# Patient Record
Sex: Male | Born: 1935 | Race: White | Hispanic: No | Smoking: Former smoker
Health system: Southern US, Community
[De-identification: ages and names within clinical notes are randomized; demographics above are authoritative.]

## PROBLEM LIST (undated history)

## (undated) DIAGNOSIS — K635 Polyp of colon: Secondary | ICD-10-CM

## (undated) DIAGNOSIS — M1990 Unspecified osteoarthritis, unspecified site: Secondary | ICD-10-CM

## (undated) DIAGNOSIS — K219 Gastro-esophageal reflux disease without esophagitis: Secondary | ICD-10-CM

## (undated) DIAGNOSIS — H532 Diplopia: Secondary | ICD-10-CM

## (undated) DIAGNOSIS — K649 Unspecified hemorrhoids: Secondary | ICD-10-CM

## (undated) DIAGNOSIS — N400 Enlarged prostate without lower urinary tract symptoms: Secondary | ICD-10-CM

## (undated) HISTORY — DX: Benign prostatic hyperplasia without lower urinary tract symptoms: N40.0

## (undated) HISTORY — DX: Unspecified hemorrhoids: K64.9

## (undated) HISTORY — DX: Gastro-esophageal reflux disease without esophagitis: K21.9

## (undated) HISTORY — PX: HEMORRHOID SURGERY: SHX153

## (undated) HISTORY — DX: Polyp of colon: K63.5

## (undated) HISTORY — DX: Diplopia: H53.2

## (undated) HISTORY — DX: Unspecified osteoarthritis, unspecified site: M19.90

---

## 1992-06-16 HISTORY — PX: HERNIA REPAIR: SHX51

## 2001-06-16 HISTORY — PX: TOTAL KNEE ARTHROPLASTY: SHX125

## 2004-07-31 ENCOUNTER — Ambulatory Visit: Payer: Self-pay | Admitting: Family Medicine

## 2004-08-02 ENCOUNTER — Ambulatory Visit: Payer: Self-pay | Admitting: Internal Medicine

## 2004-11-19 ENCOUNTER — Ambulatory Visit: Payer: Self-pay | Admitting: Internal Medicine

## 2004-12-06 ENCOUNTER — Ambulatory Visit: Payer: Self-pay | Admitting: Internal Medicine

## 2005-01-28 ENCOUNTER — Ambulatory Visit: Payer: Self-pay | Admitting: Internal Medicine

## 2005-02-05 ENCOUNTER — Ambulatory Visit: Payer: Self-pay

## 2005-06-25 ENCOUNTER — Inpatient Hospital Stay (HOSPITAL_COMMUNITY): Admission: RE | Admit: 2005-06-25 | Discharge: 2005-06-29 | Payer: Self-pay | Admitting: Orthopedic Surgery

## 2005-11-24 ENCOUNTER — Ambulatory Visit: Payer: Self-pay | Admitting: Internal Medicine

## 2005-12-15 ENCOUNTER — Ambulatory Visit: Payer: Self-pay | Admitting: Internal Medicine

## 2006-08-03 ENCOUNTER — Ambulatory Visit: Payer: Self-pay | Admitting: Internal Medicine

## 2006-08-10 ENCOUNTER — Ambulatory Visit: Payer: Self-pay | Admitting: Internal Medicine

## 2006-08-10 LAB — CONVERTED CEMR LAB
Basophils Absolute: 0.1 10*3/uL (ref 0.0–0.1)
Basophils Relative: 1.5 % — ABNORMAL HIGH (ref 0.0–1.0)
CO2: 31 meq/L (ref 19–32)
Chloride: 106 meq/L (ref 96–112)
Creatinine, Ser: 0.8 mg/dL (ref 0.4–1.5)
Eosinophils Absolute: 0.1 10*3/uL (ref 0.0–0.6)
GFR calc Af Amer: 119 mL/min
Glucose, Bld: 101 mg/dL — ABNORMAL HIGH (ref 70–99)
HCT: 44.5 % (ref 39.0–52.0)
Hemoglobin: 15.5 g/dL (ref 13.0–17.0)
MCHC: 34.8 g/dL (ref 30.0–36.0)
MCV: 97.1 fL (ref 78.0–100.0)
Monocytes Absolute: 0.3 10*3/uL (ref 0.2–0.7)
Platelets: 212 10*3/uL (ref 150–400)
Potassium: 3.9 meq/L (ref 3.5–5.1)
RDW: 12.2 % (ref 11.5–14.6)
Sodium: 143 meq/L (ref 135–145)
WBC: 7.7 10*3/uL (ref 4.5–10.5)

## 2006-08-11 ENCOUNTER — Encounter: Payer: Self-pay | Admitting: Internal Medicine

## 2006-08-13 ENCOUNTER — Ambulatory Visit: Payer: Self-pay | Admitting: Internal Medicine

## 2007-02-23 ENCOUNTER — Ambulatory Visit: Payer: Self-pay | Admitting: Internal Medicine

## 2007-02-23 DIAGNOSIS — E739 Lactose intolerance, unspecified: Secondary | ICD-10-CM

## 2007-02-23 DIAGNOSIS — N400 Enlarged prostate without lower urinary tract symptoms: Secondary | ICD-10-CM

## 2007-02-23 HISTORY — DX: Lactose intolerance, unspecified: E73.9

## 2007-02-23 HISTORY — DX: Benign prostatic hyperplasia without lower urinary tract symptoms: N40.0

## 2007-02-24 ENCOUNTER — Telehealth (INDEPENDENT_AMBULATORY_CARE_PROVIDER_SITE_OTHER): Payer: Self-pay | Admitting: *Deleted

## 2007-03-26 ENCOUNTER — Ambulatory Visit: Payer: Self-pay | Admitting: Gastroenterology

## 2007-05-04 ENCOUNTER — Encounter: Payer: Self-pay | Admitting: Gastroenterology

## 2007-05-04 ENCOUNTER — Ambulatory Visit: Payer: Self-pay | Admitting: Gastroenterology

## 2007-05-04 ENCOUNTER — Encounter: Payer: Self-pay | Admitting: Internal Medicine

## 2007-05-04 DIAGNOSIS — D126 Benign neoplasm of colon, unspecified: Secondary | ICD-10-CM

## 2007-05-04 HISTORY — DX: Benign neoplasm of colon, unspecified: D12.6

## 2007-08-26 DIAGNOSIS — K649 Unspecified hemorrhoids: Secondary | ICD-10-CM

## 2007-08-26 DIAGNOSIS — M129 Arthropathy, unspecified: Secondary | ICD-10-CM

## 2007-08-26 DIAGNOSIS — K5730 Diverticulosis of large intestine without perforation or abscess without bleeding: Secondary | ICD-10-CM | POA: Insufficient documentation

## 2007-08-26 HISTORY — DX: Arthropathy, unspecified: M12.9

## 2007-08-26 HISTORY — DX: Diverticulosis of large intestine without perforation or abscess without bleeding: K57.30

## 2007-08-26 HISTORY — DX: Unspecified hemorrhoids: K64.9

## 2007-12-22 ENCOUNTER — Ambulatory Visit: Payer: Self-pay | Admitting: Internal Medicine

## 2008-01-18 ENCOUNTER — Ambulatory Visit: Payer: Self-pay | Admitting: Internal Medicine

## 2008-04-24 ENCOUNTER — Ambulatory Visit: Payer: Self-pay | Admitting: Internal Medicine

## 2008-04-26 ENCOUNTER — Telehealth (INDEPENDENT_AMBULATORY_CARE_PROVIDER_SITE_OTHER): Payer: Self-pay | Admitting: *Deleted

## 2008-04-26 LAB — CONVERTED CEMR LAB
BUN: 12 mg/dL (ref 6–23)
CO2: 31 meq/L (ref 19–32)
Calcium: 9.3 mg/dL (ref 8.4–10.5)
Creatinine, Ser: 0.9 mg/dL (ref 0.4–1.5)
Hemoglobin: 17 g/dL (ref 13.0–17.0)
Hgb A1c MFr Bld: 5.6 % (ref 4.6–6.0)
PSA: 0.88 ng/mL (ref 0.10–4.00)
Potassium: 4.4 meq/L (ref 3.5–5.1)
Sodium: 141 meq/L (ref 135–145)

## 2009-06-27 ENCOUNTER — Ambulatory Visit: Payer: Self-pay | Admitting: Internal Medicine

## 2009-06-27 DIAGNOSIS — R9431 Abnormal electrocardiogram [ECG] [EKG]: Secondary | ICD-10-CM

## 2009-06-27 HISTORY — DX: Abnormal electrocardiogram (ECG) (EKG): R94.31

## 2009-06-28 ENCOUNTER — Encounter: Payer: Self-pay | Admitting: Internal Medicine

## 2009-06-28 LAB — CONVERTED CEMR LAB
Bacteria, UA: NONE SEEN
Casts: NONE SEEN /lpf

## 2009-06-29 ENCOUNTER — Ambulatory Visit: Payer: Self-pay | Admitting: Internal Medicine

## 2009-06-29 LAB — CONVERTED CEMR LAB
Basophils Absolute: 0 10*3/uL (ref 0.0–0.1)
Basophils Relative: 0.1 % (ref 0.0–3.0)
Eosinophils Absolute: 0.2 10*3/uL (ref 0.0–0.7)
Lymphocytes Relative: 52.7 % — ABNORMAL HIGH (ref 12.0–46.0)
Lymphs Abs: 3.2 10*3/uL (ref 0.7–4.0)
MCV: 100 fL (ref 78.0–100.0)
Monocytes Absolute: 0.5 10*3/uL (ref 0.1–1.0)
Neutro Abs: 2.2 10*3/uL (ref 1.4–7.7)
Platelets: 160 10*3/uL (ref 150.0–400.0)

## 2009-07-04 ENCOUNTER — Telehealth (INDEPENDENT_AMBULATORY_CARE_PROVIDER_SITE_OTHER): Payer: Self-pay | Admitting: *Deleted

## 2009-07-04 LAB — CONVERTED CEMR LAB
BUN: 18 mg/dL (ref 6–23)
CO2: 21 meq/L (ref 19–32)
Calcium: 9.3 mg/dL (ref 8.4–10.5)
Cholesterol: 171 mg/dL (ref 0–200)
Glucose, Bld: 77 mg/dL (ref 70–99)
HDL: 41 mg/dL (ref >39)
LDL Cholesterol: 109 mg/dL — ABNORMAL HIGH (ref 0–99)
Sodium: 140 meq/L (ref 135–145)
Triglycerides: 104 mg/dL (ref <150)
VLDL: 21 mg/dL (ref 0–40)

## 2009-07-12 ENCOUNTER — Encounter: Payer: Self-pay | Admitting: Internal Medicine

## 2009-09-11 ENCOUNTER — Telehealth (INDEPENDENT_AMBULATORY_CARE_PROVIDER_SITE_OTHER): Payer: Self-pay | Admitting: *Deleted

## 2009-10-01 ENCOUNTER — Ambulatory Visit: Payer: Self-pay | Admitting: Internal Medicine

## 2009-12-18 ENCOUNTER — Ambulatory Visit: Payer: Self-pay | Admitting: Internal Medicine

## 2009-12-18 DIAGNOSIS — R42 Dizziness and giddiness: Secondary | ICD-10-CM

## 2009-12-18 HISTORY — DX: Dizziness and giddiness: R42

## 2010-07-14 LAB — CONVERTED CEMR LAB
Bilirubin Urine: NEGATIVE
Bilirubin Urine: NEGATIVE
Blood in Urine, dipstick: NEGATIVE
Calcium: 9.3 mg/dL (ref 8.4–10.5)
Chloride: 105 meq/L (ref 96–112)
Creatinine, Ser: 0.8 mg/dL (ref 0.4–1.5)
Eosinophils Absolute: 0.2 10*3/uL (ref 0.0–0.6)
GFR calc Af Amer: 119 mL/min
GFR calc non Af Amer: 98 mL/min
Glucose, Bld: 94 mg/dL (ref 70–99)
Glucose, Urine, Semiquant: NEGATIVE
HCT: 46.1 % (ref 39.0–52.0)
HDL: 35.2 mg/dL — ABNORMAL LOW (ref >39.0)
Hemoglobin: 16 g/dL (ref 13.0–17.0)
Hgb A1c MFr Bld: 5.7 % (ref 4.6–6.0)
Ketones, urine, test strip: NEGATIVE
Ketones, urine, test strip: NEGATIVE
LDL Cholesterol: 114 mg/dL — ABNORMAL HIGH (ref 0–99)
MCHC: 34.7 g/dL (ref 30.0–36.0)
Monocytes Relative: 6.7 % (ref 3.0–11.0)
Nitrite: NEGATIVE
PSA: 0.87 ng/mL (ref 0.10–4.00)
Potassium: 4.3 meq/L (ref 3.5–5.1)
Protein, U semiquant: NEGATIVE
RBC: 4.67 M/uL (ref 4.22–5.81)
Sodium: 141 meq/L (ref 135–145)
Specific Gravity, Urine: 1.01
TSH: 2.17 microintl units/mL (ref 0.35–5.50)
Total CHOL/HDL Ratio: 5.2
VLDL: 33 mg/dL (ref 0–40)
WBC Urine, dipstick: NEGATIVE
WBC: 6.4 10*3/uL (ref 4.5–10.5)

## 2010-07-18 NOTE — Assessment & Plan Note (Signed)
Summary: BLOOD PRESSURE IS HIGH//KN   Vital Signs:  Patient profile:   77 year old male Height:      72 inches Weight:      217.50 pounds BMI:     29.60 Pulse rate:   118 / minute Pulse rhythm:   regular BP sitting:   134 / 88  (left arm) Cuff size:   large  Vitals Entered By: Army Fossa CMA (December 18, 2009 2:03 PM) CC: BP has been elevated Comments Last pm- 154/86 This am- 161/91   History of Present Illness: last week, had a single episode of mild dizziness after he stood up from bed. Symptoms quickly subsided but since then he is just not feeling 100% right. He cannot be more specific. At the time of the dizzy spell, he did not have nausea, vomiting, slurred speech or diplopia No further symptoms like that He has been taking his BPs lately, and it has been as high as 161/91.  ROS No chest pain or palpitation No near syncope filling No nausea vomiting or blood in the stools He did have some constipation that self resolve.  Allergies: 1)  ! Pcn  Past History:  Past Medical History: Reviewed history from 06/27/2009 and no changes required. COLONIC POLYPS per Cscope 11-08 ARTHRITIS  HEMORRHOIDS  HEMOCCULT POSITIVE STOOL  IMPAIRED GLUCOSE TOLERANCE  BPH  Past Surgical History: Reviewed history from 04/24/2008 and no changes required. Hemorrhoidectomy Total Knee Arthroplasty L  Social History: Reviewed history from 10/01/2009 and no changes required. Married 3 children, lost older son Retired, upholstery-furniture walks 2 miles a day , very engaged in his church place golf  one- twice a week, he is a very happy person tobacco-- quit 1985 , (pipe, cigars) ETOH-- no  Review of Systems      See HPI  Physical Exam  General:  alert, well-developed, and well-nourished.   Neck:  normal carotid pulses bilaterally Lungs:  normal respiratory effort, no intercostal retractions, no accessory muscle use, and normal breath sounds.   Heart:  tachycardic, seems  regular Abdomen:  soft, non-tender, and no distention.   Extremities:  no edema Neurologic:  alert & oriented X3, cranial nerves II-XII intact, and strength normal in all extremities.   Speech fluent and coherent   Impression & Recommendations:  Problem # 1:  DIZZINESS AND GIDDINESS (ICD-780.4)  nonspecific dizziness last week , very mild, neurological exam negative. EKG today showed sinus rhythm, no change compared to previous Plan: Observation, to call if BP > 140/85 consistently, low salt diet (which he started already)  Orders: EKG w/ Interpretation (93000)  Complete Medication List: 1)  Asa 81 Mg  .[name].. 1 qd 2)  Vitamin C 500 Mg  .[name].. 1 qd 3)  Saw Palmetto  .[name].. 1 qd 4)  Tylenol Ex St Arthritis Pain 500 Mg Tabs (Acetaminophen)  Patient Instructions: 1)  Please schedule a follow-up appointment in 4 months .

## 2010-07-18 NOTE — Assessment & Plan Note (Signed)
Summary: 3 MTH FU/NS/KDC   Vital Signs:  Patient profile:   77 year old male Height:      72 inches Weight:      221.8 pounds Pulse rate:   84 / minute BP sitting:   122 / 80  Vitals Entered By: Kandice Hams (October 01, 2009 8:57 AM) CC: rov, not fasting   History of Present Illness:  routine office visit feels great  Current Medications (verified): 1)  Asa 81 Mg .[name].. 1 Qd 2)  Vitamin C 500 Mg .[name].. 1 Qd 3)  Saw Palmetto .[name].. 1 Qd 4)  Tylenol Ex St Arthritis Pain 500 Mg  Tabs (Acetaminophen)  Allergies (verified): 1)  ! Pcn  Past History:  Past Medical History: Reviewed history from 06/27/2009 and no changes required. COLONIC POLYPS per Cscope 11-08 ARTHRITIS  HEMORRHOIDS  HEMOCCULT POSITIVE STOOL  IMPAIRED GLUCOSE TOLERANCE  BPH  Past Surgical History: Reviewed history from 04/24/2008 and no changes required. Hemorrhoidectomy Total Knee Arthroplasty L  Social History: Married 3 children, lost older son Retired, upholstery-furniture walks 2 miles a day , very engaged in his church place golf  one- twice a week, he is a very happy person tobacco-- quit 1985 , (pipe, cigars) ETOH-- no  Review of Systems       he had BPH symptoms, he was prescribed antibiotics and after that he felt completely well he also saw urology, note is reviewed , they feel that he did not need any treatment. denies chest pain, shortness of breath or palpitations desires a handicapped sticker, he had a total left knee replacement, since then he wakes up often with left leg numbness distal from the knee.  That difficult his gait sometimes  Physical Exam  General:  alert and well-developed.   Lungs:  normal respiratory effort, no intercostal retractions, no accessory muscle use, and normal breath sounds.   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 1 qd 2)  Vitamin C 500 Mg  .[name].. 1 qd 3)  Saw Palmetto  .[name].. 1 qd 4)  Tylenol Ex St Arthritis Pain 500 Mg Tabs (Acetaminophen)  Patient Instructions: 1)  Please schedule a follow-up appointment in 4 to 6 months .

## 2010-07-18 NOTE — Consult Note (Signed)
Summary: [facility]  [facility]   Imported By: Lanelle Bal 07/23/2009 13:48:41  _____________________________________________________________________  External Attachment:    Type:   Image     Comment:   External Document

## 2010-07-18 NOTE — Progress Notes (Signed)
Summary: waiting on signature, 1/21  Phone Note Call from Patient Call back at [facility] Phone [phone]   Caller: Patient Summary of Call: PATIENT BROUGHT IN HANDICAP RENEWAL FORM --PLEASE COMPLETE AND MAIL BACK TO HIM IN THE ENCLOSED ENVELOPE  PLEASE CALL HIM TO TELL HIM WHEN IT HAS BEEN MAILED  TOOK PAPERWORK TO [facility] IN PLASTIC SLEEVE Initial call taken by: Jerolyn Shin,  July 04, 2009 10:33 AM  Follow-up for Phone Call        form in Dr. Drue Novel office to complete [facility]  July 06, 2009 3:52 PM form completed, pt picked up form today Shary Decamp  July 13, 2009 9:36 AM

## 2010-07-18 NOTE — Assessment & Plan Note (Signed)
Summary: yearly checkup/NS/KDC   Vital Signs:  Patient profile:   77 year old male Height:      72 inches Weight:      223 pounds Pulse rate:   60 / minute BP sitting:   130 / 78  (left arm) Cuff size:   large  Vitals Entered By: Shary Decamp (June 27, 2009 12:43 PM) CC: yearly - not fasting Is Patient Diabetic? No Comments  - urinary urgency, frequency  - wakes up 1-2x @ night to urinate  - pt is unable to hold urine Shary Decamp  June 27, 2009 12:47 PM    History of Present Illness: yearly, chart is reviewed  ARTHRITIS -- no major issue w/ pain at present, uses tylenol sporadicaly   IMPAIRED GLUCOSE TOLERANCE  follows a healthy diet, wife has DM exercise : walks 2 miles a day weather permit no ambulatory CBGs   BPH c/o sudden urges  to urinate, urgency, frequency wakes up 1-2x @ night to urinate pt is unable to hold urine has not taken meds for this issues   Preventive Screening-Counseling & Management  Alcohol-Tobacco     Alcohol drinks/day: 0     Smoking Status: quit, smoked pipes, cigars     Year Quit: 1985  Caffeine-Diet-Exercise     Caffeine use/day: 2     Does Patient Exercise: yes, daily  Current Medications (verified): 1)  Asa 81 Mg .[name].. 1 Qd 2)  Vitamin C 500 Mg .[name].. 1 Qd 3)  Saw Palmetto .[name].. 1 Qd 4)  Tylenol Ex St Arthritis Pain 500 Mg  Tabs (Acetaminophen)  Allergies (verified): 1)  ! Pcn  Past History:  Past Medical History: COLONIC POLYPS per Cscope 11-08 ARTHRITIS  HEMORRHOIDS  HEMOCCULT POSITIVE STOOL  IMPAIRED GLUCOSE TOLERANCE  BPH  Past Surgical History: Reviewed history from 04/24/2008 and no changes required. Hemorrhoidectomy Total Knee Arthroplasty L  Family History: CAD--no MI--no DM--no breast ca--M colon cancer--no prostate ca--no  Social History: Reviewed history from 04/24/2008 and no changes required. Married 3 children, lost older son Retired, upholstery-furniture walks 2 miles a day    tobacco-- quit 1985 , (pipe, cigars) ETOH-- no Smoking Status:  quit, smoked pipes, cigars Does Patient Exercise:  yes, daily Caffeine use/day:  2  Review of Systems General:  Denies fever and weight loss. CV:  Denies chest pain or discomfort and swelling of feet. Resp:  Denies cough and shortness of breath. GI:  Denies bloody stools, diarrhea, nausea, and vomiting. GU:  Denies hematuria. Psych:  Denies anxiety and depression.  Physical Exam  General:  alert and well-developed.  alert and well-developed.   Neck:  no masses and normal carotid upstroke.  no masses and normal carotid upstroke.   Lungs:  normal respiratory effort, no intercostal retractions, and no accessory muscle use.  normal respiratory effort, no intercostal retractions, and no accessory muscle use.    Heart:  no murmur and no gallop.  regular rate? Bigeminy? Abdomen:  soft, non-tender, no distention, no masses, no guarding, and no rigidity.  no bruit Rectal:  No external abnormalities noted. Normal sphincter tone. No rectal masses or tenderness. 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 (ICD-600.00) complaining of urinary symptoms, more than obstruction they  seem to be irritative  in nature today the prostate seems enlarged on the left plan:  urology referral UA and urine culture, PSA   antibiotics  Orders: UA Dipstick w/o Micro (manual) (54098) Specimen Handling (99000) T-Urine Microscopic (11914-78295) T-Culture, Urine (62130-86578) Urology Referral (Urology)  Problem # 2:  HEALTH SCREENING (ICD-V70.0) Td 06 had shingles shot 2008 @ [facility] per patient  provided  a pneumonia shot 01-2008 rec flu shot, benefits discussed, "I will let you know"   had a normal bone  density test on March 2006  Cscope 11-08,(+) polyp   Problem # 3:  IMPAIRED GLUCOSE TOLERANCE (ICD-271.3) has a healthy lifestyle, check labs  Problem # 4:  NONSPECIFIC ABNORMAL ELECTROCARDIOGRAM (ICD-794.31) frequent extra systoles, will discuss with cardiology patient asymptomatic, no syncope or near syncope addendum: Discussed with cardiology, as long as the patient is asymptomatic we can observe.  He if patient has symptoms, he will need an echo Orders: EKG w/ Interpretation (93000)  Complete Medication List: 1)  Asa 81 Mg  .[name].. 1 qd 2)  Vitamin C 500 Mg  .[name].. 1 qd 3)  Saw Palmetto  .[name].. 1 qd 4)  Tylenol Ex St Arthritis Pain 500 Mg Tabs (Acetaminophen) 5)  Levaquin 250 Mg Tabs (Levofloxacin) .... 2 tablets the first day, then 1 tablet daily for an additional 9 days  Patient Instructions: 1)  come back fasting: 2)  FLP, TSH   Dx   (ICD-794.31) 3)  CBC Dx (ICD-211.3) 4)  A1C,  BMP  Dx  (ICD-271.3) 5)  PSA Dx  (ICD-600.00) 6)  Please schedule a follow-up appointment in 3 months .  Prescriptions: LEVAQUIN 250 MG TABS (LEVOFLOXACIN) 2 tablets the first day, then 1 tablet daily for an additional 9 days  #11 x 0   Entered and Authorized by:   Nolon Rod. Atalaya Zappia MD   Signed by:   Nolon Rod. Petar Mucci MD on 06/27/2009   Method used:   Print then Give to Patient   RxID:   [id number]    Not Administered:    Influenza Vaccine not given due to: declined    Preventive Care Screening  Prior Values:    PSA:  0.88 (04/24/2008)    Last Tetanus Booster:  Historical (07/31/2004)    Last Pneumovax:  Pneumovax (01/18/2008)   Laboratory Results   Urine Tests    Routine Urinalysis   Glucose: negative   (Normal Range: Negative) Bilirubin: negative   (Normal Range: Negative) Ketone: negative   (Normal Range: Negative) Spec. Gravity: 1.020   (Normal Range: 1.003-1.035) Blood: negative   (Normal Range: Negative) pH: 5.0   (Normal Range: 5.0-8.0) Protein: negative   (Normal Range:  Negative) Urobilinogen: 0.2   (Normal Range: 0-1) Nitrite: negative   (Normal Range: Negative) Leukocyte Esterace: negative   (Normal Range: Negative)

## 2010-07-18 NOTE — Progress Notes (Signed)
Summary: HANDICAP PLACARD FORM TO COMPLETE  Phone Note Call from Patient Call back at Home Phone [phone]   Caller: Spouse Summary of Call: PATIENT'S WIFE DROPPED OFF HANDICAP PLACARD RENEWAL FORM----PLEASE CALL AT [phone] WHEN READY FOR PICKUP  WILL TAKE FORM TO [facility] IN PLASTIC SLEEVE Initial call taken by: Jerolyn Shin,  September 11, 2009 10:07 AM  Follow-up for Phone Call        Spoke with pt wife informed pt has ov 10/01/09 with Dr Drue Novel, can assess and fill out then,  Placard expires 11/13/2009  wife agreed .Kandice Hams  September 11, 2009 10:29 AM  Follow-up by: Kandice Hams,  September 11, 2009 10:29 AM

## 2010-07-18 NOTE — Consult Note (Signed)
Summary: Page 3/[facility]  Page 3/[facility]   Imported By: Lanelle Bal 08/01/2009 13:21:22  _____________________________________________________________________  External Attachment:    Type:   Image     Comment:   External Document

## 2010-09-26 ENCOUNTER — Ambulatory Visit (INDEPENDENT_AMBULATORY_CARE_PROVIDER_SITE_OTHER): Payer: Medicare Other | Admitting: Internal Medicine

## 2010-09-26 ENCOUNTER — Encounter: Payer: Self-pay | Admitting: Internal Medicine

## 2010-09-26 VITALS — BP 118/72 | HR 94 | Temp 98.5°F | Wt 222.4 lb

## 2010-09-26 DIAGNOSIS — M25519 Pain in unspecified shoulder: Secondary | ICD-10-CM

## 2010-09-26 DIAGNOSIS — M25512 Pain in left shoulder: Secondary | ICD-10-CM

## 2010-09-26 MED ORDER — TRAMADOL HCL 50 MG PO TABS: 50.0000 mg | ORAL_TABLET | Freq: Four times a day (QID) | ORAL | Status: DC | PRN

## 2010-09-26 NOTE — Patient Instructions (Signed)
Please use the tramadol every 6 hours as needed for pain. Continue the [facility] at night. The ibuprofen could cause significant gastritis or even ulcer if taken on an empty stomach, especially at night. Please take glucosamine sulfate 1500 mg daily until the shoulder pain is gone. If the symptoms persist then orthopedic referral or physical therapy referral  will be made.

## 2010-09-26 NOTE — Progress Notes (Signed)
Subjective:    Patient ID: Kevin Glass, male    DOB: 30-May-1923, 77 y.o.   MRN: [id number]  HPI    Location: over the L shoulder @ neck level  Onset: 7 days ago  Severity: 8 on 10 scale Pain is described as: aching  Worse with: no exacerbating  Position or activity Better with: NSAIDS & Icy Hot help overnight  Pain radiates to: occasionally to  L upper arm  Impaired range of motion: no  History of repetitive motion:  no  History of overuse or hyperextension:  no  History of trauma:  no   Past history of similar problem:  no   Symptoms Back Pain:  no  Numbness/tingling:  no  Weakness:  no  Red Flags Fever:  no  Headache:  no  Bowel/bladder dysfunction:  no Rash or lesions : no     Review of Systems      Objective:   Physical Exam General appearance is one of good health and nourishment.Appearsyounger than age Skull is normocephalic without lymphadenopathy about the head, neck, or axilla. See current vital signs Eye - Pupils Equal Round Reactive to light, Extraocular movements intact,Neck:  No deformities, thyromegaly, masses, or tenderness noted.   Supple with full range of motion without pain. Heart:  Normal rate and regular rhythm. S1 and S2 normal without gallop, murmur, click, rub or other extra sounds. Lungs:Chest clear to auscultation; no wheezes, rhonchi,rales ,or rubs present.No increased work of breathing. Crepitus L shoulder with ROM. Full ROM of  UE. No skin lesions L shoulder area.Increased lordotic curve upper thoracic spine. Neurologic exam : Cn 2-7 intact Strength equal & normal in upper & lower extremities Gait normal.  Assessment & Plan: #1 left shoulder pain with some crepitus. There is no definite neuromuscular deficit noted. Plan: #1 tramadol will be prescribed for the shoulder. If the symptoms persist then physical therapy or orthopedic referral would be recommended.  Plan #1

## 2010-10-29 NOTE — Assessment & Plan Note (Signed)
[name]                         GASTROENTEROLOGY OFFICE NOTE   Kevin Glass, Kevin Glass                       MRN:          [id number]  DATE:03/26/2007                            DOB:          1922-12-08    REASON FOR CONSULTATION:  Screening colonoscopy.   Kevin Glass is a pleasant 77 year old white male referred through the  courtesy of Dr. Drue Novel for evaluation.  Except for occasional hemorrhoids  causing rectal discomfort, he has no GI complaints.  Specifically, he is  without change of bowel habits, abdominal pain, melena, or hematochezia.   PAST MEDICAL HISTORY:  Pertinent for:  1. Arthritis.  2. He is status post knee replacement.  3. Herniorrhaphy.   FAMILY HISTORY:  Pertinent for mother with breast cancer.   He takes a baby aspirin a day.   He is allergic to PENICILLIN.   He neither smokes nor drinks.  He is married and retired.   REVIEW OF SYSTEMS:  Reviewed and is negative.   PHYSICAL EXAMINATION:  VITAL SIGNS:  Pulse 100, blood pressure 120/80,  weight 228.  HEENT: EOMI.  PERRLA.  Sclerae are anicteric.  Conjunctivae are pink.  NECK:  Supple without thyromegaly, adenopathy or carotid bruits.  CHEST:  Clear to auscultation and percussion without adventitious  sounds.  CARDIAC:  Regular rhythm; normal S1 S2.  There are no murmurs, gallops  or rubs.  ABDOMEN:  Bowel sounds are normoactive.  Abdomen is soft, nontender and  nondistended.  There are no abdominal masses, tenderness, splenic  enlargement or hepatomegaly.  EXTREMITIES:  Full range of motion.  No cyanosis, clubbing or edema.  RECTAL:  Deferred.   ASSESSMENT/PLAN:  I will schedule Kevin Glass for screening colonoscopy.     Kevin Glass. Kevin Dice, MD,FACG  Electronically Signed   RDK/MedQ  DD: 03/26/2007  DT: 03/26/2007  Job #: [id number]   cc:   Kevin Ora, MD

## 2010-10-29 NOTE — Letter (Signed)
March 26, 2007    Mr. Vega Withrow   RE:  RILEY, HALLUM  MRN:  [id number]  /  DOB:  1922/09/28   Dear Mr. Juline Patch:   It is my pleasure to have treated you recently as a new patient in my  office.  I appreciate your confidence and the opportunity to participate  in your care.   Since I do have a busy inpatient endoscopy schedule and office schedule,  my office hours vary weekly.  I am, however, available for emergency  calls every day through my office.  If I cannot promptly meet an urgent  office appointment, another one of our gastroenterologists will be able  to assist you.   My well-trained staff are prepared to help you at all times.  For  emergencies after office hours, a physician from our gastroenterology  section is always available through my 24-hour answering service.   While you are under my care, I encourage discussion of your questions  and concerns, and I will be happy to return your calls as soon as I am  available.   Once again, I welcome you as a new patient and I look forward to a happy  and healthy relationship.    Sincerely,      Barbette Hair. Arlyce Dice, MD,FACG  Electronically Signed   RDK/MedQ  DD: 03/26/2007  DT: 03/26/2007  Job #: [id number]

## 2010-10-29 NOTE — Letter (Signed)
March 26, 2007    Willow Ora, MD  [phone] [place], [place] [address]   RE:  [name], [name]  MRN:  [id number]  /  DOB:  [birth year]   Dear Dr. Drue Novel:   Upon your kind referral, I had the pleasure of evaluating your patient  and I am pleased to offer my findings.  I saw Kevin Glass in the  office today.  Enclosed is a copy of my progress note that details my  findings and recommendations.   Thank you for the opportunity to participate in your patient's care.    Sincerely,      Barbette Hair. Arlyce Dice, MD,FACG  Electronically Signed    RDK/MedQ  DD: 03/26/2007  DT: 03/26/2007  Job #: [id number]

## 2010-11-01 NOTE — H&P (Signed)
NAMERILEY, HALLUM NO.:  [id number]   MEDICAL RECORD NO.:  [id number]          PATIENT TYPE:  INP   LOCATION:  NA                           FACILITY:  [facility]   PHYSICIAN:  Ollen Gross, M.D.    DATE OF BIRTH:  1923-05-03   DATE OF ADMISSION:  06/25/2005  DATE OF DISCHARGE:                                HISTORY & PHYSICAL   DATE OF OFFICE VISIT HISTORY AND PHYSICAL:  June 17, 2005.   CHIEF COMPLAINT:  Left knee pain.   HISTORY OF PRESENT ILLNESS:  The patient is an 77 year old male seen by Dr.  Lequita Halt for ongoing left knee problems.  He has been treated conservatively  in the past including injections and a recent series of Hyalgan injections.  Unfortunately, he did not get much benefit from the injections.  He is at a  point where he has known end-stage arthritis.  He is unable to do some of  the things he enjoys.  He is felt to be a candidate for knee replacement.  The risks and benefits have been discussed.  The patient is subsequently  admitted to the hospital.   ALLERGIES:  PENICILLIN which causes blisters.   CURRENT MEDICATIONS:  1.  Glucosamine.  2.  Vitamins.  3.  Aspirin.  4.  Saw palmetto.  5.  Arthritis pain reliever.   PAST MEDICAL HISTORY:  1.  Past history of walking pneumonia back in 1985.  2.  Osteoarthritis.   PAST SURGICAL HISTORY:  1.  Inguinal hernia repair in 1975.  2.  Surface hernia repair in 1978.   SOCIAL HISTORY:  Married.  Retired.  Nonsmoker.  No alcohol.  Two living  children; one deceased.  Wife will be assisting with his care after surgery.   FAMILY HISTORY:  Significant for breast cancer with mother.   REVIEW OF SYSTEMS:  GENERAL:  No fevers, chills or night sweats.  NEUROLOGIC:  No seizures, syncope or paralysis.  RESPIRATORY:  No shortness  of breath, cough or hemoptysis.  CARDIOVASCULAR:  No chest pain, angina or  orthopnea.  GI:  No nausea, vomiting, diarrhea or constipation.  GU:  No  dysuria, hematuria  or discharge.  MUSCULOSKELETAL:  Left knee as found in  the history of present illness.   PHYSICAL EXAMINATION:  VITAL SIGNS:  Pulse of 88, respirations 14, blood  pressure 112/82.  GENERAL:  An 76 year old white male, well-nourished, well-developed, in no  acute distress.  He is alert, oriented and cooperative and very pleasant at  the time of the exam.  He is a good historian.  HEENT:  Normocephalic and atraumatic.  Pupils round and reactive.  Oropharynx clear.  Extraocular movements intact.  NECK:  Supple.  CHEST:  Clear, anterior and posterior chest walls.  No rhonchi, rales or  wheezing.  HEART:  Regular rate and rhythm.  No murmurs.  ABDOMEN:  Soft, nontender.  Bowel sounds present.  RECTAL/BREAST/GENITALIA:  Not done.  Not pertinent to present illness.  EXTREMITIES:  Left knee shows passive range of motion up to 115 degrees.  Mild crepitus is noted.  He  has a varus deformity and malalignment.   IMPRESSION:  Osteoarthritis, left knee.   PLAN:  The patient will be admitted to [facility] to undergo a  left total hip arthroplasty.  The surgery will be performed by Dr. Ollen Gross.  Medical doctor, Dr. Drue Novel, will be notified of the room number on  admission and will be consulted if needed for medical assistance for the  patient throughout the hospital course.      Alexzandrew L. Julien Girt, P.A.      Ollen Gross, M.D.  Electronically Signed    ALP/MEDQ  D:  06/24/2005  T:  06/25/2005  Job:  [id number]   cc:   Wanda Plump, MD [facility]  [phone] [place], [place] [address]

## 2010-11-01 NOTE — Discharge Summary (Signed)
[name], REWERTS NO.:  [id number]   MEDICAL RECORD NO.:  [id number]          PATIENT TYPE:  INP   LOCATION:  1515                         FACILITY:  [facility]   PHYSICIAN:  Ollen Gross, M.D.    DATE OF BIRTH:  06-Aug-1922   DATE OF ADMISSION:  06/25/2005  DATE OF DISCHARGE:  06/29/2005                                 DISCHARGE SUMMARY   ADMISSION DIAGNOSES:  1.  Osteoarthritis left knee.  2.  History of walking pneumonia back in 1985.   DISCHARGE DIAGNOSES:  1.  Osteoarthritis left knee status post left total knee arthroplasty.  2.  History of walking pneumonia back in 1985.  3.  Postoperative hyponatremia.   PROCEDURE:  June 25, 2005 left total knee, surgeon Dr. Lequita Halt, surgeon  Avel Peace, PA-C, spinal anesthesia. Tourniquet time 43 minutes.   CONSULTATIONS:  None.   BRIEF HISTORY:  Mr. Kevin Glass is an 77 year old male with severe end-stage  arthritis left greater than right and knee pain. It has been refractory to  nonoperative management including injections and now presents for left total  knee.   LABORATORY DATA:  Preop CBC, hemoglobin of 16.8, hematocrit of 49.4. Postop  hemoglobin of 14.1, last noted H&H 12.3 and 34.9. PT and PTT preop, 14.1 and  32 respectively. Serial pro times followed. Last noted PT and INR 22.6 and  2.0. Chem panel on admission all within normal limits. Serial BMETs are  followed. Sodium dropped from 141 to 133, last noted at 133. Glucose went up  from 111 to 154 back down to 139. The remaining electrolytes remained within  normal limits. Urinalysis preop negative, blood group type O positive. Preop  EKG June 23, 2005 normal sinus rhythm, possible left atrial enlargement,  no previous EKG is confirmed by Dr. Dawson Bing. Two view chest preop,  heart size prominent with dilatation of aorta, thought to be scarring in the  right upper lobe and both lower lobes. Comparison of any previous chest x-  ray would be  suggested.   HOSPITAL COURSE:  The patient admitted to [facility], tolerated  procedure well, later transferred to the recovery room and then the  orthopedic floor. Started on PCA and p.o. analgesics for pain control. Day  1, he was actually doing pretty well, was able to get some rest through that  night, started to get up with physical therapy, dressing looked good. By day  2, he progressed very well with physical therapy ambulating greater than 90  feet. Hemoglobin looked good at 13.1, dressing was changed, incision looked  excellent. He was progressing well and felt that he would be able to go home  very soon. Weaned over to p.o. medications. By day 3, he was feeling well,  tolerating his medications, up ambulating approximately 160 to nearly 200  feet but he had some sluggish bowel sounds. Encouraged continued therapy. By  the following day, he had been up with therapy, tolerating his meds, no  complaints, minimal swelling and was discharged home.   PLAN:  1.  The patient was discharged home on June 29, 2005.  2.  Discharge diagnoses - please see above.  3.  Discharge medications - Coumadin, Percocet, Robaxin.  4.  Diet - as tolerated.  5.  Activity - weightbearing as tolerated. Gait training, ambulation, ADLs,      as per PT and OT, home health physical therapy, home health nursing.      Total knee protocol. Followup 2 weeks from surgery.   DISPOSITION:  Home.   CONDITION ON DISCHARGE:  Improved.      Kevin Glass, P.A.      Ollen Gross, M.D.  Electronically Signed    ALP/MEDQ  D:  08/14/2005  T:  08/15/2005  Job:  [id number]   cc:   Wanda Plump, MD [facility]  [phone] [place], [place] [address]

## 2010-11-01 NOTE — Op Note (Signed)
[name], Glass NO.:  [id number]   MEDICAL RECORD NO.:  [id number]          PATIENT TYPE:  INP   LOCATION:  0006                         FACILITY:  [facility]   PHYSICIAN:  Ollen Gross, M.D.    DATE OF BIRTH:  06-27-1922   DATE OF PROCEDURE:  06/25/2005  DATE OF DISCHARGE:                                 OPERATIVE REPORT   PREOPERATIVE DIAGNOSIS:  Osteoarthritis of the left knee.   POSTOPERATIVE DIAGNOSIS:  Osteoarthritis of the left knee.   PROCEDURE:  Left total knee arthroplasty.   SURGEON:  Ollen Gross, M.D.   ASSISTANT:  Alexzandrew L. Julien Girt, P.A.   ANESTHESIA:  Spinal.   ESTIMATED BLOOD LOSS:  Minimal.   DRAINS:  Hemovac x1.   TOURNIQUET TIME:  43 minutes at 300 mmHg.   COMPLICATIONS:  None.   CONDITION:  Stable to recovery.   BRIEF CLINICAL NOTE:  Kevin Glass is an 77 year old male with severe end-  stage arthritis, left greater than right knee with intractable pain in the  left knee which has been refractory to nonoperative management including  injections. He presents now for left total knee arthroplasty.   PROCEDURE IN DETAIL:  After successful administration of spinal anesthetic,  a tourniquet was placed high on the left thigh and left lower extremity  prepped and draped in the usual sterile fashion. Extremities wrapped in  Esmarch, knee flexed, tourniquet inflated to 300 mmHg. A standard midline  incision is made with a 10 blade through the subcutaneous tissue to the  level of the extensor mechanism. A fresh blade is used to make a medial  parapatellar arthrotomy and then the soft tissue over the proximal medial  tibia is subperiosteally elevated to the joint line with the knife and into  the semimembranosus bursa with a Cobb elevator. The soft tissue over the  proximal lateral tibia is elevated with attention being paid to avoiding the  patellar tendon on the tibial tubercle. The patella is subluxed laterally,  knee flexed 90  degrees and ACL and PCL removed. A drill was used to create a  starting hole in the distal femur and canal was irrigated. A 5 degree left  valgus alignment guide is placed and referencing off the posterior condyles,  rotation is marked and the block pinned to remove 10 mm off the distal  femur. Distal femoral resection is made with an oscillating saw. A sizing  block shows that a size 5 is the most appropriate. The rotation is marked  off the epicondylar axis and then the size 5 cutting block is placed. The  anterior, posterior and chamfer cuts are made.   The tibia is subluxed forward and the menisci removed. Extramedullary tibial  alignment guide is placed referencing proximally at the medial aspect of the  tibial tubercle and distally along the second metatarsal axis and tibial  crest. The block is pinned to remove 10 mm from the nondeficient lateral  side. This did not get down to the base of the medial defect so we went down  below that. The tibial resection is made with an oscillating  saw. A size 5  is the most appropriate tibial component and then the proximal tibia is  prepared with a modular drill and keel punch for a size five. Femoral  preparation is completed with the intercondylar cut.   The size 5 mobile bearing tibial trial with the size 5 posterior stabilized  femoral trial and a 12.5 mm posterior stabilized rotating platform insert  trial are placed. With the 12.5, we still had a tiny bit of hyperextension  so I went to a 15 which allowed for full extension with excellent varus and  valgus balance throughout full range of motion. The patella was then  everted, thickness measured to be 25 mm. Freehand resection is taken to 14  mm, a 41 template is placed, lug holes are drilled, trial patella is placed  and it tracks normally. Osteophytes are then removed off the posterior femur  with the trial in place. All trials are removed and the cut bone surfaces  prepared with  pulsatile lavage. The cement is mixed and once ready for  implantation the size 5 mobile bearing tibial tray, size 5 posterior  stabilized femur and 41 patella are cemented into place and the patella is  held with a clamp. A trial 15 insert is placed, knee held in full extension  and all extruded cement removed. Once the cement is fully hardened then the  permanent 15 mm posterior stabilized rotating platform insert is placed into  the tibial tray. The wound was copiously irrigated with saline solution and  the extensor mechanism closed over a Hemovac drain with interrupted #1 PDS.  Flexion against gravity is 140 degrees. The tourniquet is released with a  total time of 43 minutes. The subcu is closed with interrupted 2-0 Vicryl  and subcuticular with running 4-0 Monocryl. The drain is hooked to suction,  incision cleaned and dried and Steri-Strips and a bulky sterile dressing  applied. He is placed into a knee immobilizer, awakened and transported to  recovery in stable condition.      Ollen Gross, M.D.  Electronically Signed     FA/MEDQ  D:  06/25/2005  T:  06/26/2005  Job:  [id number]

## 2010-11-21 ENCOUNTER — Ambulatory Visit (INDEPENDENT_AMBULATORY_CARE_PROVIDER_SITE_OTHER): Payer: Medicare Other | Admitting: Internal Medicine

## 2010-11-21 ENCOUNTER — Encounter: Payer: Self-pay | Admitting: Internal Medicine

## 2010-11-21 DIAGNOSIS — J069 Acute upper respiratory infection, unspecified: Secondary | ICD-10-CM | POA: Insufficient documentation

## 2010-11-21 HISTORY — DX: Acute upper respiratory infection, unspecified: J06.9

## 2010-11-21 MED ORDER — AZELASTINE HCL 0.15 % NA SOLN: NASAL | Status: DC

## 2010-11-21 NOTE — Patient Instructions (Signed)
Rest, fluids , tylenol For cough, take Mucinex DM twice a day as needed  Take astepro 2 sprays at each side of the nose at night x few days (sample and a prescription) Call if no better in few days Call anytime if the symptoms are severe, you have high fever, short of breath

## 2010-11-21 NOTE — Progress Notes (Signed)
  Subjective:    Patient ID: Tsuneo Faison, male    DOB: 04-08-1923, 77 y.o.   MRN: [id number]  HPI Started with sore throat 3 days ago, symptoms are now progressing and he has head and sinus congestion, postnasal drip, denies any chest congestion. Sore throat is actually slightly better today.  Past Medical History  Diagnosis Date  . Colonic polyp   . Arthritis   . Hemorrhoid   . BPH (benign prostatic hypertrophy)    Past Surgical History  Procedure Date  . Total knee arthroplasty 2003    Dr Despina Hick  . Hemorrhoid surgery      Review of Systems  Constitutional: Negative for fever.  Respiratory: Cough: mild cough, small sputum, no hemoptysis.   Gastrointestinal: Negative for nausea, vomiting and diarrhea.  Musculoskeletal: Negative for myalgias.       Objective:   Physical Exam  Constitutional: He appears well-developed and well-nourished. No distress.  HENT:  Head: Normocephalic and atraumatic.  Right Ear: External ear normal.  Left Ear: External ear normal.  Mouth/Throat: No oropharyngeal exudate.       Not tender to palpation of the face ;nose congested   Cardiovascular: Normal rate, regular rhythm and normal heart sounds.   No murmur heard. Pulmonary/Chest: Effort normal and breath sounds normal. No respiratory distress. He has no wheezes. He has no rales.  Skin: He is not diaphoretic.          Assessment & Plan:

## 2010-11-21 NOTE — Assessment & Plan Note (Signed)
Symptoms consistent with a URI, seen instructions

## 2010-11-29 ENCOUNTER — Emergency Department (HOSPITAL_BASED_OUTPATIENT_CLINIC_OR_DEPARTMENT_OTHER)
Admission: EM | Admit: 2010-11-29 | Discharge: 2010-11-29 | Disposition: A | Discharge status: Home / Self Care | Payer: Medicare Other | Attending: Emergency Medicine | Admitting: Emergency Medicine

## 2010-11-29 ENCOUNTER — Emergency Department (INDEPENDENT_AMBULATORY_CARE_PROVIDER_SITE_OTHER): Payer: Medicare Other

## 2010-11-29 DIAGNOSIS — I517 Cardiomegaly: Secondary | ICD-10-CM

## 2010-11-29 DIAGNOSIS — R05 Cough: Secondary | ICD-10-CM

## 2010-11-29 DIAGNOSIS — R0989 Other specified symptoms and signs involving the circulatory and respiratory systems: Secondary | ICD-10-CM

## 2010-11-29 DIAGNOSIS — J069 Acute upper respiratory infection, unspecified: Secondary | ICD-10-CM | POA: Insufficient documentation

## 2010-12-23 ENCOUNTER — Ambulatory Visit (INDEPENDENT_AMBULATORY_CARE_PROVIDER_SITE_OTHER): Payer: Medicare Other | Admitting: Family Medicine

## 2010-12-23 ENCOUNTER — Encounter: Payer: Self-pay | Admitting: Family Medicine

## 2010-12-23 VITALS — BP 116/80 | HR 103 | Temp 99.4°F | Wt 217.0 lb

## 2010-12-23 DIAGNOSIS — J069 Acute upper respiratory infection, unspecified: Secondary | ICD-10-CM

## 2010-12-23 NOTE — Progress Notes (Signed)
  Subjective:     Kevin Glass is a 77 y.o. male who presents for evaluation of symptoms of a URI. Symptoms include post nasal drip and sneezing. Onset of symptoms was 1 month ago, and has been unchanged since that time. Treatment to date: antibiotics and cough suppressants.  The following portions of the patient's history were reviewed and updated as appropriate: allergies, current medications, past family history, past medical history, past social history, past surgical history and problem list.  Review of Systems Pertinent items are noted in HPI.   Objective:    BP 116/80  Pulse 103  Temp(Src) 99.4 F (37.4 C) (Oral)  Wt 217 lb (98.431 kg)  SpO2 96% General appearance: alert and cooperative Ears: normal TM's and external ear canals both ears Nose: Nares normal. Septum midline. Mucosa normal. No drainage or sinus tenderness. Throat: abnormal findings: mild oropharyngeal erythema and pnd Neck: mild anterior cervical adenopathy and thyroid not enlarged, symmetric, no tenderness/mass/nodules Lungs: clear to auscultation bilaterally   Assessment:    viral upper respiratory illness   Plan:    Discussed diagnosis and treatment of URI. Suggested symptomatic OTC remedies. Nasal saline spray for congestion. Follow up as needed. astepro

## 2010-12-23 NOTE — Patient Instructions (Signed)
Common Cold, Adult An upper respiratory tract infection, or cold, is a viral infection of the air passages to the lung. Colds are contagious, especially during the first 3 or 4 days. Antibiotics cannot cure a cold. Cold germs are spread by coughs, sneezes, and hand to hand contact. A respiratory tract infection usually clears up in a few days, but some people may be sick for a week or two. HOME CARE INSTRUCTIONS  Only take over-the-counter or prescription medicines for pain, discomfort, or fever as directed by your caregiver.   Be careful not to blow your nose too hard. This may cause a nosebleed.   Use a cool-mist humidifier (vaporizer) to increase air moisture. This will make it easier for you to breath. Do not use hot steam.   Rest as much as possible and get plenty of sleep.   Wash your hands often, especially after you blow your nose. Cover your mouth and nose with a tissue when you sneeze or cough.   Drink at least 8 glasses of clear liquids every day, such as water, fruit juices, tea, clear soups, and carbonated beverages.  SEEK MEDICAL CARE IF:  An oral temperature above 100.4 lasts 4 days or more, and is not controlled by medication.   You have a sore throat that gets worse or you see white or yellow spots in your throat.   Your cough gets worse or lasts more than 10 days.   You have a rash somewhere on your skin. You have large and tender lumps in your neck.   You have an earache or a headache.   You have thick, greenish or yellowish discharge from your nose.   You cough-up thick yellow, green, gray or bloody mucus (secretions).  SEEK IMMEDIATE MEDICAL CARE IF: You have trouble breathing, chest pain, or your skin or nails look gray or blue. MAKE SURE YOU:   Understand these instructions.   Will watch your condition.   Will get help right away if you are not doing well or get worse.  Document Released: 05/30/2000 Document Re-Released: 05/15/2008 [facility] Patient  Information 2011 [place], [place].  OK to take otc antihistamine like claritin , zyrtec for 10 days or so. Start astepro again and call or return to office if symptoms persist.

## 2011-05-19 ENCOUNTER — Telehealth: Payer: Self-pay | Admitting: Internal Medicine

## 2011-05-19 NOTE — Telephone Encounter (Signed)
Ok with me 

## 2011-05-19 NOTE — Telephone Encounter (Signed)
My practice is closed; the doctors will continue to cross cover for each other as a team in case of absence however.

## 2011-05-20 NOTE — Telephone Encounter (Signed)
Patient notified of dr hoppers response -

## 2011-05-20 NOTE — Telephone Encounter (Signed)
Left message to call office

## 2011-08-04 ENCOUNTER — Ambulatory Visit (INDEPENDENT_AMBULATORY_CARE_PROVIDER_SITE_OTHER): Payer: Medicare Other | Admitting: Family Medicine

## 2011-08-04 ENCOUNTER — Encounter: Payer: Self-pay | Admitting: Family Medicine

## 2011-08-04 VITALS — BP 112/70 | HR 103 | Temp 98.6°F | Wt 226.6 lb

## 2011-08-04 DIAGNOSIS — J40 Bronchitis, not specified as acute or chronic: Secondary | ICD-10-CM

## 2011-08-04 DIAGNOSIS — R05 Cough: Secondary | ICD-10-CM

## 2011-08-04 DIAGNOSIS — J069 Acute upper respiratory infection, unspecified: Secondary | ICD-10-CM

## 2011-08-04 DIAGNOSIS — R059 Cough, unspecified: Secondary | ICD-10-CM

## 2011-08-04 MED ORDER — AZELASTINE-FLUTICASONE 137-50 MCG/ACT NA SUSP: 1.0000 | Freq: Two times a day (BID) | NASAL | Status: DC

## 2011-08-04 MED ORDER — GUAIFENESIN-CODEINE 100-10 MG/5ML PO SYRP: ORAL_SOLUTION | ORAL | Status: DC

## 2011-08-04 MED ORDER — AZITHROMYCIN 250 MG PO TABS: ORAL_TABLET | ORAL | Status: AC

## 2011-08-04 NOTE — Progress Notes (Signed)
  Subjective:     Shanan Mcmiller is a 78 y.o. male who presents for evaluation of sinus pain. Symptoms include: clear rhinorrhea, congestion, cough, nasal congestion and sneezing. Onset of symptoms was 4 days ago. Symptoms have been gradually worsening since that time. Past history is significant for no history of pneumonia or bronchitis. Patient is a non-smoker.  The following portions of the patient's history were reviewed and updated as appropriate: allergies, current medications, past family history, past medical history, past social history, past surgical history and problem list.  Review of Systems Pertinent items are noted in HPI.   Objective:    BP 112/70  Pulse 103  Temp(Src) 98.6 F (37 C) (Oral)  Wt 226 lb 9.6 oz (102.785 kg)  SpO2 95% General appearance: alert, cooperative, appears stated age and no distress Ears: normal TM's and external ear canals both ears Nose: Nares normal. Septum midline. Mucosa normal. No drainage or sinus tenderness. Throat: lips, mucosa, and tongue normal; teeth and gums normal Neck: mild anterior cervical adenopathy, supple, symmetrical, trachea midline and thyroid not enlarged, symmetric, no tenderness/mass/nodules Lungs: rhonchi RLL Heart: S1, S2 normal    Assessment:    Acute bronchitis   Plan:    Nasal steroids per medication orders. Zithromax per medication orders. cheratussin  rto prn

## 2011-08-04 NOTE — Patient Instructions (Signed)

## 2011-09-18 ENCOUNTER — Other Ambulatory Visit: Payer: Self-pay | Admitting: Internal Medicine

## 2011-09-18 NOTE — Telephone Encounter (Signed)
OK to send in refill for nasal spray & z-pak? Please advise.

## 2011-09-18 NOTE — Telephone Encounter (Signed)
Discussed with pt & scheduled appt for tomorrow w/ Dr. Laury Axon.

## 2011-09-18 NOTE — Telephone Encounter (Signed)
No abx without being seen---ok to send others

## 2011-09-18 NOTE — Telephone Encounter (Signed)
Patient called & states he has the same issues as he did on 2.18 & would like the Nasal Spray  Azelastine-Fluticasone 137-50 MCG/ACT Place 1 spray into the nose 2 (two) times daily. & Z-pac (azithromycin (ZITHROMAX Z-PAK) 250 MG tablet 6 each 0 08/04/2011 08/09/2011 As directed)   called in [facility] to [facility] [address] Can call patient at [phone]

## 2011-09-19 ENCOUNTER — Encounter: Payer: Self-pay | Admitting: Family Medicine

## 2011-09-19 ENCOUNTER — Ambulatory Visit (INDEPENDENT_AMBULATORY_CARE_PROVIDER_SITE_OTHER): Payer: Medicare Other | Admitting: Family Medicine

## 2011-09-19 VITALS — BP 114/74 | HR 84 | Temp 98.3°F | Wt 219.8 lb

## 2011-09-19 DIAGNOSIS — J40 Bronchitis, not specified as acute or chronic: Secondary | ICD-10-CM

## 2011-09-19 MED ORDER — AZITHROMYCIN 250 MG PO TABS: ORAL_TABLET | ORAL | Status: DC

## 2011-09-19 MED ORDER — GUAIFENESIN-CODEINE 100-10 MG/5ML PO SYRP: ORAL_SOLUTION | ORAL | Status: DC

## 2011-09-19 NOTE — Patient Instructions (Signed)

## 2011-09-19 NOTE — Progress Notes (Signed)
  Subjective:     Kevin Glass is a 78 y.o. male here for evaluation of a cough. Onset of symptoms was 5 days ago. Symptoms have been gradually worsening since that time. The cough is productive and is aggravated by dust, exercise and reclining position. Associated symptoms include: chills, postnasal drip and wheezing. Patient does not have a history of asthma. Patient does not have a history of environmental allergens. Patient has not traveled recently. Patient does not have a history of smoking. Patient has not had a previous chest x-ray. Patient has not had a PPD done.  The following portions of the patient's history were reviewed and updated as appropriate: allergies, current medications, past family history, past medical history, past social history, past surgical history and problem list.  Review of Systems Pertinent items are noted in HPI.    Objective:    Oxygen saturation 97% on room air BP 114/74  Pulse 84  Temp(Src) 98.3 F (36.8 C) (Oral)  Wt 219 lb 12.8 oz (99.701 kg)  SpO2 97% General appearance: alert, cooperative, appears stated age and no distress Ears: normal TM's and external ear canals both ears Nose: clear discharge, mild congestion, turbinates red, swollen, no sinus tenderness Throat: lips, mucosa, and tongue normal; teeth and gums normal Neck: mild anterior cervical adenopathy, supple, symmetrical, trachea midline and thyroid not enlarged, symmetric, no tenderness/mass/nodules Lungs: clear to auscultation bilaterally Extremities: extremities normal, atraumatic, no cyanosis or edema    Assessment:    Acute Bronchitis    Plan:    Antibiotics per medication orders. Antitussives per medication orders. Avoid exposure to tobacco smoke and fumes. Call if shortness of breath worsens, blood in sputum, change in character of cough, development of fever or chills, inability to maintain nutrition and hydration. Avoid exposure to tobacco smoke and fumes. Trial of  steroid nasal spray. cxr next week if needed

## 2011-09-20 ENCOUNTER — Emergency Department (INDEPENDENT_AMBULATORY_CARE_PROVIDER_SITE_OTHER): Payer: Medicare Other

## 2011-09-20 ENCOUNTER — Emergency Department (HOSPITAL_BASED_OUTPATIENT_CLINIC_OR_DEPARTMENT_OTHER)
Admission: EM | Admit: 2011-09-20 | Discharge: 2011-09-20 | Disposition: A | Discharge status: Home / Self Care | Payer: Medicare Other | Attending: Emergency Medicine | Admitting: Emergency Medicine

## 2011-09-20 ENCOUNTER — Encounter (HOSPITAL_BASED_OUTPATIENT_CLINIC_OR_DEPARTMENT_OTHER): Payer: Self-pay | Admitting: *Deleted

## 2011-09-20 DIAGNOSIS — J479 Bronchiectasis, uncomplicated: Secondary | ICD-10-CM

## 2011-09-20 DIAGNOSIS — R05 Cough: Secondary | ICD-10-CM

## 2011-09-20 DIAGNOSIS — R599 Enlarged lymph nodes, unspecified: Secondary | ICD-10-CM | POA: Insufficient documentation

## 2011-09-20 DIAGNOSIS — R590 Localized enlarged lymph nodes: Secondary | ICD-10-CM

## 2011-09-20 DIAGNOSIS — M47814 Spondylosis without myelopathy or radiculopathy, thoracic region: Secondary | ICD-10-CM | POA: Insufficient documentation

## 2011-09-20 DIAGNOSIS — R0989 Other specified symptoms and signs involving the circulatory and respiratory systems: Secondary | ICD-10-CM

## 2011-09-20 DIAGNOSIS — K209 Esophagitis, unspecified without bleeding: Secondary | ICD-10-CM | POA: Insufficient documentation

## 2011-09-20 DIAGNOSIS — R059 Cough, unspecified: Secondary | ICD-10-CM

## 2011-09-20 DIAGNOSIS — R918 Other nonspecific abnormal finding of lung field: Secondary | ICD-10-CM

## 2011-09-20 DIAGNOSIS — J189 Pneumonia, unspecified organism: Secondary | ICD-10-CM

## 2011-09-20 LAB — DIFFERENTIAL
Basophils Absolute: 0 10*3/uL (ref 0.0–0.1)
Eosinophils Relative: 3 % (ref 0–5)
Lymphs Abs: 3.1 10*3/uL (ref 0.7–4.0)
Monocytes Absolute: 0.5 10*3/uL (ref 0.1–1.0)
Monocytes Relative: 7 % (ref 3–12)

## 2011-09-20 LAB — CBC
HCT: 42.8 % (ref 39.0–52.0)
Hemoglobin: 14.9 g/dL (ref 13.0–17.0)
MCH: 33.3 pg (ref 26.0–34.0)
Platelets: 125 10*3/uL — ABNORMAL LOW (ref 150–400)
WBC: 6.8 10*3/uL (ref 4.0–10.5)

## 2011-09-20 LAB — BASIC METABOLIC PANEL
Calcium: 9 mg/dL (ref 8.4–10.5)
Chloride: 101 mEq/L (ref 96–112)
GFR calc Af Amer: >90 mL/min (ref >90)
GFR calc non Af Amer: 79 mL/min — ABNORMAL LOW (ref >90)

## 2011-09-20 MED ORDER — ALBUTEROL SULFATE HFA 108 (90 BASE) MCG/ACT IN AERS
2.0000 | INHALATION_SPRAY | RESPIRATORY_TRACT | Status: DC | PRN
  Administered 2011-09-20: 2 via RESPIRATORY_TRACT
  Filled 2011-09-20: qty 6.7

## 2011-09-20 MED ORDER — PREDNISONE 50 MG PO TABS
60.0000 mg | ORAL_TABLET | Freq: Once | ORAL | Status: AC
  Administered 2011-09-20: 60 mg via ORAL
  Filled 2011-09-20: qty 1

## 2011-09-20 MED ORDER — DOXYCYCLINE MONOHYDRATE 150 MG PO CAPS: 150.0000 mg | ORAL_CAPSULE | Freq: Two times a day (BID) | ORAL | Status: AC

## 2011-09-20 MED ORDER — ALBUTEROL SULFATE (5 MG/ML) 0.5% IN NEBU
5.0000 mg | INHALATION_SOLUTION | Freq: Once | RESPIRATORY_TRACT | Status: AC
  Administered 2011-09-20: 5 mg via RESPIRATORY_TRACT
  Filled 2011-09-20: qty 1

## 2011-09-20 MED ORDER — PREDNISONE 10 MG PO TABS: 20.0000 mg | ORAL_TABLET | Freq: Every day | ORAL | Status: DC

## 2011-09-20 MED ORDER — IOHEXOL 300 MG/ML  SOLN
80.0000 mL | Freq: Once | INTRAMUSCULAR | Status: AC | PRN
  Administered 2011-09-20: 80 mL via INTRAVENOUS

## 2011-09-20 MED ORDER — IPRATROPIUM BROMIDE 0.02 % IN SOLN
0.5000 mg | Freq: Once | RESPIRATORY_TRACT | Status: AC
  Administered 2011-09-20: 0.5 mg via RESPIRATORY_TRACT
  Filled 2011-09-20: qty 2.5

## 2011-09-20 NOTE — ED Notes (Signed)
Pt states he has had cough, congestion since Wed. Saw Dr. On Friday. Given Z-pak, Rob AC and Dymista without relief.

## 2011-09-20 NOTE — ED Provider Notes (Signed)
History    This chart was scribed for Kevin Quarry, MD, MD by Smitty Pluck. The patient was seen in room MH04 and the patient's care was started at 5:05PM.   CSN: [id number]  Arrival date & time 09/20/11  1629   First MD Initiated Contact with Patient 09/20/11 1652      Chief Complaint  Patient presents with  . Cough    (Consider location/radiation/quality/duration/timing/severity/associated sxs/prior treatment) The history is provided by the patient.   Kevin Glass is a 78 y.o. male who presents to the Emergency Department complaining of moderate cough onset 3 days ago. The symptoms have been constant since onset without radiation. Pt reports that he went to Dr. Laury Axon 1 day ago. Pt thinks that he has fever. Denies productive cough. He reports intermittent nasal drip. Pt has had past respiratory infections (pneumonia). Denies nausea, vomiting and diarrhea. Pt takes tylenol and saw palmetto every 2 days, 85 mg aspirin. He quit smoking 1989. Pt has hx total knee arthroplasty.   Past Medical History  Diagnosis Date  . Colonic polyp   . Arthritis   . Hemorrhoid   . BPH (benign prostatic hypertrophy)     Past Surgical History  Procedure Date  . Total knee arthroplasty 2003    Dr Despina Hick  . Hemorrhoid surgery     History reviewed. No pertinent family history.  History  Substance Use Topics  . Smoking status: Former Smoker    Quit date: 06/17/1983  . Smokeless tobacco: Not on file  . Alcohol Use: No      Review of Systems  All other systems reviewed and are negative.   10 Systems reviewed and all are negative for acute change except as noted in the HPI.   Allergies  Penicillins  Home Medications   Current Outpatient Rx  Name Route Sig Dispense Refill  . ACETAMINOPHEN 500 MG PO TABS Oral Take 500 mg by mouth every other day.     [name] VITAMIN C 500 MG PO TABS Oral Take 1,000 mg by mouth daily.     . ASPIRIN 81 MG PO TABS Oral Take 81 mg by mouth every other day.      . AZELASTINE-FLUTICASONE 137-50 MCG/ACT NA SUSP Nasal Place 1 spray into the nose 2 (two) times daily. 1 Bottle 0  . AZITHROMYCIN 250 MG PO TABS  As directed 6 tablet 0  . GUAIFENESIN-CODEINE 100-10 MG/5ML PO SYRP  1-2 tsp po qhs prn cough 120 mL 0  . GUAIFENESIN-CODEINE 100-10 MG/5ML PO SYRP  1-2 tsp po qhs prn cough 120 mL 0  . SAW PALMETTO 500 MG PO CAPS Oral Take 1 capsule by mouth every other day.       BP 150/71  Pulse 90  Temp(Src) 98.5 F (36.9 C) (Oral)  Resp 20  Ht 6' (1.829 m)  Wt 215 lb (97.523 kg)  BMI 29.16 kg/m2  SpO2 99%  Physical Exam  Nursing note and vitals reviewed. Constitutional: He is oriented to person, place, and time. He appears well-developed and well-nourished. No distress.  HENT:  Head: Normocephalic and atraumatic.  Mouth/Throat: Oropharynx is clear and moist.  Neck: Normal range of motion. Neck supple.  Cardiovascular: Normal rate, regular rhythm and normal heart sounds.   Pulmonary/Chest: Effort normal. No respiratory distress. He has wheezes (throughout ).  Abdominal: Soft. He exhibits no distension. There is no tenderness.  Neurological: He is alert and oriented to person, place, and time.  Skin: Skin is warm and dry.  Psychiatric: He has a normal mood and affect. His behavior is normal.    ED Course  Procedures (including critical care time) DIAGNOSTIC STUDIES: Oxygen Saturation is 99% on room air, normal by my interpretation.    COORDINATION OF CARE: EDP ordered medication: Albuterol 0.5%, atrovent 0.5 mg, deltazone 60 mg   5:56PM EDP Rechecks pt. EDP discusses lab results with pt.  Labs Reviewed - No data to display Dg Chest 2 View  09/20/2011  *RADIOLOGY REPORT*  Clinical Data: Cough and congestion  CHEST - 2 VIEW  Comparison: 11/29/2010  Findings: Prominent right hilum.  Right perihilar and right lower lobe airspace disease is present with mild progression from the prior study.  This may represent scarring however underlying mass  lesion cannot be excluded.  Further evaluation with CT scanning is suggested.  Mild scarring in the left lung base is similar.  No heart failure or effusion.  IMPRESSION: Prominent right hilum.  Increase in right perihilar and right lower lobe airspace disease.  These findings may all be due to scarring however underlying mass lesion cannot be excluded.  CT chest with contrast is suggested.  Original Report Authenticated By: Camelia Phenes, M.D.   Ct Chest W Contrast  09/20/2011  *RADIOLOGY REPORT*  Clinical Data: Chest congestion.  Cough.  Chest radiograph for revealed a prominent right hilum.  CT CHEST WITH CONTRAST  Technique:  Multidetector CT imaging of the chest was performed following the standard protocol during bolus administration of intravenous contrast.  Contrast: 80mL OMNIPAQUE IOHEXOL 300 MG/ML  SOLN  Comparison: 09/20/2011 chest radiograph  Findings: A right hilar lymph node measures 1.2 cm in short axis, abnormally enlarged.  However, much of the hilar prominence shown on chest radiography is due to prominent vasculature.  No cardiomegaly is observed.  There is faint calcification along the aortic valve, a calcification is also present in the left main, left anterior descending, and circumflex coronary arteries.  Right paratracheal lymph nodes measure up to 1.0 cm in short axis. There is mild wall thickening of the distal esophagus.  Scattered bands of interstitial accentuation are present in the lungs, especially peripherally.  There is some mild volume loss in the right lower lobe associated with cylindrical bronchiectasis.  Mild confluent scarring is noted in the right lower lobe.  There is mild nodularity along the minor fissure.  Thoracic spondylosis noted.  IMPRESSION:  1.  Peripheral interstitial accentuation in the lungs, query usual interstitial pneumonitis.  There is mild bronchiectasis in the right lower lobe along with scattered band-like scarring. 2.  Mild right hilar adenopathy with a  right hilar lymph node has a short axis diameter 1.2 cm.  This could be inflammatory/reactive or neoplastic. 2.  Borderline prominent right paratracheal lymph nodes. 3.  Wall thickening of the distal esophagus, suggesting esophagitis. 4.  Thoracic spondylosis. 5.  Coronary atherosclerosis.  Original Report Authenticated By: Dellia Cloud, M.D.     No diagnosis found. Results for orders placed during the hospital encounter of 09/20/11  BASIC METABOLIC PANEL      Component Value Range   Sodium 138  135 - 145 (mEq/L)   Potassium 3.9  3.5 - 5.1 (mEq/L)   Chloride 101  96 - 112 (mEq/L)   CO2 28  19 - 32 (mEq/L)   Glucose, Bld 99  70 - 99 (mg/dL)   BUN 12  6 - 23 (mg/dL)   Creatinine, Ser 1.61  0.50 - 1.35 (mg/dL)   Calcium 9.0  8.4 -  10.5 (mg/dL)   GFR calc non Af Amer 79 (*) >90 (mL/min)   GFR calc Af Amer >90  >90 (mL/min)  CBC      Component Value Range   WBC 6.8  4.0 - 10.5 (K/uL)   RBC 4.48  4.22 - 5.81 (MIL/uL)   Hemoglobin 14.9  13.0 - 17.0 (g/dL)   HCT 04.5  40.9 - 81.1 (%)   MCV 95.5  78.0 - 100.0 (fL)   MCH 33.3  26.0 - 34.0 (pg)   MCHC 34.8  30.0 - 36.0 (g/dL)   RDW 91.4  78.2 - 95.6 (%)   Platelets 125 (*) 150 - 400 (K/uL)  DIFFERENTIAL      Component Value Range   Neutrophils Relative 44  43 - 77 (%)   Neutro Abs 3.0  1.7 - 7.7 (K/uL)   Lymphocytes Relative 46  12 - 46 (%)   Lymphs Abs 3.1  0.7 - 4.0 (K/uL)   Monocytes Relative 7  3 - 12 (%)   Monocytes Absolute 0.5  0.1 - 1.0 (K/uL)   Eosinophils Relative 3  0 - 5 (%)   Eosinophils Absolute 0.2  0.0 - 0.7 (K/uL)   Basophils Relative 0  0 - 1 (%)   Basophils Absolute 0.0  0.0 - 0.1 (K/uL)   Dg Chest 2 View  09/20/2011  *RADIOLOGY REPORT*  Clinical Data: Cough and congestion  CHEST - 2 VIEW  Comparison: 11/29/2010  Findings: Prominent right hilum.  Right perihilar and right lower lobe airspace disease is present with mild progression from the prior study.  This may represent scarring however underlying mass  lesion cannot be excluded.  Further evaluation with CT scanning is suggested.  Mild scarring in the left lung base is similar.  No heart failure or effusion.  IMPRESSION: Prominent right hilum.  Increase in right perihilar and right lower lobe airspace disease.  These findings may all be due to scarring however underlying mass lesion cannot be excluded.  CT chest with contrast is suggested.  Original Report Authenticated By: Camelia Phenes, M.D.   Ct Chest W Contrast  09/20/2011  *RADIOLOGY REPORT*  Clinical Data: Chest congestion.  Cough.  Chest radiograph for revealed a prominent right hilum.  CT CHEST WITH CONTRAST  Technique:  Multidetector CT imaging of the chest was performed following the standard protocol during bolus administration of intravenous contrast.  Contrast: 80mL OMNIPAQUE IOHEXOL 300 MG/ML  SOLN  Comparison: 09/20/2011 chest radiograph  Findings: A right hilar lymph node measures 1.2 cm in short axis, abnormally enlarged.  However, much of the hilar prominence shown on chest radiography is due to prominent vasculature.  No cardiomegaly is observed.  There is faint calcification along the aortic valve, a calcification is also present in the left main, left anterior descending, and circumflex coronary arteries.  Right paratracheal lymph nodes measure up to 1.0 cm in short axis. There is mild wall thickening of the distal esophagus.  Scattered bands of interstitial accentuation are present in the lungs, especially peripherally.  There is some mild volume loss in the right lower lobe associated with cylindrical bronchiectasis.  Mild confluent scarring is noted in the right lower lobe.  There is mild nodularity along the minor fissure.  Thoracic spondylosis noted.  IMPRESSION:  1.  Peripheral interstitial accentuation in the lungs, query usual interstitial pneumonitis.  There is mild bronchiectasis in the right lower lobe along with scattered band-like scarring. 2.  Mild right hilar adenopathy with a  right  hilar lymph node has a short axis diameter 1.2 cm.  This could be inflammatory/reactive or neoplastic. 2.  Borderline prominent right paratracheal lymph nodes. 3.  Wall thickening of the distal esophagus, suggesting esophagitis. 4.  Thoracic spondylosis. 5.  Coronary atherosclerosis.  Original Report Authenticated By: Dellia Cloud, M.D.    Patient with question of mass on plain x-Berneda Piccininni. CT ordered and shows interstitial pneumonitis as noted in the impression above. Also in distal esophagitis and a right hilar adenopathy with a 1.2 cm lymph node. Patient is advised of the above and to follow up with his primary care Dr. He is placed on doxycycline, prednisone, and albuterol. He is advised of all the above is voices understanding of the plan.   MDM  History/physical exam/procedure(s) were performed by non-physician practitioner and as supervising physician I was immediately available for consultation/collaboration. I have reviewed all notes and am in agreement with care and plan.   Kevin Quarry, MD 09/20/11 2005

## 2011-09-20 NOTE — Discharge Instructions (Signed)
You have some inflammation of the lungs which is likely causing the wheezing and coughing. This is likely from infection and is being treated with antibiotics. You're also been placed on prednisone and given an inhaler. You have a large lymph node on your CT scan which does need to have followup. Also, the distal esophagus is inflamed consistent with esophagitis. Please recheck with your doctor on Monday or Tuesday and return if he were worse at any time.

## 2011-09-22 ENCOUNTER — Telehealth: Payer: Self-pay | Admitting: *Deleted

## 2011-09-22 NOTE — Telephone Encounter (Signed)
Called pt to advise a follow up apt is needed, pt

## 2011-09-22 NOTE — Telephone Encounter (Signed)
Apt rescheduled for 09/25/11 at 11:15 with Dr.Lowne

## 2011-09-22 NOTE — Telephone Encounter (Signed)
Agree, definitely needs a f/u , please arrange with Dr Laury Axon, I think that would be the pt's preference (see phone note 05-2011)

## 2011-09-22 NOTE — Telephone Encounter (Signed)
Pt accepted apt at 4:15pm, placed pt on schedule, however noted MD Lowne will not be in the office per MD [facility] assistant, noted assistant will contact pt to set up another date and time.

## 2011-09-22 NOTE — Telephone Encounter (Signed)
Pt called in and wanted to inform MD Lowne that in addition to his office visit this past Friday 09-19-11 he went to the ER and had an x-ray and CT-Scan performed. If any further instructions needed please contact pt [phone], (pt noted as [facility] pt, note also sent to [place] for [place])

## 2011-09-22 NOTE — Telephone Encounter (Signed)
Pt will need f/u ov either with me or Dr Drue Novel--- to discuss what should be done about abnormality on CT

## 2011-09-25 ENCOUNTER — Encounter: Payer: Self-pay | Admitting: Family Medicine

## 2011-09-25 ENCOUNTER — Ambulatory Visit (INDEPENDENT_AMBULATORY_CARE_PROVIDER_SITE_OTHER): Payer: Medicare Other | Admitting: Family Medicine

## 2011-09-25 VITALS — BP 130/72 | HR 88 | Temp 98.1°F | Wt 217.2 lb

## 2011-09-25 DIAGNOSIS — R9389 Abnormal findings on diagnostic imaging of other specified body structures: Secondary | ICD-10-CM

## 2011-09-25 DIAGNOSIS — J40 Bronchitis, not specified as acute or chronic: Secondary | ICD-10-CM

## 2011-09-25 DIAGNOSIS — J309 Allergic rhinitis, unspecified: Secondary | ICD-10-CM

## 2011-09-25 DIAGNOSIS — R05 Cough: Secondary | ICD-10-CM

## 2011-09-25 DIAGNOSIS — J302 Other seasonal allergic rhinitis: Secondary | ICD-10-CM

## 2011-09-25 MED ORDER — OMEPRAZOLE MAGNESIUM 20 MG PO TBEC: 20.0000 mg | DELAYED_RELEASE_TABLET | Freq: Every day | ORAL | Status: DC

## 2011-09-25 MED ORDER — CETIRIZINE HCL 10 MG PO TABS: 10.0000 mg | ORAL_TABLET | Freq: Every day | ORAL | Status: DC

## 2011-09-25 MED ORDER — BECLOMETHASONE DIPROPIONATE 40 MCG/ACT IN AERS: 2.0000 | INHALATION_SPRAY | Freq: Two times a day (BID) | RESPIRATORY_TRACT | Status: DC

## 2011-09-25 MED ORDER — BECLOMETHASONE DIPROPIONATE 80 MCG/ACT NA AERS: 2.0000 | INHALATION_SPRAY | Freq: Every day | NASAL | Status: DC

## 2011-09-26 ENCOUNTER — Ambulatory Visit: Payer: Medicare Other | Admitting: Family Medicine

## 2011-09-26 ENCOUNTER — Encounter: Payer: Self-pay | Admitting: Family Medicine

## 2011-09-26 ENCOUNTER — Telehealth: Payer: Self-pay

## 2011-09-26 NOTE — Progress Notes (Signed)
  Subjective:    Patient ID: Kevin Glass, male    DOB: [birth year], 78 y.o.   MRN: [id number]  HPI Pt here f/u ER.  CT was done and abnormality seen---? Infectious.  Pt abx was changed to doxy. He is on pred and con't with inhalers.  He is feeling much better.   See ER visit    Review of Systems As above    Objective:   Physical Exam  Constitutional: He is oriented to person, place, and time. He appears well-developed and well-nourished.  Neck: Normal range of motion. Neck supple.  Cardiovascular: Normal rate and normal heart sounds.   No murmur heard. Pulmonary/Chest: Effort normal and breath sounds normal. No respiratory distress. He has no wheezes. He has no rales.  Neurological: He is alert and oriented to person, place, and time.  Psychiatric: He has a normal mood and affect. His behavior is normal. Judgment and thought content normal.          Assessment & Plan:  S/p bronchitis----abn on CT--- will check xray after abx and steroid are finished                              Discussed ct with radiology----repeat xray is all that is needed

## 2011-09-26 NOTE — Telephone Encounter (Signed)
Message copied by Arnette Norris on Fri Sep 26, 2011  4:05 PM ------      Message from: Lelon Perla      Created: Fri Sep 26, 2011 11:30 AM       Let pt know we only need to repeat cxr after abx and pred is finished---about 2 weeks

## 2011-09-26 NOTE — Telephone Encounter (Signed)
Patient made aware and he voiced understanding. He will call once medications are complete.     KP

## 2011-10-01 ENCOUNTER — Ambulatory Visit (INDEPENDENT_AMBULATORY_CARE_PROVIDER_SITE_OTHER)
Admission: RE | Admit: 2011-10-01 | Discharge: 2011-10-01 | Disposition: A | Discharge status: Home / Self Care | Payer: Medicare Other | Source: Ambulatory Visit | Attending: Family Medicine | Admitting: Family Medicine

## 2011-10-01 DIAGNOSIS — R9389 Abnormal findings on diagnostic imaging of other specified body structures: Secondary | ICD-10-CM

## 2011-10-01 NOTE — Telephone Encounter (Signed)
Patient aware xray order already placed. Patient to walk in at [facility] for Xray

## 2011-10-01 NOTE — Telephone Encounter (Signed)
Patient is calling (per Kevin Glass) to let us know that he has finished his medication and thinks he is supposed to go have another xray. Please advise.

## 2011-11-19 ENCOUNTER — Encounter: Payer: Self-pay | Admitting: Family Medicine

## 2011-11-19 ENCOUNTER — Ambulatory Visit (INDEPENDENT_AMBULATORY_CARE_PROVIDER_SITE_OTHER): Payer: Medicare Other | Admitting: Family Medicine

## 2011-11-19 VITALS — BP 118/76 | HR 93 | Temp 98.5°F | Wt 218.0 lb

## 2011-11-19 DIAGNOSIS — R29890 Loss of height: Secondary | ICD-10-CM

## 2011-11-19 DIAGNOSIS — M549 Dorsalgia, unspecified: Secondary | ICD-10-CM

## 2011-11-19 NOTE — Patient Instructions (Signed)
Back Pain, Adult Low back pain is very common. About 1 in 5 people have back pain.The cause of low back pain is rarely dangerous. The pain often gets better over time.About half of people with a sudden onset of back pain feel better in just 2 weeks. About 8 in 10 people feel better by 6 weeks.  CAUSES Some common causes of back pain include:  Strain of the muscles or ligaments supporting the spine.   Wear and tear (degeneration) of the spinal discs.   Arthritis.   Direct injury to the back.  DIAGNOSIS Most of the time, the direct cause of low back pain is not known.However, back pain can be treated effectively even when the exact cause of the pain is unknown.Answering your caregiver's questions about your overall health and symptoms is one of the most accurate ways to make sure the cause of your pain is not dangerous. If your caregiver needs more information, he or she may order lab work or imaging tests (X-rays or MRIs).However, even if imaging tests show changes in your back, this usually does not require surgery. HOME CARE INSTRUCTIONS For many people, back pain returns.Since low back pain is rarely dangerous, it is often a condition that people can learn to manageon their own.   Remain active. It is stressful on the back to sit or stand in one place. Do not sit, drive, or stand in one place for more than 30 minutes at a time. Take short walks on level surfaces as soon as pain allows.Try to increase the length of time you walk each day.   Do not stay in bed.Resting more than 1 or 2 days can delay your recovery.   Do not avoid exercise or work.Your body is made to move.It is not dangerous to be active, even though your back may hurt.Your back will likely heal faster if you return to being active before your pain is gone.   Pay attention to your body when you bend and lift. Many people have less discomfortwhen lifting if they bend their knees, keep the load close to their  bodies,and avoid twisting. Often, the most comfortable positions are those that put less stress on your recovering back.   Find a comfortable position to sleep. Use a firm mattress and lie on your side with your knees slightly bent. If you lie on your back, put a pillow under your knees.   Only take over-the-counter or prescription medicines as directed by your caregiver. Over-the-counter medicines to reduce pain and inflammation are often the most helpful.Your caregiver may prescribe muscle relaxant drugs.These medicines help dull your pain so you can more quickly return to your normal activities and healthy exercise.   Put ice on the injured area.   Put ice in a plastic bag.   Place a towel between your skin and the bag.   Leave the ice on for 15 to 20 minutes, 3 to 4 times a day for the first 2 to 3 days. After that, ice and heat may be alternated to reduce pain and spasms.   Ask your caregiver about trying back exercises and gentle massage. This may be of some benefit.   Avoid feeling anxious or stressed.Stress increases muscle tension and can worsen back pain.It is important to recognize when you are anxious or stressed and learn ways to manage it.Exercise is a great option.  SEEK MEDICAL CARE IF:  You have pain that is not relieved with rest or medicine.   You have   pain that does not improve in 1 week.   You have new symptoms.   You are generally not feeling well.  SEEK IMMEDIATE MEDICAL CARE IF:   You have pain that radiates from your back into your legs.   You develop new bowel or bladder control problems.   You have unusual weakness or numbness in your arms or legs.   You develop nausea or vomiting.   You develop abdominal pain.   You feel faint.  Document Released: 06/02/2005 Document Revised: 05/22/2011 Document Reviewed: 10/21/2010 [employer] Patient Information 2012 [employer]. 

## 2011-11-19 NOTE — Progress Notes (Signed)
  Subjective:    Kevin Glass is a 78 y.o. male who presents for evaluation of low back pain. The patient has had recurrent self limited episodes of low back pain in the past. Symptoms have been present for several years off and on and are unchanged.  Onset was related to / precipitated by no known injury. The pain is located in the interscapular area or across the lower back and does not radiate. The pain is described as aching and occurs all day. He rates his pain as moderate. Symptoms are exacerbated by exercise and standing. Symptoms are improved by nothing. He has also tried ice and rest which provided no symptom relief. He has no other symptoms associated with the back pain. The patient has no "red flag" history indicative of complicated back pain.  The following portions of the patient's history were reviewed and updated as appropriate: allergies, current medications, past family history, past medical history, past social history, past surgical history and problem list.  Review of Systems Pertinent items are noted in HPI.    Objective:   Normal reflexes, gait, strength and negative straight-leg raise. Inspection and palpation: kyphosis noted t spine.    Assessment:    Nonspecific acute low back pain    Plan:    Natural history and expected course discussed. Questions answered. Agricultural engineer distributed. Regular aerobic and trunk strengthening exercises discussed. Short (2-4 day) period of relative rest recommended until acute symptoms improve. Ice to affected area as needed for local pain relief. Heat to affected area as needed for local pain relief. xrays and bmd

## 2011-11-26 ENCOUNTER — Ambulatory Visit (INDEPENDENT_AMBULATORY_CARE_PROVIDER_SITE_OTHER)
Admission: RE | Admit: 2011-11-26 | Discharge: 2011-11-26 | Disposition: A | Discharge status: Home / Self Care | Payer: Medicare Other | Source: Ambulatory Visit | Attending: Family Medicine | Admitting: Family Medicine

## 2011-11-26 DIAGNOSIS — R29890 Loss of height: Secondary | ICD-10-CM

## 2011-11-26 DIAGNOSIS — M549 Dorsalgia, unspecified: Secondary | ICD-10-CM

## 2011-12-01 ENCOUNTER — Ambulatory Visit (INDEPENDENT_AMBULATORY_CARE_PROVIDER_SITE_OTHER)
Admission: RE | Admit: 2011-12-01 | Discharge: 2011-12-01 | Disposition: A | Discharge status: Home / Self Care | Payer: Medicare Other | Source: Ambulatory Visit | Attending: Family Medicine | Admitting: Family Medicine

## 2011-12-01 DIAGNOSIS — R29890 Loss of height: Secondary | ICD-10-CM

## 2011-12-17 ENCOUNTER — Encounter: Payer: Self-pay | Admitting: Family Medicine

## 2011-12-17 ENCOUNTER — Ambulatory Visit (INDEPENDENT_AMBULATORY_CARE_PROVIDER_SITE_OTHER): Payer: Medicare Other | Admitting: Family Medicine

## 2011-12-17 VITALS — BP 120/68 | HR 77 | Temp 98.4°F | Wt 222.0 lb

## 2011-12-17 DIAGNOSIS — M549 Dorsalgia, unspecified: Secondary | ICD-10-CM

## 2011-12-17 MED ORDER — NONFORMULARY OR COMPOUNDED ITEM: Status: DC

## 2011-12-17 NOTE — Progress Notes (Signed)
  Subjective:    Kevin Glass is a 78 y.o. male who presents for follow up of low back problems. Current symptoms include: pain in low back  (aching in character; 5/10 in severity) and weakness in back. Symptoms have not changed from the previous visit. Exacerbating factors identified by the patient are getting up in am.  The following portions of the patient's history were reviewed and updated as appropriate: allergies, current medications, past family history, past medical history, past social history, past surgical history and problem list.    Objective:    BP 120/68  Pulse 77  Temp 98.4 F (36.9 C) (Oral)  Wt 222 lb (100.699 kg)  SpO2 95%  BP 120/68  Pulse 77  Temp 98.4 F (36.9 C) (Oral)  Wt 222 lb (100.699 kg)  SpO2 95% General appearance: alert, cooperative, appears stated age and no distress Extremities: extremities normal, atraumatic, no cyanosis or edema Neurologic: Alert and oriented X 3, normal strength and tone. Normal symmetric reflexes. Normal coordination and gait                                                             Assessment:    Nonspecific acute low back pain    Plan:    Natural history and expected course discussed. Questions answered. Agricultural engineer distributed. Proper lifting, bending technique discussed. Regular aerobic and trunk strengthening exercises discussed. Short (2-4 day) period of relative rest recommended until acute symptoms improve. Ice to affected area as needed for local pain relief. Heat to affected area as needed for local pain relief. OTC analgesics as needed. PT referral. f/u prn

## 2011-12-17 NOTE — Patient Instructions (Addendum)
Back Pain, Adult Low back pain is very common. About 1 in 5 people have back pain.The cause of low back pain is rarely dangerous. The pain often gets better over time.About half of people with a sudden onset of back pain feel better in just 2 weeks. About 8 in 10 people feel better by 6 weeks.  CAUSES Some common causes of back pain include:  Strain of the muscles or ligaments supporting the spine.   Wear and tear (degeneration) of the spinal discs.   Arthritis.   Direct injury to the back.  DIAGNOSIS Most of the time, the direct cause of low back pain is not known.However, back pain can be treated effectively even when the exact cause of the pain is unknown.Answering your caregiver's questions about your overall health and symptoms is one of the most accurate ways to make sure the cause of your pain is not dangerous. If your caregiver needs more information, he or she may order lab work or imaging tests (X-rays or MRIs).However, even if imaging tests show changes in your back, this usually does not require surgery. HOME CARE INSTRUCTIONS For many people, back pain returns.Since low back pain is rarely dangerous, it is often a condition that people can learn to manageon their own.   Remain active. It is stressful on the back to sit or stand in one place. Do not sit, drive, or stand in one place for more than 30 minutes at a time. Take short walks on level surfaces as soon as pain allows.Try to increase the length of time you walk each day.   Do not stay in bed.Resting more than 1 or 2 days can delay your recovery.   Do not avoid exercise or work.Your body is made to move.It is not dangerous to be active, even though your back may hurt.Your back will likely heal faster if you return to being active before your pain is gone.   Pay attention to your body when you bend and lift. Many people have less discomfortwhen lifting if they bend their knees, keep the load close to their  bodies,and avoid twisting. Often, the most comfortable positions are those that put less stress on your recovering back.   Find a comfortable position to sleep. Use a firm mattress and lie on your side with your knees slightly bent. If you lie on your back, put a pillow under your knees.   Only take over-the-counter or prescription medicines as directed by your caregiver. Over-the-counter medicines to reduce pain and inflammation are often the most helpful.Your caregiver may prescribe muscle relaxant drugs.These medicines help dull your pain so you can more quickly return to your normal activities and healthy exercise.   Put ice on the injured area.   Put ice in a plastic bag.   Place a towel between your skin and the bag.   Leave the ice on for 15 to 20 minutes, 3 to 4 times a day for the first 2 to 3 days. After that, ice and heat may be alternated to reduce pain and spasms.   Ask your caregiver about trying back exercises and gentle massage. This may be of some benefit.   Avoid feeling anxious or stressed.Stress increases muscle tension and can worsen back pain.It is important to recognize when you are anxious or stressed and learn ways to manage it.Exercise is a great option.  SEEK MEDICAL CARE IF:  You have pain that is not relieved with rest or medicine.   You have   pain that does not improve in 1 week.   You have new symptoms.   You are generally not feeling well.  SEEK IMMEDIATE MEDICAL CARE IF:   You have pain that radiates from your back into your legs.   You develop new bowel or bladder control problems.   You have unusual weakness or numbness in your arms or legs.   You develop nausea or vomiting.   You develop abdominal pain.   You feel faint.  Document Released: 06/02/2005 Document Revised: 05/22/2011 Document Reviewed: 10/21/2010 [employer] Patient Information 2012 [employer]. 

## 2011-12-31 ENCOUNTER — Ambulatory Visit: Payer: Medicare Other | Attending: Family Medicine | Admitting: Physical Therapy

## 2011-12-31 DIAGNOSIS — M545 Low back pain, unspecified: Secondary | ICD-10-CM | POA: Insufficient documentation

## 2011-12-31 DIAGNOSIS — M2569 Stiffness of other specified joint, not elsewhere classified: Secondary | ICD-10-CM | POA: Insufficient documentation

## 2011-12-31 DIAGNOSIS — IMO0001 Reserved for inherently not codable concepts without codable children: Secondary | ICD-10-CM | POA: Insufficient documentation

## 2012-01-08 ENCOUNTER — Ambulatory Visit: Payer: Medicare Other | Admitting: Physical Therapy

## 2012-01-15 ENCOUNTER — Ambulatory Visit: Payer: Medicare Other | Attending: Family Medicine | Admitting: Rehabilitation

## 2012-01-15 DIAGNOSIS — IMO0001 Reserved for inherently not codable concepts without codable children: Secondary | ICD-10-CM | POA: Insufficient documentation

## 2012-01-15 DIAGNOSIS — M545 Low back pain, unspecified: Secondary | ICD-10-CM | POA: Insufficient documentation

## 2012-01-15 DIAGNOSIS — M2569 Stiffness of other specified joint, not elsewhere classified: Secondary | ICD-10-CM | POA: Insufficient documentation

## 2012-01-22 ENCOUNTER — Encounter: Payer: Medicare Other | Admitting: Physical Therapy

## 2012-01-29 ENCOUNTER — Encounter: Payer: Medicare Other | Admitting: Physical Therapy

## 2012-06-25 ENCOUNTER — Ambulatory Visit (INDEPENDENT_AMBULATORY_CARE_PROVIDER_SITE_OTHER): Payer: Medicare Other | Admitting: Family Medicine

## 2012-06-25 ENCOUNTER — Encounter: Payer: Self-pay | Admitting: Family Medicine

## 2012-06-25 VITALS — BP 116/70 | HR 80 | Temp 98.0°F | Ht 69.0 in | Wt 217.0 lb

## 2012-06-25 DIAGNOSIS — Z0000 Encounter for general adult medical examination without abnormal findings: Secondary | ICD-10-CM

## 2012-06-25 DIAGNOSIS — E739 Lactose intolerance, unspecified: Secondary | ICD-10-CM

## 2012-06-25 DIAGNOSIS — Z136 Encounter for screening for cardiovascular disorders: Secondary | ICD-10-CM

## 2012-06-25 DIAGNOSIS — N400 Enlarged prostate without lower urinary tract symptoms: Secondary | ICD-10-CM

## 2012-06-25 LAB — HEPATIC FUNCTION PANEL
ALT: 15 U/L (ref 0–53)
Albumin: 4.3 g/dL (ref 3.5–5.2)
Total Protein: 7.1 g/dL (ref 6.0–8.3)

## 2012-06-25 LAB — BASIC METABOLIC PANEL
BUN: 11 mg/dL (ref 6–23)
CO2: 29 mEq/L (ref 19–32)
Chloride: 104 mEq/L (ref 96–112)
Creatinine, Ser: 0.8 mg/dL (ref 0.4–1.5)
Potassium: 4.1 mEq/L (ref 3.5–5.1)

## 2012-06-25 LAB — CBC WITH DIFFERENTIAL/PLATELET
Basophils Relative: 0.3 % (ref 0.0–3.0)
Eosinophils Absolute: 0.2 10*3/uL (ref 0.0–0.7)
Eosinophils Relative: 2.4 % (ref 0.0–5.0)
HCT: 47.1 % (ref 39.0–52.0)
Lymphs Abs: 3.5 10*3/uL (ref 0.7–4.0)
MCHC: 33.9 g/dL (ref 30.0–36.0)
MCV: 96.7 fl (ref 78.0–100.0)
Monocytes Absolute: 0.5 10*3/uL (ref 0.1–1.0)
RBC: 4.88 Mil/uL (ref 4.22–5.81)
WBC: 7.7 10*3/uL (ref 4.5–10.5)

## 2012-06-25 LAB — LIPID PANEL
Cholesterol: 181 mg/dL (ref 0–200)
LDL Cholesterol: 119 mg/dL — ABNORMAL HIGH (ref 0–99)
Total CHOL/HDL Ratio: 5
VLDL: 24.6 mg/dL (ref 0.0–40.0)

## 2012-06-25 LAB — PSA: PSA: 0.86 ng/mL (ref 0.10–4.00)

## 2012-06-25 MED ORDER — NONFORMULARY OR COMPOUNDED ITEM: Status: DC

## 2012-06-25 NOTE — Patient Instructions (Addendum)
Preventive Care for Adults, Male A healthy lifestyle and preventive care can promote health and wellness. Preventive health guidelines for men include the following key practices:  A routine yearly physical is a good way to check with your caregiver about your health and preventative screening. It is a chance to share any concerns and updates on your health, and to receive a thorough exam.  Visit your dentist for a routine exam and preventative care every 6 months. Brush your teeth twice a day and floss once a day. Good oral hygiene prevents tooth decay and gum disease.  The frequency of eye exams is based on your age, health, family medical history, use of contact lenses, and other factors. Follow your caregiver's recommendations for frequency of eye exams.  Eat a healthy diet. Foods like vegetables, fruits, whole grains, low-fat dairy products, and lean protein foods contain the nutrients you need without too many calories. Decrease your intake of foods high in solid fats, added sugars, and salt. Eat the right amount of calories for you.Get information about a proper diet from your caregiver, if necessary.  Regular physical exercise is one of the most important things you can do for your health. Most adults should get at least 150 minutes of moderate-intensity exercise (any activity that increases your heart rate and causes you to sweat) each week. In addition, most adults need muscle-strengthening exercises on 2 or more days a week.  Maintain a healthy weight. The body mass index (BMI) is a screening tool to identify possible weight problems. It provides an estimate of body fat based on height and weight. Your caregiver can help determine your BMI, and can help you achieve or maintain a healthy weight.For adults 20 years and older:  A BMI below 18.5 is considered underweight.  A BMI of 18.5 to 24.9 is normal.  A BMI of 25 to 29.9 is considered overweight.  A BMI of 30 and above is  considered obese.  Maintain normal blood lipids and cholesterol levels by exercising and minimizing your intake of saturated fat. Eat a balanced diet with plenty of fruit and vegetables. Blood tests for lipids and cholesterol should begin at age 20 and be repeated every 5 years. If your lipid or cholesterol levels are high, you are over 50, or you are a high risk for heart disease, you may need your cholesterol levels checked more frequently.Ongoing high lipid and cholesterol levels should be treated with medicines if diet and exercise are not effective.  If you smoke, find out from your caregiver how to quit. If you do not use tobacco, do not start.  If you choose to drink alcohol, do not exceed 2 drinks per day. One drink is considered to be 12 ounces (355 mL) of beer, 5 ounces (148 mL) of wine, or 1.5 ounces (44 mL) of liquor.  Avoid use of street drugs. Do not share needles with anyone. Ask for help if you need support or instructions about stopping the use of drugs.  High blood pressure causes heart disease and increases the risk of stroke. Your blood pressure should be checked at least every 1 to 2 years. Ongoing high blood pressure should be treated with medicines, if weight loss and exercise are not effective.  If you are 45 to 77 years old, ask your caregiver if you should take aspirin to prevent heart disease.  Diabetes screening involves taking a blood sample to check your fasting blood sugar level. This should be done once every 3 years,   after age 45, if you are within normal weight and without risk factors for diabetes. Testing should be considered at a younger age or be carried out more frequently if you are overweight and have at least 1 risk factor for diabetes.  Colorectal cancer can be detected and often prevented. Most routine colorectal cancer screening begins at the age of 50 and continues through age 75. However, your caregiver may recommend screening at an earlier age if you  have risk factors for colon cancer. On a yearly basis, your caregiver may provide home test kits to check for hidden blood in the stool. Use of a small camera at the end of a tube, to directly examine the colon (sigmoidoscopy or colonoscopy), can detect the earliest forms of colorectal cancer. Talk to your caregiver about this at age 50, when routine screening begins. Direct examination of the colon should be repeated every 5 to 10 years through age 75, unless early forms of pre-cancerous polyps or small growths are found.  Hepatitis C blood testing is recommended for all people born from 1945 through 1965 and any individual with known risks for hepatitis C.  Practice safe sex. Use condoms and avoid high-risk sexual practices to reduce the spread of sexually transmitted infections (STIs). STIs include gonorrhea, chlamydia, syphilis, trichomonas, herpes, HPV, and human immunodeficiency virus (HIV). Herpes, HIV, and HPV are viral illnesses that have no cure. They can result in disability, cancer, and death.  A one-time screening for abdominal aortic aneurysm (AAA) and surgical repair of large AAAs by sound wave imaging (ultrasonography) is recommended for ages 65 to 75 years who are current or former smokers.  Healthy men should no longer receive prostate-specific antigen (PSA) blood tests as part of routine cancer screening. Consult with your caregiver about prostate cancer screening.  Testicular cancer screening is not recommended for adult males who have no symptoms. Screening includes self-exam, caregiver exam, and other screening tests. Consult with your caregiver about any symptoms you have or any concerns you have about testicular cancer.  Use sunscreen with skin protection factor (SPF) of 30 or more. Apply sunscreen liberally and repeatedly throughout the day. You should seek shade when your shadow is shorter than you. Protect yourself by wearing long sleeves, pants, a wide-brimmed hat, and  sunglasses year round, whenever you are outdoors.  Once a month, do a whole body skin exam, using a mirror to look at the skin on your back. Notify your caregiver of new moles, moles that have irregular borders, moles that are larger than a pencil eraser, or moles that have changed in shape or color.  Stay current with required immunizations.  Influenza. You need a dose every fall (or winter). The composition of the flu vaccine changes each year, so being vaccinated once is not enough.  Pneumococcal polysaccharide. You need 1 to 2 doses if you smoke cigarettes or if you have certain chronic medical conditions. You need 1 dose at age 65 (or older) if you have never been vaccinated.  Tetanus, diphtheria, pertussis (Tdap, Td). Get 1 dose of Tdap vaccine if you are younger than age 65 years, are over 65 and have contact with an infant, are a healthcare worker, or simply want to be protected from whooping cough. After that, you need a Td booster dose every 10 years. Consult your caregiver if you have not had at least 3 tetanus and diphtheria-containing shots sometime in your life or have a deep or dirty wound.  HPV. This vaccine is recommended   for males 13 through 77 years of age. This vaccine may be given to men 22 through 77 years of age who have not completed the 3 dose series. It is recommended for men through age 26 who have sex with men or whose immune system is weakened because of HIV infection, other illness, or medications. The vaccine is given in 3 doses over 6 months.  Measles, mumps, rubella (MMR). You need at least 1 dose of MMR if you were born in 1957 or later. You may also need a 2nd dose.  Meningococcal. If you are age 19 to 21 years and a first-year college student living in a residence hall, or have one of several medical conditions, you need to get vaccinated against meningococcal disease. You may also need additional booster doses.  Zoster (shingles). If you are age 60 years or  older, you should get this vaccine.  Varicella (chickenpox). If you have never had chickenpox or you were vaccinated but received only 1 dose, talk to your caregiver to find out if you need this vaccine.  Hepatitis A. You need this vaccine if you have a specific risk factor for hepatitis A virus infection, or you simply wish to be protected from this disease. The vaccine is usually given as 2 doses, 6 to 18 months apart.  Hepatitis B. You need this vaccine if you have a specific risk factor for hepatitis B virus infection or you simply wish to be protected from this disease. The vaccine is given in 3 doses, usually over 6 months. Preventative Service / Frequency Ages 19 to 39  Blood pressure check.** / Every 1 to 2 years.  Lipid and cholesterol check.** / Every 5 years beginning at age 20.  Hepatitis C blood test.** / For any individual with known risks for hepatitis C.  Skin self-exam. / Monthly.  Influenza immunization.** / Every year.  Pneumococcal polysaccharide immunization.** / 1 to 2 doses if you smoke cigarettes or if you have certain chronic medical conditions.  Tetanus, diphtheria, pertussis (Tdap,Td) immunization. / A one-time dose of Tdap vaccine. After that, you need a Td booster dose every 10 years.  HPV immunization. / 3 doses over 6 months, if 26 and younger.  Measles, mumps, rubella (MMR) immunization. / You need at least 1 dose of MMR if you were born in 1957 or later. You may also need a 2nd dose.  Meningococcal immunization. / 1 dose if you are age 19 to 21 years and a first-year college student living in a residence hall, or have one of several medical conditions, you need to get vaccinated against meningococcal disease. You may also need additional booster doses.  Varicella immunization.** / Consult your caregiver.  Hepatitis A immunization.** / Consult your caregiver. 2 doses, 6 to 18 months apart.  Hepatitis B immunization.** / Consult your caregiver. 3 doses  usually over 6 months. Ages 40 to 64  Blood pressure check.** / Every 1 to 2 years.  Lipid and cholesterol check.** / Every 5 years beginning at age 20.  Fecal occult blood test (FOBT) of stool. / Every year beginning at age 50 and continuing until age 75. You may not have to do this test if you get colonoscopy every 10 years.  Flexible sigmoidoscopy** or colonoscopy.** / Every 5 years for a flexible sigmoidoscopy or every 10 years for a colonoscopy beginning at age 50 and continuing until age 75.  Hepatitis C blood test.** / For all people born from 1945 through 1965 and any   individual with known risks for hepatitis C.  Skin self-exam. / Monthly.  Influenza immunization.** / Every year.  Pneumococcal polysaccharide immunization.** / 1 to 2 doses if you smoke cigarettes or if you have certain chronic medical conditions.  Tetanus, diphtheria, pertussis (Tdap/Td) immunization.** / A one-time dose of Tdap vaccine. After that, you need a Td booster dose every 10 years.  Measles, mumps, rubella (MMR) immunization. / You need at least 1 dose of MMR if you were born in 1957 or later. You may also need a 2nd dose.  Varicella immunization.**/ Consult your caregiver.  Meningococcal immunization.** / Consult your caregiver.  Hepatitis A immunization.** / Consult your caregiver. 2 doses, 6 to 18 months apart.  Hepatitis B immunization.** / Consult your caregiver. 3 doses, usually over 6 months. Ages 65 and over  Blood pressure check.** / Every 1 to 2 years.  Lipid and cholesterol check.**/ Every 5 years beginning at age 20.  Fecal occult blood test (FOBT) of stool. / Every year beginning at age 50 and continuing until age 75. You may not have to do this test if you get colonoscopy every 10 years.  Flexible sigmoidoscopy** or colonoscopy.** / Every 5 years for a flexible sigmoidoscopy or every 10 years for a colonoscopy beginning at age 50 and continuing until age 75.  Hepatitis C blood  test.** / For all people born from 1945 through 1965 and any individual with known risks for hepatitis C.  Abdominal aortic aneurysm (AAA) screening.** / A one-time screening for ages 65 to 75 years who are current or former smokers.  Skin self-exam. / Monthly.  Influenza immunization.** / Every year.  Pneumococcal polysaccharide immunization.** / 1 dose at age 65 (or older) if you have never been vaccinated.  Tetanus, diphtheria, pertussis (Tdap, Td) immunization. / A one-time dose of Tdap vaccine if you are over 65 and have contact with an infant, are a healthcare worker, or simply want to be protected from whooping cough. After that, you need a Td booster dose every 10 years.  Varicella immunization. ** / Consult your caregiver.  Meningococcal immunization.** / Consult your caregiver.  Hepatitis A immunization. ** / Consult your caregiver. 2 doses, 6 to 18 months apart.  Hepatitis B immunization.** / Check with your caregiver. 3 doses, usually over 6 months. **Family history and personal history of risk and conditions may change your caregiver's recommendations. Document Released: 07/29/2001 Document Revised: 08/25/2011 Document Reviewed: 10/28/2010 [employer] Patient Information 2013 [employer].  

## 2012-06-25 NOTE — Progress Notes (Signed)
Subjective:    Kevin Glass is a 79 y.o. male who presents for Medicare Annual/Subsequent preventive examination.   Preventive Screening-Counseling & Management  Tobacco History  Smoking status  . Former Smoker  . Quit date: 06/17/1983  Smokeless tobacco  . Not on file    Problems Prior to Visit 1. none  Current Problems (verified) Patient Active Problem List  Diagnosis  . COLONIC POLYPS  . IMPAIRED GLUCOSE TOLERANCE  . HEMORRHOIDS  . DIVERTICULOSIS, COLON  . HYPRTRPHY PROSTATE BNG W/O URINARY OBST/LUTS  . ARTHRITIS  . DIZZINESS AND GIDDINESS  . NONSPECIFIC ABNORMAL ELECTROCARDIOGRAM  . URI (upper respiratory infection)    Medications Prior to Visit Current Outpatient Prescriptions on File Prior to Visit  Medication Sig Dispense Refill  . acetaminophen (TYLENOL) 500 MG tablet Take 500 mg by mouth every other day. For pain      . albuterol (PROVENTIL HFA;VENTOLIN HFA) 108 (90 BASE) MCG/ACT inhaler Inhale 2 puffs into the lungs every 6 (six) hours as needed.      . Ascorbic Acid (VITAMIN C) 500 MG tablet Take 1,000 mg by mouth daily.       . Azelastine-Fluticasone 137-50 MCG/ACT SUSP Place 1 spray into the nose 2 (two) times daily.  1 Bottle  0  . calcium carbonate (OS-CAL) 600 MG TABS Take 600 mg by mouth daily.      . cetirizine (ZYRTEC) 10 MG tablet Take 1 tablet (10 mg total) by mouth daily.  30 tablet  11  . NONFORMULARY OR COMPOUNDED ITEM LS back brace Dx  Back pain  1 each  0  . Saw Palmetto 500 MG CAPS Take 1 capsule by mouth every other day.         Current Medications (verified) Current Outpatient Prescriptions  Medication Sig Dispense Refill  . acetaminophen (TYLENOL) 500 MG tablet Take 500 mg by mouth every other day. For pain      . albuterol (PROVENTIL HFA;VENTOLIN HFA) 108 (90 BASE) MCG/ACT inhaler Inhale 2 puffs into the lungs every 6 (six) hours as needed.      . Ascorbic Acid (VITAMIN C) 500 MG tablet Take 1,000 mg by mouth daily.       .  Azelastine-Fluticasone 137-50 MCG/ACT SUSP Place 1 spray into the nose 2 (two) times daily.  1 Bottle  0  . calcium carbonate (OS-CAL) 600 MG TABS Take 600 mg by mouth daily.      . cetirizine (ZYRTEC) 10 MG tablet Take 1 tablet (10 mg total) by mouth daily.  30 tablet  11  . Cholecalciferol (VITAMIN D) 2000 UNITS CAPS Take 1 capsule by mouth daily.      Tery Sanfilippo Calcium (STOOL SOFTENER PO) Take 1 tablet by mouth 3 (three) times a week.      . NONFORMULARY OR COMPOUNDED ITEM LS back brace Dx  Back pain  1 each  0  . Saw Palmetto 500 MG CAPS Take 1 capsule by mouth every other day.       . NONFORMULARY OR COMPOUNDED ITEM Lumbar support    Diagnosis low back pain  1 each  0     Allergies (verified) Penicillins   PAST HISTORY  Family History Family History  Problem Relation Age of Onset  . Cancer Mother   . Arthritis Sister     Social History History  Substance Use Topics  . Smoking status: Former Smoker    Quit date: 06/17/1983  . Smokeless tobacco: Not on file  . Alcohol Use:  No    Are there smokers in your home (other than you)?  No  Risk Factors Current exercise habits: Home exercise routine includes calisthenics and walking 1/2 hrs per day.  Dietary issues discussed: na   Cardiac risk factors: advanced age (older than 76 for men, 44 for women) and male gender.  Depression Screen (Note: if answer to either of the following is "Yes", a more complete depression screening is indicated)   Q1: Over the past two weeks, have you felt down, depressed or hopeless? No  Q2: Over the past two weeks, have you felt little interest or pleasure in doing things? No  Have you lost interest or pleasure in daily life? No  Do you often feel hopeless? No  Do you cry easily over simple problems? No  Activities of Daily Living In your present state of health, do you have any difficulty performing the following activities?:  Driving? No Managing money?  No Feeding yourself? No Getting  from bed to chair? No Climbing a flight of stairs? No Preparing food and eating?: No Bathing or showering? No Getting dressed: No Getting to the toilet? No Using the toilet:No Moving around from place to place: No In the past year have you fallen or had a near fall?:No   Are you sexually active?  Yes  Do you have more than one partner?  No  Hearing Difficulties: Yes Do you often ask people to speak up or repeat themselves? Yes Do you experience ringing or noises in your ears? No Do you have difficulty understanding soft or whispered voices? Yes   Do you feel that you have a problem with memory? No  Do you often misplace items? No  Do you feel safe at home?  Yes  Cognitive Testing  Alert? Yes  Normal Appearance?Yes  Oriented to person? Yes  Place? Yes   Time? Yes  Recall of three objects?  Yes  Can perform simple calculations? Yes  Displays appropriate judgment?Yes  Can read the correct time from a watch face?Yes   Advanced Directives have been discussed with the patient? Yes   List the Names of Other Physician/Practitioners you currently use: 1.  opth--dolan 2  Dentist--reaves 3. Gruber--derm Indicate any recent Medical Services you may have received from other than [facility] providers in the past year (date may be approximate).  Immunization History  Administered Date(s) Administered  . Pneumococcal Polysaccharide 01/18/2008  . Td 07/31/2004    Screening Tests Health Maintenance  Topic Date Due  . Influenza Vaccine  02/14/2013  . Tetanus/tdap  07/31/2014  . Colonoscopy  04/25/2017  . Pneumococcal Polysaccharide Vaccine Age 57 And Over  Completed  . Zostavax  Completed    All answers were reviewed with the patient and necessary referrals were made:  Loreen Freud, DO   06/25/2012   History reviewed:  He  has a past medical history of Colonic polyp; Arthritis; Hemorrhoid; and BPH (benign prostatic hypertrophy). He  does not have any pertinent problems on file. He   has past surgical history that includes Total knee arthroplasty (2003) and Hemorrhoid surgery. His family history includes Arthritis in his sister and Cancer in his mother. He  reports that he quit smoking about 29 years ago. He does not have any smokeless tobacco history on file. He reports that he does not drink alcohol or use illicit drugs. He has a current medication list which includes the following prescription(s): acetaminophen, albuterol, vitamin c, azelastine-fluticasone, calcium carbonate, cetirizine, vitamin d, docusate calcium, NONFORMULARY  OR COMPOUNDED ITEM, saw palmetto, and NONFORMULARY OR COMPOUNDED ITEM. Current Outpatient Prescriptions on File Prior to Visit  Medication Sig Dispense Refill  . acetaminophen (TYLENOL) 500 MG tablet Take 500 mg by mouth every other day. For pain      . albuterol (PROVENTIL HFA;VENTOLIN HFA) 108 (90 BASE) MCG/ACT inhaler Inhale 2 puffs into the lungs every 6 (six) hours as needed.      . Ascorbic Acid (VITAMIN C) 500 MG tablet Take 1,000 mg by mouth daily.       . Azelastine-Fluticasone 137-50 MCG/ACT SUSP Place 1 spray into the nose 2 (two) times daily.  1 Bottle  0  . calcium carbonate (OS-CAL) 600 MG TABS Take 600 mg by mouth daily.      . cetirizine (ZYRTEC) 10 MG tablet Take 1 tablet (10 mg total) by mouth daily.  30 tablet  11  . NONFORMULARY OR COMPOUNDED ITEM LS back brace Dx  Back pain  1 each  0  . Saw Palmetto 500 MG CAPS Take 1 capsule by mouth every other day.        He is allergic to penicillins.  Review of Systems  Review of Systems  Constitutional: Negative for activity change, appetite change and fatigue.  HENT: Negative for hearing loss, congestion, tinnitus and ear discharge.   Eyes: Negative for visual disturbance (see optho q1y -- vision corrected to 20/20 with glasses).  Respiratory: Negative for cough, chest tightness and shortness of breath.   Cardiovascular: Negative for chest pain, palpitations and leg swelling.    Gastrointestinal: Negative for abdominal pain, diarrhea, constipation and abdominal distention.  Genitourinary: Negative for urgency, frequency, decreased urine volume and difficulty urinating.  Musculoskeletal: Negative for back pain, arthralgias and gait problem.  Skin: Negative for color change, pallor and rash.  Neurological: Negative for dizziness, light-headedness, numbness and headaches.  Hematological: Negative for adenopathy. Does not bruise/bleed easily.  Psychiatric/Behavioral: Negative for suicidal ideas, confusion, sleep disturbance, self-injury, dysphoric mood, decreased concentration and agitation.  Pt is able to read and write and can do all ADLs No risk for falling No abuse/ violence in home      Objective:     Vision by Snellen chart: opth    Blood pressure 116/70, pulse 80, temperature 98 F (36.7 C), temperature source Oral, height 5\' 9"  (1.753 m), weight 217 lb (98.431 kg), SpO2 96.00%. Body mass index is 32.05 kg/(m^2).  BP 116/70  Pulse 80  Temp 98 F (36.7 C) (Oral)  Ht 5\' 9"  (1.753 m)  Wt 217 lb (98.431 kg)  BMI 32.05 kg/m2  SpO2 96% General appearance: alert, cooperative, appears stated age and no distress Head: Normocephalic, without obvious abnormality, atraumatic Eyes: conjunctivae/corneas clear. PERRL, EOM's intact. Fundi benign. Ears: normal TM's and external ear canals both ears Nose: Nares normal. Septum midline. Mucosa normal. No drainage or sinus tenderness. Throat: lips, mucosa, and tongue normal; teeth and gums normal Neck: no adenopathy, no carotid bruit, no JVD, supple, symmetrical, trachea midline and thyroid not enlarged, symmetric, no tenderness/mass/nodules Back: kyphosis Lungs: clear to auscultation bilaterally Chest wall: no tenderness Heart: regular rate and rhythm, S1, S2 normal, no murmur, click, rub or gallop Abdomen: soft, non-tender; bowel sounds normal; no masses,  no organomegaly Male genitalia: pt refused Rectal: pt  refused Extremities: extremities normal, atraumatic, no cyanosis or edema Pulses: 2+ and symmetric Skin: Skin color, texture, turgor normal. No rashes or lesions Lymph nodes: Cervical, supraclavicular, and axillary nodes normal. Neurologic: Alert and oriented X 3, normal  strength and tone. Normal symmetric reflexes. Normal coordination and gait Psych-- no depression, no anxiety     Assessment:     cpe     Plan:     During the course of the visit the patient was educated and counseled about appropriate screening and preventive services including:    Pneumococcal vaccine   Influenza vaccine  Hepatitis B vaccine  Td vaccine  Prostate cancer screening  Colorectal cancer screening  Glaucoma screening  Advanced directives: has an advanced directive - a copy HAS NOT been provided.  Diet review for nutrition referral? Yes ____  Not Indicated _x___   Patient Instructions (the written plan) was given to the patient.  Medicare Attestation I have personally reviewed: The patient's medical and social history Their use of alcohol, tobacco or illicit drugs Their current medications and supplements The patient's functional ability including ADLs,fall risks, home safety risks, cognitive, and hearing and visual impairment Diet and physical activities Evidence for depression or mood disorders  The patient's weight, height, BMI, and visual acuity have been recorded in the chart.  I have made referrals, counseling, and provided education to the patient based on review of the above and I have provided the patient with a written personalized care plan for preventive services.     Loreen Freud, DO   06/25/2012

## 2012-06-28 LAB — POCT URINALYSIS DIPSTICK
Leukocytes, UA: NEGATIVE
Protein, UA: NEGATIVE
Urobilinogen, UA: 0.2
pH, UA: 7

## 2013-04-21 ENCOUNTER — Other Ambulatory Visit: Payer: Self-pay

## 2013-05-27 ENCOUNTER — Ambulatory Visit (INDEPENDENT_AMBULATORY_CARE_PROVIDER_SITE_OTHER): Payer: Medicare Other | Admitting: Family Medicine

## 2013-05-27 ENCOUNTER — Encounter: Payer: Self-pay | Admitting: Family Medicine

## 2013-05-27 VITALS — BP 108/64 | HR 95 | Temp 98.1°F | Ht 69.0 in | Wt 213.6 lb

## 2013-05-27 DIAGNOSIS — E669 Obesity, unspecified: Secondary | ICD-10-CM | POA: Insufficient documentation

## 2013-05-27 DIAGNOSIS — Z0000 Encounter for general adult medical examination without abnormal findings: Secondary | ICD-10-CM

## 2013-05-27 DIAGNOSIS — Z136 Encounter for screening for cardiovascular disorders: Secondary | ICD-10-CM

## 2013-05-27 DIAGNOSIS — Z23 Encounter for immunization: Secondary | ICD-10-CM

## 2013-05-27 DIAGNOSIS — N400 Enlarged prostate without lower urinary tract symptoms: Secondary | ICD-10-CM

## 2013-05-27 HISTORY — DX: Obesity, unspecified: E66.9

## 2013-05-27 LAB — POCT URINALYSIS DIPSTICK
Blood, UA: NEGATIVE
Nitrite, UA: NEGATIVE
Spec Grav, UA: <=1.005
Urobilinogen, UA: 0.2
pH, UA: 8

## 2013-05-27 LAB — LIPID PANEL
Cholesterol: 190 mg/dL (ref 0–200)
HDL: 40 mg/dL (ref >39)
Total CHOL/HDL Ratio: 4.8 Ratio
Triglycerides: 245 mg/dL — ABNORMAL HIGH (ref <150)

## 2013-05-27 LAB — HEPATIC FUNCTION PANEL
Albumin: 4.6 g/dL (ref 3.5–5.2)
Alkaline Phosphatase: 62 U/L (ref 39–117)
Total Bilirubin: 0.7 mg/dL (ref 0.3–1.2)
Total Protein: 7.6 g/dL (ref 6.0–8.3)

## 2013-05-27 LAB — BASIC METABOLIC PANEL
BUN: 14 mg/dL (ref 6–23)
Chloride: 100 mEq/L (ref 96–112)
Creat: 0.89 mg/dL (ref 0.50–1.35)
Glucose, Bld: 89 mg/dL (ref 70–99)

## 2013-05-27 LAB — CBC WITH DIFFERENTIAL/PLATELET
Basophils Absolute: 0 10*3/uL (ref 0.0–0.1)
Basophils Relative: 0 % (ref 0–1)
Eosinophils Absolute: 0.3 10*3/uL (ref 0.0–0.7)
Eosinophils Relative: 3 % (ref 0–5)
Lymphs Abs: 4 10*3/uL (ref 0.7–4.0)
MCH: 33.1 pg (ref 26.0–34.0)
MCV: 93.4 fL (ref 78.0–100.0)
Neutrophils Relative %: 49 % (ref 43–77)
Platelets: 221 10*3/uL (ref 150–400)
RBC: 4.86 MIL/uL (ref 4.22–5.81)
RDW: 13.6 % (ref 11.5–15.5)

## 2013-05-27 NOTE — Patient Instructions (Signed)
 Preventive Care for Adults, Male A healthy lifestyle and preventive care can promote health and wellness. Preventive health guidelines for men include the following key practices:  A routine yearly physical is a good way to check with your caregiver about your health and preventative screening. It is a chance to share any concerns and updates on your health, and to receive a thorough exam.  Visit your dentist for a routine exam and preventative care every 6 months. Brush your teeth twice a day and floss once a day. Good oral hygiene prevents tooth decay and gum disease.  The frequency of eye exams is based on your age, health, family medical history, use of contact lenses, and other factors. Follow your caregiver's recommendations for frequency of eye exams.  Eat a healthy diet. Foods like vegetables, fruits, whole grains, low-fat dairy products, and lean protein foods contain the nutrients you need without too many calories. Decrease your intake of foods high in solid fats, added sugars, and salt. Eat the right amount of calories for you.Get information about a proper diet from your caregiver, if necessary.  Regular physical exercise is one of the most important things you can do for your health. Most adults should get at least 150 minutes of moderate-intensity exercise (any activity that increases your heart rate and causes you to sweat) each week. In addition, most adults need muscle-strengthening exercises on 2 or more days a week.  Maintain a healthy weight. The body mass index (BMI) is a screening tool to identify possible weight problems. It provides an estimate of body fat based on height and weight. Your caregiver can help determine your BMI, and can help you achieve or maintain a healthy weight.For adults 20 years and older:  A BMI below 18.5 is considered underweight.  A BMI of 18.5 to 24.9 is normal.  A BMI of 25 to 29.9 is considered overweight.  A BMI of 30 and above is  considered obese.  Maintain normal blood lipids and cholesterol levels by exercising and minimizing your intake of saturated fat. Eat a balanced diet with plenty of fruit and vegetables. Blood tests for lipids and cholesterol should begin at age 20 and be repeated every 5 years. If your lipid or cholesterol levels are high, you are over 50, or you are a high risk for heart disease, you may need your cholesterol levels checked more frequently.Ongoing high lipid and cholesterol levels should be treated with medicines if diet and exercise are not effective.  If you smoke, find out from your caregiver how to quit. If you do not use tobacco, do not start.  Lung cancer screening is recommended for adults aged 55 80 years who are at high risk for developing lung cancer because of a history of smoking. Yearly low-dose computed tomography (CT) is recommended for people who have at least a 30-pack-year history of smoking and are a current smoker or have quit within the past 15 years. A pack year of smoking is smoking an average of 1 pack of cigarettes a day for 1 year (for example: 1 pack a day for 30 years or 2 packs a day for 15 years). Yearly screening should continue until the smoker has stopped smoking for at least 15 years. Yearly screening should also be stopped for people who develop a health problem that would prevent them from having lung cancer treatment.  If you choose to drink alcohol, do not exceed 2 drinks per day. One drink is considered to be 12   ounces (355 mL) of beer, 5 ounces (148 mL) of wine, or 1.5 ounces (44 mL) of liquor.  Avoid use of street drugs. Do not share needles with anyone. Ask for help if you need support or instructions about stopping the use of drugs.  High blood pressure causes heart disease and increases the risk of stroke. Your blood pressure should be checked at least every 1 to 2 years. Ongoing high blood pressure should be treated with medicines, if weight loss and  exercise are not effective.  If you are 45 to 77 years old, ask your caregiver if you should take aspirin to prevent heart disease.  Diabetes screening involves taking a blood sample to check your fasting blood sugar level. This should be done once every 3 years, after age 45, if you are within normal weight and without risk factors for diabetes. Testing should be considered at a younger age or be carried out more frequently if you are overweight and have at least 1 risk factor for diabetes.  Colorectal cancer can be detected and often prevented. Most routine colorectal cancer screening begins at the age of 50 and continues through age 75. However, your caregiver may recommend screening at an earlier age if you have risk factors for colon cancer. On a yearly basis, your caregiver may provide home test kits to check for hidden blood in the stool. Use of a small camera at the end of a tube, to directly examine the colon (sigmoidoscopy or colonoscopy), can detect the earliest forms of colorectal cancer. Talk to your caregiver about this at age 50, when routine screening begins. Direct examination of the colon should be repeated every 5 to 10 years through age 75, unless early forms of pre-cancerous polyps or small growths are found.  Hepatitis C blood testing is recommended for all people born from 1945 through 1965 and any individual with known risks for hepatitis C.  Practice safe sex. Use condoms and avoid high-risk sexual practices to reduce the spread of sexually transmitted infections (STIs). STIs include gonorrhea, chlamydia, syphilis, trichomonas, herpes, HPV, and human immunodeficiency virus (HIV). Herpes, HIV, and HPV are viral illnesses that have no cure. They can result in disability, cancer, and death.  A one-time screening for abdominal aortic aneurysm (AAA) and surgical repair of large AAAs by sound wave imaging (ultrasonography) is recommended for ages 65 to 75 years who are current or  former smokers.  Healthy men should no longer receive prostate-specific antigen (PSA) blood tests as part of routine cancer screening. Consult with your caregiver about prostate cancer screening.  Testicular cancer screening is not recommended for adult males who have no symptoms. Screening includes self-exam, caregiver exam, and other screening tests. Consult with your caregiver about any symptoms you have or any concerns you have about testicular cancer.  Use sunscreen. Apply sunscreen liberally and repeatedly throughout the day. You should seek shade when your shadow is shorter than you. Protect yourself by wearing long sleeves, pants, a wide-brimmed hat, and sunglasses year round, whenever you are outdoors.  Once a month, do a whole body skin exam, using a mirror to look at the skin on your back. Notify your caregiver of new moles, moles that have irregular borders, moles that are larger than a pencil eraser, or moles that have changed in shape or color.  Stay current with required immunizations.  Influenza vaccine. All adults should be immunized every year.  Tetanus, diphtheria, and acellular pertussis (Td, Tdap) vaccine. An adult who has not   previously received Tdap or who does not know his vaccine status should receive 1 dose of Tdap. This initial dose should be followed by tetanus and diphtheria toxoids (Td) booster doses every 10 years. Adults with an unknown or incomplete history of completing a 3-dose immunization series with Td-containing vaccines should begin or complete a primary immunization series including a Tdap dose. Adults should receive a Td booster every 10 years.  Varicella vaccine. An adult without evidence of immunity to varicella should receive 2 doses or a second dose if he has previously received 1 dose.  Human papillomavirus (HPV) vaccine. Males aged 13 21 years who have not received the vaccine previously should receive the 3-dose series. Males aged 22 26 years may be  immunized. Immunization is recommended through the age of 26 years for any male who has sex with males and did not get any or all doses earlier. Immunization is recommended for any person with an immunocompromised condition through the age of 26 years if he did not get any or all doses earlier. During the 3-dose series, the second dose should be obtained 4 8 weeks after the first dose. The third dose should be obtained 24 weeks after the first dose and 16 weeks after the second dose.  Zoster vaccine. One dose is recommended for adults aged 60 years or older unless certain conditions are present.  Measles, mumps, and rubella (MMR) vaccine. Adults born before 1957 generally are considered immune to measles and mumps. Adults born in 1957 or later should have 1 or more doses of MMR vaccine unless there is a contraindication to the vaccine or there is laboratory evidence of immunity to each of the three diseases. A routine second dose of MMR vaccine should be obtained at least 28 days after the first dose for students attending postsecondary schools, health care workers, or international travelers. People who received inactivated measles vaccine or an unknown type of measles vaccine during 1963 1967 should receive 2 doses of MMR vaccine. People who received inactivated mumps vaccine or an unknown type of mumps vaccine before 1979 and are at high risk for mumps infection should consider immunization with 2 doses of MMR vaccine. Unvaccinated health care workers born before 1957 who lack laboratory evidence of measles, mumps, or rubella immunity or laboratory confirmation of disease should consider measles and mumps immunization with 2 doses of MMR vaccine or rubella immunization with 1 dose of MMR vaccine.  Pneumococcal 13-valent conjugate (PCV13) vaccine. When indicated, a person who is uncertain of his immunization history and has no record of immunization should receive the PCV13 vaccine. An adult aged 19 years or  older who has certain medical conditions and has not been previously immunized should receive 1 dose of PCV13 vaccine. This PCV13 should be followed with a dose of pneumococcal polysaccharide (PPSV23) vaccine. The PPSV23 vaccine dose should be obtained at least 8 weeks after the dose of PCV13 vaccine. An adult aged 19 years or older who has certain medical conditions and previously received 1 or more doses of PPSV23 vaccine should receive 1 dose of PCV13. The PCV13 vaccine dose should be obtained 1 or more years after the last PPSV23 vaccine dose.  Pneumococcal polysaccharide (PPSV23) vaccine. When PCV13 is also indicated, PCV13 should be obtained first. All adults aged 65 years and older should be immunized. An adult younger than age 65 years who has certain medical conditions should be immunized. Any person who resides in a nursing home or long-term care facility should be   immunized. An adult smoker should be immunized. People with an immunocompromised condition and certain other conditions should receive both PCV13 and PPSV23 vaccines. People with human immunodeficiency virus (HIV) infection should be immunized as soon as possible after diagnosis. Immunization during chemotherapy or radiation therapy should be avoided. Routine use of PPSV23 vaccine is not recommended for American Indians, [place] Natives, or people younger than 65 years unless there are medical conditions that require PPSV23 vaccine. When indicated, people who have unknown immunization and have no record of immunization should receive PPSV23 vaccine. One-time revaccination 5 years after the first dose of PPSV23 is recommended for people aged 19 64 years who have chronic kidney failure, nephrotic syndrome, asplenia, or immunocompromised conditions. People who received 1 2 doses of PPSV23 before age 65 years should receive another dose of PPSV23 vaccine at age 65 years or later if at least 5 years have passed since the previous dose. Doses of  PPSV23 are not needed for people immunized with PPSV23 at or after age 65 years.  Meningococcal vaccine. Adults with asplenia or persistent complement component deficiencies should receive 2 doses of quadrivalent meningococcal conjugate (MenACWY-D) vaccine. The doses should be obtained at least 2 months apart. Microbiologists working with certain meningococcal bacteria, military recruits, people at risk during an outbreak, and people who travel to or live in countries with a high rate of meningitis should be immunized. A first-year college student up through age 21 years who is living in a residence hall should receive a dose if he did not receive a dose on or after his 16th birthday. Adults who have certain high-risk conditions should receive one or more doses of vaccine.  Hepatitis A vaccine. Adults who wish to be protected from this disease, have certain high-risk conditions, work with hepatitis A-infected animals, work in hepatitis A research labs, or travel to or work in countries with a high rate of hepatitis A should be immunized. Adults who were previously unvaccinated and who anticipate close contact with an international adoptee during the first 60 days after arrival in the [place] from a country with a high rate of hepatitis A should be immunized.  Hepatitis B vaccine. Adults who wish to be protected from this disease, have certain high-risk conditions, may be exposed to blood or other infectious body fluids, are household contacts or sex partners of hepatitis B positive people, are clients or workers in certain care facilities, or travel to or work in countries with a high rate of hepatitis B should be immunized.  Haemophilus influenzae type b (Hib) vaccine. A previously unvaccinated person with asplenia or sickle cell disease or having a scheduled splenectomy should receive 1 dose of Hib vaccine. Regardless of previous immunization, a recipient of a hematopoietic stem cell transplant  should receive a 3-dose series 6 12 months after his successful transplant. Hib vaccine is not recommended for adults with HIV infection. Preventive Service / Frequency Ages 19 to 39  Blood pressure check.** / Every 1 to 2 years.  Lipid and cholesterol check.** / Every 5 years beginning at age 20.  Hepatitis C blood test.** / For any individual with known risks for hepatitis C.  Skin self-exam. / Monthly.  Influenza vaccine. / Every year.  Tetanus, diphtheria, and acellular pertussis (Tdap, Td) vaccine.** / Consult your caregiver. 1 dose of Td every 10 years.  Varicella vaccine.** / Consult your caregiver.  HPV vaccine. / 3 doses over 6 months, if 26 and younger.  Measles, mumps, rubella (MMR) vaccine.** /   You need at least 1 dose of MMR if you were born in 1957 or later. You may also need a 2nd dose.  Pneumococcal 13-valent conjugate (PCV13) vaccine.** / Consult your caregiver.  Pneumococcal polysaccharide (PPSV23) vaccine.** / 1 to 2 doses if you smoke cigarettes or if you have certain conditions.  Meningococcal vaccine.** / 1 dose if you are age 19 to 21 years and a first-year college student living in a residence hall, or have one of several medical conditions, you need to get vaccinated against meningococcal disease. You may also need additional booster doses.  Hepatitis A vaccine.** / Consult your caregiver.  Hepatitis B vaccine.** / Consult your caregiver.  Haemophilus influenzae type b (Hib) vaccine.** / Consult your caregiver. Ages 40 to 64  Blood pressure check.** / Every 1 to 2 years.  Lipid and cholesterol check.** / Every 5 years beginning at age 20.  Lung cancer screening. / Every year if you are aged 55 80 years and have a 30-pack-year history of smoking and currently smoke or have quit within the past 15 years. Yearly screening is stopped once you have quit smoking for at least 15 years or develop a health problem that would prevent you from having lung cancer  treatment.  Fecal occult blood test (FOBT) of stool. / Every year beginning at age 50 and continuing until age 75. You may not have to do this test if you get colonoscopy every 10 years.  Flexible sigmoidoscopy** or colonoscopy.** / Every 5 years for a flexible sigmoidoscopy or every 10 years for a colonoscopy beginning at age 50 and continuing until age 75.  Hepatitis C blood test.** / For all people born from 1945 through 1965 and any individual with known risks for hepatitis C.  Skin self-exam. / Monthly.  Influenza vaccine. / Every year.  Tetanus, diphtheria, and acellular pertussis (Tdap/Td) vaccine.** / Consult your caregiver. 1 dose of Td every 10 years.  Varicella vaccine.** / Consult your caregiver.  Zoster vaccine.** / 1 dose for adults aged 60 years or older.  Measles, mumps, rubella (MMR) vaccine.** / You need at least 1 dose of MMR if you were born in 1957 or later. You may also need a 2nd dose.  Pneumococcal 13-valent conjugate (PCV13) vaccine.** / Consult your caregiver.  Pneumococcal polysaccharide (PPSV23) vaccine.** / 1 to 2 doses if you smoke cigarettes or if you have certain conditions.  Meningococcal vaccine.** / Consult your caregiver.  Hepatitis A vaccine.** / Consult your caregiver.  Hepatitis B vaccine.** / Consult your caregiver.  Haemophilus influenzae type b (Hib) vaccine.** / Consult your caregiver. Ages 65 and over  Blood pressure check.** / Every 1 to 2 years.  Lipid and cholesterol check.**/ Every 5 years beginning at age 20.  Lung cancer screening. / Every year if you are aged 55 80 years and have a 30-pack-year history of smoking and currently smoke or have quit within the past 15 years. Yearly screening is stopped once you have quit smoking for at least 15 years or develop a health problem that would prevent you from having lung cancer treatment.  Fecal occult blood test (FOBT) of stool. / Every year beginning at age 50 and continuing until  age 75. You may not have to do this test if you get colonoscopy every 10 years.  Flexible sigmoidoscopy** or colonoscopy.** / Every 5 years for a flexible sigmoidoscopy or every 10 years for a colonoscopy beginning at age 50 and continuing until age 75.  Hepatitis C blood   test.** / For all people born from 1945 through 1965 and any individual with known risks for hepatitis C.  Abdominal aortic aneurysm (AAA) screening.** / A one-time screening for ages 65 to 75 years who are current or former smokers.  Skin self-exam. / Monthly.  Influenza vaccine. / Every year.  Tetanus, diphtheria, and acellular pertussis (Tdap/Td) vaccine.** / 1 dose of Td every 10 years.  Varicella vaccine.** / Consult your caregiver.  Zoster vaccine.** / 1 dose for adults aged 60 years or older.  Pneumococcal 13-valent conjugate (PCV13) vaccine.** / Consult your caregiver.  Pneumococcal polysaccharide (PPSV23) vaccine.** / 1 dose for all adults aged 65 years and older.  Meningococcal vaccine.** / Consult your caregiver.  Hepatitis A vaccine.** / Consult your caregiver.  Hepatitis B vaccine.** / Consult your caregiver.  Haemophilus influenzae type b (Hib) vaccine.** / Consult your caregiver. **Family history and personal history of risk and conditions may change your caregiver's recommendations. Document Released: 07/29/2001 Document Revised: 09/27/2012 Document Reviewed: 10/28/2010 [employer] Patient Information 2014 [employer].  

## 2013-05-27 NOTE — Progress Notes (Signed)
Pre visit review using our clinic review tool, if applicable. No additional management support is needed unless otherwise documented below in the visit note. 

## 2013-05-28 ENCOUNTER — Encounter: Payer: Self-pay | Admitting: Family Medicine

## 2013-05-28 LAB — PSA: PSA: 1.45 ng/mL (ref <=4.00)

## 2013-05-28 NOTE — Progress Notes (Signed)
Subjective:    Patient ID: Kevin Glass, male    DOB: 18-Nov-1922, 79 y.o.   MRN: [id number]  HPI Pt here for physical. No complaints    Review of Systems Review of Systems  Constitutional: Negative for activity change, appetite change and fatigue.  HENT: Negative for hearing loss, congestion, tinnitus and ear discharge.  dentist q10m Eyes: Negative for visual disturbance (see optho q1y -- vision corrected to 20/20 with glasses).  Respiratory: Negative for cough, chest tightness and shortness of breath.   Cardiovascular: Negative for chest pain, palpitations and leg swelling.  Gastrointestinal: Negative for abdominal pain, diarrhea, constipation and abdominal distention.  Genitourinary: Negative for urgency, frequency, decreased urine volume and difficulty urinating.  Musculoskeletal: Negative for back pain, arthralgias and gait problem.  Skin: Negative for color change, pallor and rash.  Neurological: Negative for dizziness, light-headedness, numbness and headaches.  Hematological: Negative for adenopathy. Does not bruise/bleed easily.  Psychiatric/Behavioral: Negative for suicidal ideas, confusion, sleep disturbance, self-injury, dysphoric mood, decreased concentration and agitation.     Past Medical History  Diagnosis Date  . Colonic polyp   . Arthritis   . Hemorrhoid   . BPH (benign prostatic hypertrophy)    Current Outpatient Prescriptions on File Prior to Visit  Medication Sig Dispense Refill  . acetaminophen (TYLENOL) 500 MG tablet Take 500 mg by mouth every other day. For pain      . Ascorbic Acid (VITAMIN C) 500 MG tablet Take 1,000 mg by mouth daily.       . calcium carbonate (OS-CAL) 600 MG TABS Take 600 mg by mouth daily.      . Cholecalciferol (VITAMIN D) 2000 UNITS CAPS Take 1 capsule by mouth daily.      Tery Sanfilippo Calcium (STOOL SOFTENER PO) Take 1 tablet by mouth 3 (three) times a week.      . NONFORMULARY OR COMPOUNDED ITEM LS back brace Dx  Back pain  1  each  0  . Saw Palmetto 500 MG CAPS Take 1 capsule by mouth every other day.        No current facility-administered medications on file prior to visit.   Family History  Problem Relation Age of Onset  . Cancer Mother   . Arthritis Sister    History   Social History  . Marital Status: Married    Spouse Name: N/A    Number of Children: N/A  . Years of Education: N/A   Occupational History  . retired    Social History Main Topics  . Smoking status: Former Smoker    Quit date: 06/17/1983  . Smokeless tobacco: Not on file  . Alcohol Use: No  . Drug Use: No  . Sexual Activity: Yes    Partners: Female   Other Topics Concern  . Not on file   Social History Narrative  . No narrative on file   .    [name]   Physical Exam  BP 108/64  Pulse 95  Temp(Src) 98.1 F (36.7 C) (Oral)  Ht 5\' 9"  (1.753 m)  Wt 213 lb 9.6 oz (96.888 kg)  BMI 31.53 kg/m2  SpO2 98% General appearance: alert, cooperative, appears stated age and no distress Head: Normocephalic, without obvious abnormality, atraumatic Eyes: negative findings: lids and lashes normal and pupils equal, round, reactive to light and accomodation Ears: normal TM's and external ear canals both ears Nose: Nares normal. Septum midline. Mucosa normal. No drainage or sinus tenderness. Throat: lips, mucosa, and tongue normal; teeth and gums  normal Neck: no adenopathy, no carotid bruit, no JVD, supple, symmetrical, trachea midline and thyroid not enlarged, symmetric, no tenderness/mass/nodules Back: symmetric, no curvature. ROM normal. No CVA tenderness. Lungs: clear to auscultation bilaterally Chest wall: no tenderness Heart: S1, S2 normal Abdomen: soft, non-tender; bowel sounds normal; no masses,  no organomegaly Male genitalia: normal, penis: no lesions or discharge. testes: no masses or tenderness. no hernias Rectal: soft brown guaiac negative stool noted and prostate in enlarged Extremities: extremities normal, atraumatic, no  cyanosis or edema Pulses: 2+ and symmetric Skin: mult SK Lymph nodes: Cervical, supraclavicular, and axillary nodes normal. Neurologic: Alert and oriented X 3, normal strength and tone. Normal symmetric reflexes. Normal coordination and gait       Assessment & Plan:  cpe-- check labs            ghm utd          See AVS

## 2013-07-20 ENCOUNTER — Encounter: Payer: Self-pay | Admitting: Family Medicine

## 2013-07-20 ENCOUNTER — Ambulatory Visit (INDEPENDENT_AMBULATORY_CARE_PROVIDER_SITE_OTHER): Payer: Medicare Other | Admitting: Family Medicine

## 2013-07-20 VITALS — BP 120/70 | HR 102 | Temp 98.2°F | Wt 214.0 lb

## 2013-07-20 DIAGNOSIS — K219 Gastro-esophageal reflux disease without esophagitis: Secondary | ICD-10-CM

## 2013-07-20 DIAGNOSIS — K589 Irritable bowel syndrome without diarrhea: Secondary | ICD-10-CM

## 2013-07-20 LAB — CBC WITH DIFFERENTIAL/PLATELET
Basophils Absolute: 0 10*3/uL (ref 0.0–0.1)
Basophils Relative: 0.4 % (ref 0.0–3.0)
EOS PCT: 3.1 % (ref 0.0–5.0)
Eosinophils Absolute: 0.2 10*3/uL (ref 0.0–0.7)
HEMATOCRIT: 46.2 % (ref 39.0–52.0)
HEMOGLOBIN: 15.6 g/dL (ref 13.0–17.0)
LYMPHS ABS: 3 10*3/uL (ref 0.7–4.0)
Lymphocytes Relative: 43.2 % (ref 12.0–46.0)
MCHC: 33.7 g/dL (ref 30.0–36.0)
MCV: 99.2 fl (ref 78.0–100.0)
MONOS PCT: 6.3 % (ref 3.0–12.0)
Monocytes Absolute: 0.4 10*3/uL (ref 0.1–1.0)
NEUTROS ABS: 3.3 10*3/uL (ref 1.4–7.7)
Neutrophils Relative %: 47 % (ref 43.0–77.0)
PLATELETS: 178 10*3/uL (ref 150.0–400.0)
RBC: 4.65 Mil/uL (ref 4.22–5.81)
RDW: 13.3 % (ref 11.5–14.6)
WBC: 6.9 10*3/uL (ref 4.5–10.5)

## 2013-07-20 LAB — H. PYLORI ANTIBODY, IGG: H PYLORI IGG: NEGATIVE

## 2013-07-20 MED ORDER — ESOMEPRAZOLE MAGNESIUM 40 MG PO CPDR: 40.0000 mg | DELAYED_RELEASE_CAPSULE | Freq: Every day | ORAL | Status: DC

## 2013-07-20 NOTE — Patient Instructions (Signed)
Diet for Gastroesophageal Reflux Disease, Adult Reflux (acid reflux) is when acid from your stomach flows up into the esophagus. When acid comes in contact with the esophagus, the acid causes irritation and soreness (inflammation) in the esophagus. When reflux happens often or so severely that it causes damage to the esophagus, it is called gastroesophageal reflux disease (GERD). Nutrition therapy can help ease the discomfort of GERD. FOODS OR DRINKS TO AVOID OR LIMIT  Smoking or chewing tobacco. Nicotine is one of the most potent stimulants to acid production in the gastrointestinal tract.  Caffeinated and decaffeinated coffee and black tea.  Regular or low-calorie carbonated beverages or energy drinks (caffeine-free carbonated beverages are allowed).   Strong spices, such as black pepper, white pepper, red pepper, cayenne, curry powder, and chili powder.  Peppermint or spearmint.  Chocolate.  High-fat foods, including meats and fried foods. Extra added fats including oils, butter, salad dressings, and nuts. Limit these to less than 8 tsp per day.  Fruits and vegetables if they are not tolerated, such as citrus fruits or tomatoes.  Alcohol.  Any food that seems to aggravate your condition. If you have questions regarding your diet, call your caregiver or a registered dietitian. OTHER THINGS THAT MAY HELP GERD INCLUDE:   Eating your meals slowly, in a relaxed setting.  Eating 5 to 6 small meals per day instead of 3 large meals.  Eliminating food for a period of time if it causes distress.  Not lying down until 3 hours after eating a meal.  Keeping the head of your bed raised 6 to 9 inches (15 to 23 cm) by using a foam wedge or blocks under the legs of the bed. Lying flat may make symptoms worse.  Being physically active. Weight loss may be helpful in reducing reflux in overweight or obese adults.  Wear loose fitting clothing EXAMPLE MEAL PLAN This meal plan is approximately  2,000 calories based on [email or web address] meal planning guidelines. Breakfast   cup cooked oatmeal.  1 cup strawberries.  1 cup low-fat milk.  1 oz almonds. Snack  1 cup cucumber slices.  6 oz yogurt (made from low-fat or fat-free milk). Lunch  2 slice whole-wheat bread.  2 oz sliced [place].  2 tsp mayonnaise.  1 cup blueberries.  1 cup snap peas. Snack  6 whole-wheat crackers.  1 oz string cheese. Dinner   cup brown rice.  1 cup mixed veggies.  1 tsp olive oil.  3 oz grilled fish. Document Released: 06/02/2005 Document Revised: 08/25/2011 Document Reviewed: 04/18/2011 [facility] Patient Information 2014 [place], [place].  Diverticulosis Diverticulosis is a common condition that develops when small pouches (diverticula) form in the wall of the colon. The risk of diverticulosis increases with age. It happens more often in people who eat a low-fiber diet. Most individuals with diverticulosis have no symptoms. Those individuals with symptoms usually experience abdominal pain, constipation, or loose stools (diarrhea). HOME CARE INSTRUCTIONS   Increase the amount of fiber in your diet as directed by your caregiver or dietician. This may reduce symptoms of diverticulosis.  Your caregiver may recommend taking a dietary fiber supplement.  Drink at least 6 to 8 glasses of water each day to prevent constipation.  Try not to strain when you have a bowel movement.  Your caregiver may recommend avoiding nuts and seeds to prevent complications, although this is still an uncertain benefit.  Only take over-the-counter or prescription medicines for pain, discomfort, or fever as directed by your caregiver. FOODS WITH HIGH  FIBER CONTENT INCLUDE:  Fruits. Apple, peach, pear, tangerine, raisins, prunes.  Vegetables. Brussels sprouts, asparagus, broccoli, cabbage, carrot, cauliflower, romaine lettuce, spinach, summer squash, tomato, winter squash, zucchini.  Starchy  Vegetables. Baked beans, kidney beans, lima beans, split peas, lentils, potatoes (with skin).  Grains. Whole wheat bread, brown rice, bran flake cereal, plain oatmeal, white rice, shredded wheat, bran muffins. SEEK IMMEDIATE MEDICAL CARE IF:   You develop increasing pain or severe bloating.  You have an oral temperature above 102 F (38.9 C), not controlled by medicine.  You develop vomiting or bowel movements that are bloody or black. Document Released: 02/28/2004 Document Revised: 08/25/2011 Document Reviewed: 10/31/2009 [facility] Patient Information 2014 [place].

## 2013-07-20 NOTE — Progress Notes (Signed)
Pre visit review using our clinic review tool, if applicable. No additional management support is needed unless otherwise documented below in the visit note. 

## 2013-07-20 NOTE — Progress Notes (Signed)
  Subjective:     Kevin Glass is a 80 y.o. male who presents for evaluation of abdominal cramping and abdominal pain. Onset of symptoms was 6 weeks ago. Current symptoms: gerd,  Constipation with alt loose stools has resolved.   Patient denies anorexia, arthralagias, chills, constipation, diarrhea, dysuria, fever, frequency, headache, hematochezia, hematuria, melena, myalgias, nausea, sweats and vomiting. Symptoms have gradually worsened. Previous visits for this problem: none. Evaluation to date has been none. Treatment to date has been none.   Pt stopped stool softner and loose stools resolved.  Pt now only having heartburn -- resolved with tums.  Pt had raw vegetables and pizza yesterday and then he had abd cramping with gas and heartburn.    The following portions of the patient's history were reviewed and updated as appropriate: allergies, current medications, past family history, past medical history, past social history, past surgical history and problem list.  Review of Systems Pertinent items are noted in HPI.   Objective:    BP 120/70  Pulse 102  Temp(Src) 98.2 F (36.8 C) (Oral)  Wt 214 lb (97.07 kg)  SpO2 96% General appearance: alert, cooperative, appears stated age and no distress Lungs: clear to auscultation bilaterally Heart: S1, S2 normal Abdomen: soft, non-tender; bowel sounds normal; no masses,  no organomegaly   Assessment:    gerd, and some Ibs symptoms .    Plan:    Discussed dietary advice. Discussed fiber supplementation. align daily  Check labs nexium daily GI if no relief

## 2013-07-21 LAB — HEPATIC FUNCTION PANEL
ALBUMIN: 4.2 g/dL (ref 3.5–5.2)
ALK PHOS: 55 U/L (ref 39–117)
ALT: 14 U/L (ref 0–53)
AST: 19 U/L (ref 0–37)
BILIRUBIN DIRECT: 0.1 mg/dL (ref 0.0–0.3)
Total Bilirubin: 0.9 mg/dL (ref 0.3–1.2)
Total Protein: 7.2 g/dL (ref 6.0–8.3)

## 2013-07-21 LAB — BASIC METABOLIC PANEL
BUN: 17 mg/dL (ref 6–23)
CO2: 27 mEq/L (ref 19–32)
Calcium: 9.4 mg/dL (ref 8.4–10.5)
Chloride: 107 mEq/L (ref 96–112)
Creatinine, Ser: 0.8 mg/dL (ref 0.4–1.5)
GFR: 94.07 mL/min (ref >60.00)
Glucose, Bld: 84 mg/dL (ref 70–99)
POTASSIUM: 4.4 meq/L (ref 3.5–5.1)
SODIUM: 141 meq/L (ref 135–145)

## 2013-07-21 LAB — TSH: TSH: 2.65 u[IU]/mL (ref 0.35–5.50)

## 2013-08-08 ENCOUNTER — Telehealth: Payer: Self-pay

## 2013-08-08 DIAGNOSIS — K219 Gastro-esophageal reflux disease without esophagitis: Secondary | ICD-10-CM

## 2013-08-08 MED ORDER — OMEPRAZOLE 40 MG PO CPDR: 40.0000 mg | DELAYED_RELEASE_CAPSULE | Freq: Every day | ORAL | Status: DC

## 2013-08-08 NOTE — Telephone Encounter (Signed)
Spoke with patient and he stated that he needed to have Nexium changed to something cheaper. Per Dr.Lowne Omeprazole 40 mg and the patient can discuss something to substitute the align with that is more affordable for him. Patient voiced understanding. Rx sent      KP

## 2013-09-12 ENCOUNTER — Telehealth: Payer: Self-pay

## 2013-09-12 NOTE — Telephone Encounter (Signed)
error 

## 2013-12-19 ENCOUNTER — Telehealth: Payer: Self-pay | Admitting: Family Medicine

## 2013-12-19 DIAGNOSIS — H9193 Unspecified hearing loss, bilateral: Secondary | ICD-10-CM

## 2013-12-19 NOTE — Telephone Encounter (Signed)
Ok to refer to audiology for hearing loss

## 2013-12-19 NOTE — Telephone Encounter (Signed)
Please advise or should we schedule this patient an appointment.    KP

## 2013-12-19 NOTE — Telephone Encounter (Signed)
Caller name: Bannon  Call back [phone]   Reason for call:   pt needs a referral to an audiologist for hearing aids

## 2014-04-05 ENCOUNTER — Ambulatory Visit (INDEPENDENT_AMBULATORY_CARE_PROVIDER_SITE_OTHER): Payer: Medicare Other | Admitting: Medical

## 2014-04-05 VITALS — BP 140/70 | HR 87 | Temp 98.7°F

## 2014-04-05 DIAGNOSIS — E785 Hyperlipidemia, unspecified: Secondary | ICD-10-CM

## 2014-04-05 DIAGNOSIS — Z23 Encounter for immunization: Secondary | ICD-10-CM

## 2014-04-05 DIAGNOSIS — Z0000 Encounter for general adult medical examination without abnormal findings: Secondary | ICD-10-CM

## 2014-04-05 HISTORY — DX: Encounter for general adult medical examination without abnormal findings: Z00.00

## 2014-04-05 NOTE — Progress Notes (Signed)
Pre visit review using our clinic review tool, if applicable. No additional management support is needed unless otherwise documented below in the visit note. 

## 2014-04-05 NOTE — Patient Instructions (Signed)
Preventive Care for Adults A healthy lifestyle and preventive care can promote health and wellness. Preventive health guidelines for men include the following key practices:  A routine yearly physical is a good way to check with your health care provider about your health and preventative screening. It is a chance to share any concerns and updates on your health and to receive a thorough exam.  Visit your dentist for a routine exam and preventative care every 6 months. Brush your teeth twice a day and floss once a day. Good oral hygiene prevents tooth decay and gum disease.  The frequency of eye exams is based on your age, health, family medical history, use of contact lenses, and other factors. Follow your health care provider's recommendations for frequency of eye exams.  Eat a healthy diet. Foods such as vegetables, fruits, whole grains, low-fat dairy products, and lean protein foods contain the nutrients you need without too many calories. Decrease your intake of foods high in solid fats, added sugars, and salt. Eat the right amount of calories for you.Get information about a proper diet from your health care provider, if necessary.  Regular physical exercise is one of the most important things you can do for your health. Most adults should get at least 150 minutes of moderate-intensity exercise (any activity that increases your heart rate and causes you to sweat) each week. In addition, most adults need muscle-strengthening exercises on 2 or more days a week.  Maintain a healthy weight. The body mass index (BMI) is a screening tool to identify possible weight problems. It provides an estimate of body fat based on height and weight. Your health care provider can find your BMI and can help you achieve or maintain a healthy weight.For adults 20 years and older:  A BMI below 18.5 is considered underweight.  A BMI of 18.5 to 24.9 is normal.  A BMI of 25 to 29.9 is considered overweight.  A BMI  of 30 and above is considered obese.  Maintain normal blood lipids and cholesterol levels by exercising and minimizing your intake of saturated fat. Eat a balanced diet with plenty of fruit and vegetables. Blood tests for lipids and cholesterol should begin at age 50 and be repeated every 5 years. If your lipid or cholesterol levels are high, you are over 50, or you are at high risk for heart disease, you may need your cholesterol levels checked more frequently.Ongoing high lipid and cholesterol levels should be treated with medicines if diet and exercise are not working.  If you smoke, find out from your health care provider how to quit. If you do not use tobacco, do not start.  Lung cancer screening is recommended for adults aged 73-80 years who are at high risk for developing lung cancer because of a history of smoking. A yearly low-dose CT scan of the lungs is recommended for people who have at least a 30-pack-year history of smoking and are a current smoker or have quit within the past 15 years. A pack year of smoking is smoking an average of 1 pack of cigarettes a day for 1 year (for example: 1 pack a day for 30 years or 2 packs a day for 15 years). Yearly screening should continue until the smoker has stopped smoking for at least 15 years. Yearly screening should be stopped for people who develop a health problem that would prevent them from having lung cancer treatment.  If you choose to drink alcohol, do not have more than  2 drinks per day. One drink is considered to be 12 ounces (355 mL) of beer, 5 ounces (148 mL) of wine, or 1.5 ounces (44 mL) of liquor.  Avoid use of street drugs. Do not share needles with anyone. Ask for help if you need support or instructions about stopping the use of drugs.  High blood pressure causes heart disease and increases the risk of stroke. Your blood pressure should be checked at least every 1-2 years. Ongoing high blood pressure should be treated with  medicines, if weight loss and exercise are not effective.  If you are 45-79 years old, ask your health care provider if you should take aspirin to prevent heart disease.  Diabetes screening involves taking a blood sample to check your fasting blood sugar level. This should be done once every 3 years, after age 45, if you are within normal weight and without risk factors for diabetes. Testing should be considered at a younger age or be carried out more frequently if you are overweight and have at least 1 risk factor for diabetes.  Colorectal cancer can be detected and often prevented. Most routine colorectal cancer screening begins at the age of 50 and continues through age 75. However, your health care provider may recommend screening at an earlier age if you have risk factors for colon cancer. On a yearly basis, your health care provider may provide home test kits to check for hidden blood in the stool. Use of a small camera at the end of a tube to directly examine the colon (sigmoidoscopy or colonoscopy) can detect the earliest forms of colorectal cancer. Talk to your health care provider about this at age 50, when routine screening begins. Direct exam of the colon should be repeated every 5-10 years through age 75, unless early forms of precancerous polyps or small growths are found.  People who are at an increased risk for hepatitis B should be screened for this virus. You are considered at high risk for hepatitis B if:  You were born in a country where hepatitis B occurs often. Talk with your health care provider about which countries are considered high risk.  Your parents were born in a high-risk country and you have not received a shot to protect against hepatitis B (hepatitis B vaccine).  You have HIV or AIDS.  You use needles to inject street drugs.  You live with, or have sex with, someone who has hepatitis B.  You are a man who has sex with other men (MSM).  You get hemodialysis  treatment.  You take certain medicines for conditions such as cancer, organ transplantation, and autoimmune conditions.  Hepatitis C blood testing is recommended for all people born from 1945 through 1965 and any individual with known risks for hepatitis C.  Practice safe sex. Use condoms and avoid high-risk sexual practices to reduce the spread of sexually transmitted infections (STIs). STIs include gonorrhea, chlamydia, syphilis, trichomonas, herpes, HPV, and human immunodeficiency virus (HIV). Herpes, HIV, and HPV are viral illnesses that have no cure. They can result in disability, cancer, and death.  If you are at risk of being infected with HIV, it is recommended that you take a prescription medicine daily to prevent HIV infection. This is called preexposure prophylaxis (PrEP). You are considered at risk if:  You are a man who has sex with other men (MSM) and have other risk factors.  You are a heterosexual man, are sexually active, and are at increased risk for HIV infection.    You take drugs by injection.  You are sexually active with a partner who has HIV.  Talk with your health care provider about whether you are at high risk of being infected with HIV. If you choose to begin PrEP, you should first be tested for HIV. You should then be tested every 3 months for as long as you are taking PrEP.  A one-time screening for abdominal aortic aneurysm (AAA) and surgical repair of large AAAs by ultrasound are recommended for men ages 32 to 67 years who are current or former smokers.  Healthy men should no longer receive prostate-specific antigen (PSA) blood tests as part of routine cancer screening. Talk with your health care provider about prostate cancer screening.  Testicular cancer screening is not recommended for adult males who have no symptoms. Screening includes self-exam, a health care provider exam, and other screening tests. Consult with your health care provider about any symptoms  you have or any concerns you have about testicular cancer.  Use sunscreen. Apply sunscreen liberally and repeatedly throughout the day. You should seek shade when your shadow is shorter than you. Protect yourself by wearing long sleeves, pants, a wide-brimmed hat, and sunglasses year round, whenever you are outdoors.  Once a month, do a whole-body skin exam, using a mirror to look at the skin on your back. Tell your health care provider about new moles, moles that have irregular borders, moles that are larger than a pencil eraser, or moles that have changed in shape or color.  Stay current with required vaccines (immunizations).  Influenza vaccine. All adults should be immunized every year.  Tetanus, diphtheria, and acellular pertussis (Td, Tdap) vaccine. An adult who has not previously received Tdap or who does not know his vaccine status should receive 1 dose of Tdap. This initial dose should be followed by tetanus and diphtheria toxoids (Td) booster doses every 10 years. Adults with an unknown or incomplete history of completing a 3-dose immunization series with Td-containing vaccines should begin or complete a primary immunization series including a Tdap dose. Adults should receive a Td booster every 10 years.  Varicella vaccine. An adult without evidence of immunity to varicella should receive 2 doses or a second dose if he has previously received 1 dose.  Human papillomavirus (HPV) vaccine. Males aged 68-21 years who have not received the vaccine previously should receive the 3-dose series. Males aged 22-26 years may be immunized. Immunization is recommended through the age of 6 years for any male who has sex with males and did not get any or all doses earlier. Immunization is recommended for any person with an immunocompromised condition through the age of 49 years if he did not get any or all doses earlier. During the 3-dose series, the second dose should be obtained 4-8 weeks after the first  dose. The third dose should be obtained 24 weeks after the first dose and 16 weeks after the second dose.  Zoster vaccine. One dose is recommended for adults aged 50 years or older unless certain conditions are present.  Measles, mumps, and rubella (MMR) vaccine. Adults born before [birth year] generally are considered immune to measles and mumps. Adults born in [birth year] or later should have 1 or more doses of MMR vaccine unless there is a contraindication to the vaccine or there is laboratory evidence of immunity to each of the three diseases. A routine second dose of MMR vaccine should be obtained at least 28 days after the first dose for students attending postsecondary  schools, health care workers, or international travelers. People who received inactivated measles vaccine or an unknown type of measles vaccine during 1963-1967 should receive 2 doses of MMR vaccine. People who received inactivated mumps vaccine or an unknown type of mumps vaccine before 1979 and are at high risk for mumps infection should consider immunization with 2 doses of MMR vaccine. Unvaccinated health care workers born before 1957 who lack laboratory evidence of measles, mumps, or rubella immunity or laboratory confirmation of disease should consider measles and mumps immunization with 2 doses of MMR vaccine or rubella immunization with 1 dose of MMR vaccine.  Pneumococcal 13-valent conjugate (PCV13) vaccine. When indicated, a person who is uncertain of his immunization history and has no record of immunization should receive the PCV13 vaccine. An adult aged 19 years or older who has certain medical conditions and has not been previously immunized should receive 1 dose of PCV13 vaccine. This PCV13 should be followed with a dose of pneumococcal polysaccharide (PPSV23) vaccine. The PPSV23 vaccine dose should be obtained at least 8 weeks after the dose of PCV13 vaccine. An adult aged 19 years or older who has certain medical conditions and  previously received 1 or more doses of PPSV23 vaccine should receive 1 dose of PCV13. The PCV13 vaccine dose should be obtained 1 or more years after the last PPSV23 vaccine dose.  Pneumococcal polysaccharide (PPSV23) vaccine. When PCV13 is also indicated, PCV13 should be obtained first. All adults aged 65 years and older should be immunized. An adult younger than age 65 years who has certain medical conditions should be immunized. Any person who resides in a nursing home or long-term care facility should be immunized. An adult smoker should be immunized. People with an immunocompromised condition and certain other conditions should receive both PCV13 and PPSV23 vaccines. People with human immunodeficiency virus (HIV) infection should be immunized as soon as possible after diagnosis. Immunization during chemotherapy or radiation therapy should be avoided. Routine use of PPSV23 vaccine is not recommended for American Indians, [place] Natives, or people younger than 65 years unless there are medical conditions that require PPSV23 vaccine. When indicated, people who have unknown immunization and have no record of immunization should receive PPSV23 vaccine. One-time revaccination 5 years after the first dose of PPSV23 is recommended for people aged 19-64 years who have chronic kidney failure, nephrotic syndrome, asplenia, or immunocompromised conditions. People who received 1-2 doses of PPSV23 before age 65 years should receive another dose of PPSV23 vaccine at age 65 years or later if at least 5 years have passed since the previous dose. Doses of PPSV23 are not needed for people immunized with PPSV23 at or after age 65 years.  Meningococcal vaccine. Adults with asplenia or persistent complement component deficiencies should receive 2 doses of quadrivalent meningococcal conjugate (MenACWY-D) vaccine. The doses should be obtained at least 2 months apart. Microbiologists working with certain meningococcal bacteria,  military recruits, people at risk during an outbreak, and people who travel to or live in countries with a high rate of meningitis should be immunized. A first-year college student up through age 21 years who is living in a residence hall should receive a dose if he did not receive a dose on or after his 16th birthday. Adults who have certain high-risk conditions should receive one or more doses of vaccine.  Hepatitis A vaccine. Adults who wish to be protected from this disease, have certain high-risk conditions, work with hepatitis A-infected animals, work in hepatitis A research labs, or   travel to or work in countries with a high rate of hepatitis A should be immunized. Adults who were previously unvaccinated and who anticipate close contact with an international adoptee during the first 60 days after arrival in the [place] from a country with a high rate of hepatitis A should be immunized.  Hepatitis B vaccine. Adults should be immunized if they wish to be protected from this disease, have certain high-risk conditions, may be exposed to blood or other infectious body fluids, are household contacts or sex partners of hepatitis B positive people, are clients or workers in certain care facilities, or travel to or work in countries with a high rate of hepatitis B.  Haemophilus influenzae type b (Hib) vaccine. A previously unvaccinated person with asplenia or sickle cell disease or having a scheduled splenectomy should receive 1 dose of Hib vaccine. Regardless of previous immunization, a recipient of a hematopoietic stem cell transplant should receive a 3-dose series 6-12 months after his successful transplant. Hib vaccine is not recommended for adults with HIV infection. Preventive Service / Frequency Ages 52 to 17  Blood pressure check.** / Every 1 to 2 years.  Lipid and cholesterol check.** / Every 5 years beginning at age 69.  Hepatitis C blood test.** / For any individual with known risks for  hepatitis C.  Skin self-exam. / Monthly.  Influenza vaccine. / Every year.  Tetanus, diphtheria, and acellular pertussis (Tdap, Td) vaccine.** / Consult your health care provider. 1 dose of Td every 10 years.  Varicella vaccine.** / Consult your health care provider.  HPV vaccine. / 3 doses over 6 months, if 72 or younger.  Measles, mumps, rubella (MMR) vaccine.** / You need at least 1 dose of MMR if you were born in 1957 or later. You may also need a second dose.  Pneumococcal 13-valent conjugate (PCV13) vaccine.** / Consult your health care provider.  Pneumococcal polysaccharide (PPSV23) vaccine.** / 1 to 2 doses if you smoke cigarettes or if you have certain conditions.  Meningococcal vaccine.** / 1 dose if you are age 35 to 60 years and a Market researcher living in a residence hall, or have one of several medical conditions. You may also need additional booster doses.  Hepatitis A vaccine.** / Consult your health care provider.  Hepatitis B vaccine.** / Consult your health care provider.  Haemophilus influenzae type b (Hib) vaccine.** / Consult your health care provider. Ages 35 to 8  Blood pressure check.** / Every 1 to 2 years.  Lipid and cholesterol check.** / Every 5 years beginning at age 57.  Lung cancer screening. / Every year if you are aged 44-80 years and have a 30-pack-year history of smoking and currently smoke or have quit within the past 15 years. Yearly screening is stopped once you have quit smoking for at least 15 years or develop a health problem that would prevent you from having lung cancer treatment.  Fecal occult blood test (FOBT) of stool. / Every year beginning at age 55 and continuing until age 73. You may not have to do this test if you get a colonoscopy every 10 years.  Flexible sigmoidoscopy** or colonoscopy.** / Every 5 years for a flexible sigmoidoscopy or every 10 years for a colonoscopy beginning at age 28 and continuing until age  1.  Hepatitis C blood test.** / For all people born from [birth year] through 1965 and any individual with known risks for hepatitis C.  Skin self-exam. / Monthly.  Influenza vaccine. / Every  year.  Tetanus, diphtheria, and acellular pertussis (Tdap/Td) vaccine.** / Consult your health care provider. 1 dose of Td every 10 years.  Varicella vaccine.** / Consult your health care provider.  Zoster vaccine.** / 1 dose for adults aged 23 years or older.  Measles, mumps, rubella (MMR) vaccine.** / You need at least 1 dose of MMR if you were born in 1957 or later. You may also need a second dose.  Pneumococcal 13-valent conjugate (PCV13) vaccine.** / Consult your health care provider.  Pneumococcal polysaccharide (PPSV23) vaccine.** / 1 to 2 doses if you smoke cigarettes or if you have certain conditions.  Meningococcal vaccine.** / Consult your health care provider.  Hepatitis A vaccine.** / Consult your health care provider.  Hepatitis B vaccine.** / Consult your health care provider.  Haemophilus influenzae type b (Hib) vaccine.** / Consult your health care provider. Ages 59 and over  Blood pressure check.** / Every 1 to 2 years.  Lipid and cholesterol check.**/ Every 5 years beginning at age 3.  Lung cancer screening. / Every year if you are aged 56-80 years and have a 30-pack-year history of smoking and currently smoke or have quit within the past 15 years. Yearly screening is stopped once you have quit smoking for at least 15 years or develop a health problem that would prevent you from having lung cancer treatment.  Fecal occult blood test (FOBT) of stool. / Every year beginning at age 67 and continuing until age 75. You may not have to do this test if you get a colonoscopy every 10 years.  Flexible sigmoidoscopy** or colonoscopy.** / Every 5 years for a flexible sigmoidoscopy or every 10 years for a colonoscopy beginning at age 46 and continuing until age 77.  Hepatitis C blood  test.** / For all people born from [birth year] through 1965 and any individual with known risks for hepatitis C.  Abdominal aortic aneurysm (AAA) screening.** / A one-time screening for ages 81 to 5 years who are current or former smokers.  Skin self-exam. / Monthly.  Influenza vaccine. / Every year.  Tetanus, diphtheria, and acellular pertussis (Tdap/Td) vaccine.** / 1 dose of Td every 10 years.  Varicella vaccine.** / Consult your health care provider.  Zoster vaccine.** / 1 dose for adults aged 45 years or older.  Pneumococcal 13-valent conjugate (PCV13) vaccine.** / Consult your health care provider.  Pneumococcal polysaccharide (PPSV23) vaccine.** / 1 dose for all adults aged 69 years and older.  Meningococcal vaccine.** / Consult your health care provider.  Hepatitis A vaccine.** / Consult your health care provider.  Hepatitis B vaccine.** / Consult your health care provider.  Haemophilus influenzae type b (Hib) vaccine.** / Consult your health care provider. **Family history and personal history of risk and conditions may change your health care provider's recommendations. Document Released: 07/29/2001 Document Revised: 06/07/2013 Document Reviewed: 10/28/2010 [facility] Patient Information 2015 [place], [place]. This information is not intended to replace advice given to you by your health care provider. Make sure you discuss any questions you have with your health care provider.  I want you to get lipid panel in 1-2 weeks. Other labs were done in feb 2015. You got flu vaccine today.

## 2014-04-05 NOTE — Progress Notes (Signed)
Subjective:    Patient ID: Kevin Glass, male    DOB: 1922-08-26, 80 y.o.   MRN: [id number]  HPI  Pt in for annual wellness exam.  Pt is update to date on tetanus, fluvaccine, zostavax and pnuemovaccine.  He is  up to to date colonosocpy.   Pt last ekg was 2011.  Pt exercises daily walking 3 times. He stretches. Non smoker and no alcohol.  February he had labs done. Mild hx of elevated tryglycerides.  Pt has seen dermatoligist 2 years ago to check rt temporal lesion.Told benign after studies and no changes to area.   Pt states recent bp at his home showed good bp level.      Review of Systems  Constitutional: Negative for fever, chills, diaphoresis and fatigue.  HENT: Negative.   Respiratory: Negative for cough, choking, chest tightness, shortness of breath and wheezing.   Cardiovascular: Negative for chest pain, palpitations and leg swelling.  Genitourinary: Negative.   Musculoskeletal: Negative.   Neurological: Negative for dizziness, tremors, seizures, syncope, facial asymmetry, speech difficulty, weakness, light-headedness, numbness and headaches.  Hematological: Negative.   Psychiatric/Behavioral: Negative.        Objective:   Physical Exam  General Mental Status- Alert. Orientation- Oriented x3.  Build and Nutrition- Well nourished and Well Developed.  Skin General:-Normal. Color- Normal color. Moisture- Normal. Temperature-Warm. Only large dark flat lesion rt temporal area(evaluated by derm told benign)  HEENT  Ears- Normal. Auditory Canal- Bilateral-Normal. Tympanic Membrane- Bilateral-Normal. Eye Fundi-Bilateral-Normal. Pupil- bilateral- Direct reaction to light normal. Nose & Sinuses- Normal. Nostrils-Bilateral- Normal. Mouth & Throat-Normal.  Neck Neck- No Bruits or Masses. Trachea midline.  Thyroid- Normal.  Chest and Lung Exam Percussion: Quality and Intensity-Percussion normal. Percussion of the chest reveals- No Dullness.    Palpation: Palpation of the chest reveals- Non-tender- No dullness. Auscultation: Breath Sounds- Normal.  Adventitous Sounds:-No adventitious sounds.  Cardiovascular Inspection:- No Heaves. Auscultation:-Normal sinus rhythm without murmur gallop, S1 WNL and S2 WNL.  Abdomen Inspection:-Inspection Normal. Inspection of the abdomen reveals- No hernias Palpation/Percussion:- Palpation and Percussion of the Abdomen reveal- Non Tender and No Palpable abdominal masses. Liver: Other Characteristics- No hepatomegaly. Spleen:Other Characteristics- No Splenomegaly. Auscultation:- Auscultation of the abdomen reveals- Bowel sounds normal and No Abdominal bruits.   Neurologic Mental Status:- Normal. Cranial Nerves:-Normal Bilaterally. Motor:-Normal. Strength:5/5 normal muscle strength-All Muscles.  Coordination-Normal. Gait- Normal.  Meningeal Signs- None.  Musculoskeletal Global Assessment General-Joints show full range of motion without obvious deformity and Normal muscle mass. Strength in upper and lower extremities.  Lymphatics General lymphatics Description- No generalized lymphadenopathy.        Assessment & Plan:   Subjective:    Kevin Glass is a 80 y.o. male who presents for Medicare Annual/Subsequent preventive examination.   Preventive Screening-Counseling & Management  Tobacco History  Smoking status  . Former Smoker  . Quit date: 06/17/1983  Smokeless tobacco  . Not on file    Problems Prior to Visit 1.   Current Problems (verified) Patient Active Problem List   Diagnosis Date Noted  . Obesity (BMI 30-39.9) 05/27/2013  . URI (upper respiratory infection) 11/21/2010  . DIZZINESS AND GIDDINESS 12/18/2009  . NONSPECIFIC ABNORMAL ELECTROCARDIOGRAM 06/27/2009  . HEMORRHOIDS 08/26/2007  . DIVERTICULOSIS, COLON 08/26/2007  . ARTHRITIS 08/26/2007  . COLONIC POLYPS 05/04/2007  . IMPAIRED GLUCOSE TOLERANCE 02/23/2007  . HYPRTRPHY PROSTATE BNG W/O  URINARY OBST/LUTS 02/23/2007    Medications Prior to Visit Current Outpatient Prescriptions on File Prior to Visit  Medication  Sig Dispense Refill  . acetaminophen (TYLENOL) 500 MG tablet Take 500 mg by mouth every other day. For pain      . Ascorbic Acid (VITAMIN C) 500 MG tablet Take 1,000 mg by mouth daily.       [name] aspirin 81 MG tablet Take 81 mg by mouth daily.      . calcium carbonate (OS-CAL) 600 MG TABS Take 600 mg by mouth daily.      . Cholecalciferol (VITAMIN D) 2000 UNITS CAPS Take 1 capsule by mouth daily.      Mariane Baumgarten Calcium (STOOL SOFTENER PO) Take 1 tablet by mouth 3 (three) times a week.      . NONFORMULARY OR COMPOUNDED ITEM LS back brace Dx  Back pain  1 each  0  . omeprazole (PRILOSEC) 40 MG capsule Take 1 capsule (40 mg total) by mouth daily.  30 capsule  11  . Saw Palmetto 500 MG CAPS Take 1 capsule by mouth every other day.        No current facility-administered medications on file prior to visit.    Current Medications (verified) Current Outpatient Prescriptions  Medication Sig Dispense Refill  . acetaminophen (TYLENOL) 500 MG tablet Take 500 mg by mouth every other day. For pain      . Ascorbic Acid (VITAMIN C) 500 MG tablet Take 1,000 mg by mouth daily.       [name] aspirin 81 MG tablet Take 81 mg by mouth daily.      . calcium carbonate (OS-CAL) 600 MG TABS Take 600 mg by mouth daily.      . Cholecalciferol (VITAMIN D) 2000 UNITS CAPS Take 1 capsule by mouth daily.      Mariane Baumgarten Calcium (STOOL SOFTENER PO) Take 1 tablet by mouth 3 (three) times a week.      . NONFORMULARY OR COMPOUNDED ITEM LS back brace Dx  Back pain  1 each  0  . omeprazole (PRILOSEC) 40 MG capsule Take 1 capsule (40 mg total) by mouth daily.  30 capsule  11  . Saw Palmetto 500 MG CAPS Take 1 capsule by mouth every other day.        No current facility-administered medications for this visit.     Allergies (verified) Penicillins   PAST HISTORY  Family History Family History   Problem Relation Age of Onset  . Cancer Mother   . Arthritis Sister     Social History History  Substance Use Topics  . Smoking status: Former Smoker    Quit date: 06/17/1983  . Smokeless tobacco: Not on file  . Alcohol Use: No    Are there smokers in your home (other than you)?  No  Risk Factors Current exercise habits: walks his dog 3 times a day.  Dietary issues discussed: none   Cardiac risk factors: none other than age and elevated tryglycerides.  Depression Screen (Note: if answer to either of the following is "Yes", a more complete depression screening is indicated)   Q1: Over the past two weeks, have you felt down, depressed or hopeless? No  Q2: Over the past two weeks, have you felt little interest or pleasure in doing things? No  Have you lost interest or pleasure in daily life? No  Do you often feel hopeless? No  Do you cry easily over simple problems? No  Activities of Daily Living In your present state of health, do you have any difficulty performing the following activities?:  Driving? No Managing money?  No Feeding yourself? No Getting from bed to chair? No Climbing a flight of stairs? No Preparing food and eating?: No Bathing or showering? No Getting dressed: No Getting to the toilet? No Using the toilet:No Moving around from place to place: No In the past year have you fallen or had a near fall?:No   Are you sexually active?  Yes. But rare  Do you have more than one partner?  No  Hearing Difficulties: Yes Do you often ask people to speak up or repeat themselves? Yes Do you experience ringing or noises in your ears? No Do you have difficulty understanding soft or whispered voices? Yes   Do you feel that you have a problem with memory? No  Do you often misplace items? No  Do you feel safe at home?  Yes  Cognitive Testing  Alert? Yes  Normal Appearance?Yes  Oriented to person? Yes  Place? Yes   Time? Yes  Recall of three objects?  Yes  Can  perform simple calculations? Yes  Displays appropriate judgment?Yes  Can read the correct time from a watch face?Yes   Advanced Directives have been discussed with the patient? Yes   List the Names of Other Physician/Practitioners you currently use: 1.  None other than audiologist and dermatologist.  Indicate any recent Medical Services you may have received from other than [facility] providers in the past year (date may be approximate).  Immunization History  Administered Date(s) Administered  . Influenza,inj,Quad PF,36+ Mos 05/27/2013, 04/05/2014  . Pneumococcal Polysaccharide-23 01/18/2008  . Td 07/31/2004  . Tdap 07/31/2004  . Zoster 06/25/2005    Screening Tests Health Maintenance  Topic Date Due  . Influenza Vaccine  01/14/2014  . Tetanus/tdap  07/31/2014  . Colonoscopy  04/25/2017  . Pneumococcal Polysaccharide Vaccine Age 98 And Over  Completed  . Zostavax  Completed    All answers were reviewed with the patient and necessary referrals were made:  Mackie Pai, PA-C   04/05/2014   History reviewed:  He  has a past medical history of Colonic polyp; Arthritis; Hemorrhoid; and BPH (benign prostatic hypertrophy). He  does not have any pertinent problems on file. He  has past surgical history that includes Total knee arthroplasty (2003) and Hemorrhoid surgery. His family history includes Arthritis in his sister; Cancer in his mother. He  reports that he quit smoking about 30 years ago. He does not have any smokeless tobacco history on file. He reports that he does not drink alcohol or use illicit drugs. He has a current medication list which includes the following prescription(s): acetaminophen, vitamin c, aspirin, calcium carbonate, vitamin d, docusate calcium, NONFORMULARY OR COMPOUNDED ITEM, omeprazole, and saw palmetto. Current Outpatient Prescriptions on File Prior to Visit  Medication Sig Dispense Refill  . acetaminophen (TYLENOL) 500 MG tablet Take 500 mg by mouth  every other day. For pain      . Ascorbic Acid (VITAMIN C) 500 MG tablet Take 1,000 mg by mouth daily.       [name] aspirin 81 MG tablet Take 81 mg by mouth daily.      . calcium carbonate (OS-CAL) 600 MG TABS Take 600 mg by mouth daily.      . Cholecalciferol (VITAMIN D) 2000 UNITS CAPS Take 1 capsule by mouth daily.      Mariane Baumgarten Calcium (STOOL SOFTENER PO) Take 1 tablet by mouth 3 (three) times a week.      . NONFORMULARY OR COMPOUNDED ITEM LS back brace Dx  Back pain  1 each  0  . omeprazole (PRILOSEC) 40 MG capsule Take 1 capsule (40 mg total) by mouth daily.  30 capsule  11  . Saw Palmetto 500 MG CAPS Take 1 capsule by mouth every other day.        No current facility-administered medications on file prior to visit.   He is allergic to penicillins.  Review of Systems A comprehensive review of systems was negative.  SEE above   Objective:    Vision by Snellen chart: right CYE:LYHTMBP declines measurement, left JPE:TKKOECX declines measurement Blood pressure 140/70, pulse 87, temperature 98.7 F (37.1 C), SpO2 96.00%. There is no weight on file to calculate BMI.  See PE above.    Assessment:    Patient presents for yearly preventative medicine examination. Medicare questionnaire was completed  All immunizations and health maintenance protocols were reviewed with the patient and needed orders were placed.  Appropriate screening laboratory values were ordered for the patient including screening of hyperlipidemia, renal function and hepatic function. If indicated by BPH, a PSA was ordered.  Medication reconciliation,  past medical history, social history, problem list and allergies were reviewed in detail with the patient  Goals were established with regard to weight loss, exercise, and  diet in compliance with medications  End of life planning was discussed.     Plan:    During the course of the visit the patient was educated and counseled about appropriate screening  and preventive services including:    appears pt awv are met. pt is working on getting hearin aids though [place].  Diet review for nutrition referral? Yes ____  Not Indicated ____   Patient Instructions (the written plan) was given to the patient.  Medicare Attestation I have personally reviewed: The patient's medical and social history Their use of alcohol, tobacco or illicit drugs Their current medications and supplements The patient's functional ability including ADLs,fall risks, home safety risks, cognitive, and hearing and visual impairment Diet and physical activities Evidence for depression or mood disorders  The patient's weight, height, BMI, and visual acuity have been recorded in the chart.  I have made referrals, counseling, and provided education to the patient based on review of the above and I have provided the patient with a written personalized care plan for preventive services.     Mackie Pai, PA-C   04/05/2014

## 2014-04-11 ENCOUNTER — Other Ambulatory Visit (INDEPENDENT_AMBULATORY_CARE_PROVIDER_SITE_OTHER): Payer: Medicare Other

## 2014-04-11 DIAGNOSIS — E785 Hyperlipidemia, unspecified: Secondary | ICD-10-CM

## 2014-04-11 LAB — LIPID PANEL
Cholesterol: 175 mg/dL (ref 0–200)
HDL: 33.8 mg/dL — ABNORMAL LOW (ref >39.00)
LDL Cholesterol: 117 mg/dL — ABNORMAL HIGH (ref 0–99)
NonHDL: 141.2
TRIGLYCERIDES: 119 mg/dL (ref 0.0–149.0)
Total CHOL/HDL Ratio: 5
VLDL: 23.8 mg/dL (ref 0.0–40.0)

## 2014-04-13 ENCOUNTER — Ambulatory Visit (INDEPENDENT_AMBULATORY_CARE_PROVIDER_SITE_OTHER): Payer: Medicare Other | Admitting: Medical

## 2014-04-13 ENCOUNTER — Encounter: Payer: Self-pay | Admitting: Medical

## 2014-04-13 VITALS — BP 128/79 | HR 94 | Temp 98.2°F | Ht 69.0 in | Wt 209.6 lb

## 2014-04-13 DIAGNOSIS — A084 Viral intestinal infection, unspecified: Secondary | ICD-10-CM | POA: Insufficient documentation

## 2014-04-13 DIAGNOSIS — R197 Diarrhea, unspecified: Secondary | ICD-10-CM

## 2014-04-13 MED ORDER — ONDANSETRON 8 MG PO TBDP: 8.0000 mg | ORAL_TABLET | Freq: Three times a day (TID) | ORAL | Status: DC | PRN

## 2014-04-13 MED ORDER — CIPROFLOXACIN HCL 500 MG PO TABS: 500.0000 mg | ORAL_TABLET | Freq: Two times a day (BID) | ORAL | Status: DC

## 2014-04-13 MED ORDER — DIPHENOXYLATE-ATROPINE 2.5-0.025 MG PO TABS: 1.0000 | ORAL_TABLET | Freq: Four times a day (QID) | ORAL | Status: DC | PRN

## 2014-04-13 NOTE — Assessment & Plan Note (Signed)
Likely viral. Rx lomotil and zofran. Recommended bland diet. Hydrate with propel. Turn in stool panel. If diarrhea worsens as discussed pending stool panel then start cipro. See avs  as well.

## 2014-04-13 NOTE — Progress Notes (Signed)
Subjective:    Patient ID: Kevin Glass, male    DOB: September 16, 1922, 80 y.o.   MRN: [id number]  HPI  Pt in with recent diarrhea that started last Saturday. Pt tried some kaopectate and did help decrease diarrhea some. But then yesterday morning he passed a small hard stool then had a lot of loose watery stool again. No history of abdominal surgeries. Pt states 3-4 loose stools yesterday and 3 already today. Daughter in law and son had similar symptoms but they are better after 2 weeks. No nausea or vomiting. No blood in stools.   Pt had 2 sausage sandwiches Saturday morning and then felt little bloated before diarrhea started.  Past Medical History  Diagnosis Date  . Colonic polyp   . Arthritis   . Hemorrhoid   . BPH (benign prostatic hypertrophy)     History   Social History  . Marital Status: Married    Spouse Name: N/A    Number of Children: N/A  . Years of Education: N/A   Occupational History  . retired    Social History Main Topics  . Smoking status: Former Smoker    Quit date: 06/17/1983  . Smokeless tobacco: Not on file  . Alcohol Use: No  . Drug Use: No  . Sexual Activity: Yes    Partners: Female   Other Topics Concern  . Not on file   Social History Narrative  . No narrative on file    Past Surgical History  Procedure Laterality Date  . Total knee arthroplasty  2003    Dr Maureen Ralphs  . Hemorrhoid surgery      Family History  Problem Relation Age of Onset  . Cancer Mother   . Arthritis Sister     Allergies  Allergen Reactions  . Penicillins     White blister     Current Outpatient Prescriptions on File Prior to Visit  Medication Sig Dispense Refill  . acetaminophen (TYLENOL) 500 MG tablet Take 500 mg by mouth every other day. For pain      . Ascorbic Acid (VITAMIN C) 500 MG tablet Take 1,000 mg by mouth daily.       [name] aspirin 81 MG tablet Take 81 mg by mouth daily.      . calcium carbonate (OS-CAL) 600 MG TABS Take 600 mg by mouth daily.       . Cholecalciferol (VITAMIN D) 2000 UNITS CAPS Take 1 capsule by mouth daily.      Mariane Baumgarten Calcium (STOOL SOFTENER PO) Take 1 tablet by mouth 3 (three) times a week.      . NONFORMULARY OR COMPOUNDED ITEM LS back brace Dx  Back pain  1 each  0  . omeprazole (PRILOSEC) 40 MG capsule Take 1 capsule (40 mg total) by mouth daily.  30 capsule  11  . Saw Palmetto 500 MG CAPS Take 1 capsule by mouth every other day.        No current facility-administered medications on file prior to visit.    BP 128/79  Pulse 94  Temp(Src) 98.2 F (36.8 C) (Oral)  Ht 5\' 9"  (1.753 m)  Wt 209 lb 9.6 oz (95.074 kg)  BMI 30.94 kg/m2  SpO2 96%      Review of Systems  Constitutional: Negative for fever, chills and fatigue.  HENT: Negative.   Respiratory: Negative for cough, choking, shortness of breath and wheezing.   Cardiovascular: Negative for chest pain and palpitations.  Gastrointestinal: Positive for abdominal pain  and diarrhea. Negative for nausea, vomiting, constipation, blood in stool, abdominal distention, anal bleeding and rectal pain.       Minimal intermittent when he has diarrhea.  Genitourinary: Negative.   Musculoskeletal: Negative for back pain.  Skin: Negative for color change, pallor, rash and wound.  Neurological: Negative.   Hematological: Negative for adenopathy. Does not bruise/bleed easily.          Objective:   Physical Exam  .General Appearance- Not in acute distress.  HEENT Eyes- Scleraeral/Conjuntiva-bilat- Not Yellow. Mouth & Throat- Normal.  Chest and Lung Exam Auscultation: Breath sounds:-Normal. CTA Adventitious sounds:- No Adventitious sounds.  Cardiovascular Auscultation:Rythm - Regula, Rate and Rythm. Heart Sounds -Normal heart sounds.  Abdomen Inspection:-Inspection Normal.  Palpation/Perucssion: Palpation and Percussion of the abdomen reveal- Non Tender, No Rebound tenderness, No rigidity(Guarding) and No Palpable abdominal masses.    Liver:-Normal.  Spleen:- Normal.   Skin- moist with no tenting.        Assessment & Plan:

## 2014-04-13 NOTE — Progress Notes (Signed)
Pre visit review using our clinic review tool, if applicable. No additional management support is needed unless otherwise documented below in the visit note. 

## 2014-04-13 NOTE — Patient Instructions (Signed)
For your diarrhea(which is likely form viral gastroenteritis), I will prescribe lomotil. If you get any nausea or vomiting I am prescribing zofran.  I do want you to follow bland diet guidelines and hydrate with propel fitness water.  If your diarrhea worsens(such as loose stools exceeding 8 times day) over the weekend then start cipro antibiotic.  Turn in stool panel studies asap. Today if possible but not later than tomorrow.  Follow up in 7 days or prn  Also in event any severe abdominal pain be seen in the ED.

## 2014-04-15 LAB — CLOSTRIDIUM DIFFICILE BY PCR: Toxigenic C. Difficile by PCR: NOT DETECTED

## 2014-07-04 ENCOUNTER — Ambulatory Visit (INDEPENDENT_AMBULATORY_CARE_PROVIDER_SITE_OTHER): Payer: Medicare Other | Admitting: Family Medicine

## 2014-07-04 ENCOUNTER — Encounter: Payer: Self-pay | Admitting: Family Medicine

## 2014-07-04 VITALS — BP 136/82 | HR 73 | Temp 97.9°F | Wt 212.0 lb

## 2014-07-04 DIAGNOSIS — D229 Melanocytic nevi, unspecified: Secondary | ICD-10-CM | POA: Diagnosis not present

## 2014-07-04 DIAGNOSIS — J302 Other seasonal allergic rhinitis: Secondary | ICD-10-CM

## 2014-07-04 MED ORDER — FLUTICASONE PROPIONATE 50 MCG/ACT NA SUSP: 2.0000 | Freq: Every day | NASAL | Status: DC

## 2014-07-04 MED ORDER — LEVOCETIRIZINE DIHYDROCHLORIDE 5 MG PO TABS: 5.0000 mg | ORAL_TABLET | Freq: Every evening | ORAL | Status: DC

## 2014-07-04 NOTE — Patient Instructions (Signed)

## 2014-07-04 NOTE — Progress Notes (Signed)
Pre visit review using our clinic review tool, if applicable. No additional management support is needed unless otherwise documented below in the visit note. 

## 2014-07-05 ENCOUNTER — Encounter: Payer: Self-pay | Admitting: Family Medicine

## 2014-07-05 NOTE — Progress Notes (Signed)
  Subjective:     Kevin Glass is a 81 y.o. male who presents for evaluation and treatment of allergic symptoms. Symptoms include: clear rhinorrhea, cough, nasal congestion, postnasal drip and watery eyes and are present in a seasonal pattern. Precipitants include: unknown. Treatment currently includes oral antihistamines: zyrtec and is not effective.-- it started out working and then stopped.  The following portions of the patient's history were reviewed and updated as appropriate:  He  has a past medical history of Colonic polyp; Arthritis; Hemorrhoid; and BPH (benign prostatic hypertrophy). He  does not have any pertinent problems on file. He  has past surgical history that includes Total knee arthroplasty (2003) and Hemorrhoid surgery. His family history includes Arthritis in his sister; Cancer in his mother. He  reports that he quit smoking about 31 years ago. He does not have any smokeless tobacco history on file. He reports that he does not drink alcohol or use illicit drugs. He has a current medication list which includes the following prescription(s): acetaminophen, vitamin c, aspirin, calcium carbonate, vitamin d, omeprazole, saw palmetto, fluticasone, and levocetirizine. Current Outpatient Prescriptions on File Prior to Visit  Medication Sig Dispense Refill  . acetaminophen (TYLENOL) 500 MG tablet Take 500 mg by mouth every other day. For pain    . Ascorbic Acid (VITAMIN C) 500 MG tablet Take 1,000 mg by mouth daily.     [name] aspirin 81 MG tablet Take 81 mg by mouth daily.    . calcium carbonate (OS-CAL) 600 MG TABS Take 600 mg by mouth daily.    . Cholecalciferol (VITAMIN D) 2000 UNITS CAPS Take 1 capsule by mouth daily.    [name] omeprazole (PRILOSEC) 40 MG capsule Take 1 capsule (40 mg total) by mouth daily. 30 capsule 11  . Saw Palmetto 500 MG CAPS Take 1 capsule by mouth every other day.      No current facility-administered medications on file prior to visit.   He is allergic to  penicillins..  Review of Systems Pertinent items are noted in HPI.    Objective:    BP 136/82 mmHg  Pulse 73  Temp(Src) 97.9 F (36.6 C) (Oral)  Wt 212 lb (96.163 kg)  SpO2 97% General appearance: alert, cooperative, appears stated age and no distress Ears: normal TM's and external ear canals both ears Nose: clear discharge, moderate congestion, no sinus tenderness Throat: lips, mucosa, and tongue normal; teeth and gums normal Neck: no adenopathy, supple, symmetrical, trachea midline and thyroid not enlarged, symmetric, no tenderness/mass/nodules Lungs: clear to auscultation bilaterally Heart: S1, S2 normal    Assessment:    Allergic rhinitis.    Plan:    Medications: intranasal steroids: flonase, oral antihistamines: xyzal. Allergen avoidance discussed. Follow-up in a few days-- if no better.

## 2014-07-11 ENCOUNTER — Ambulatory Visit (INDEPENDENT_AMBULATORY_CARE_PROVIDER_SITE_OTHER): Payer: Medicare Other | Admitting: Family Medicine

## 2014-07-11 ENCOUNTER — Encounter: Payer: Self-pay | Admitting: Family Medicine

## 2014-07-11 VITALS — BP 110/74 | HR 93 | Temp 97.9°F | Wt 211.8 lb

## 2014-07-11 DIAGNOSIS — J208 Acute bronchitis due to other specified organisms: Secondary | ICD-10-CM | POA: Diagnosis not present

## 2014-07-11 DIAGNOSIS — J0110 Acute frontal sinusitis, unspecified: Secondary | ICD-10-CM | POA: Diagnosis not present

## 2014-07-11 MED ORDER — CLARITHROMYCIN ER 500 MG PO TB24: 1000.0000 mg | ORAL_TABLET | Freq: Every day | ORAL | Status: AC

## 2014-07-11 NOTE — Progress Notes (Signed)
Pre visit review using our clinic review tool, if applicable. No additional management support is needed unless otherwise documented below in the visit note. 

## 2014-07-11 NOTE — Patient Instructions (Signed)

## 2014-07-11 NOTE — Progress Notes (Signed)
  Subjective:     Kevin Glass is a 81 y.o. male who presents for evaluation of symptoms of a URI. Symptoms include congestion, nasal congestion, no  fever, post nasal drip, purulent nasal discharge and sinus pressure. Onset of symptoms was several weeks ago, and has been gradually worsening since that time. Treatment to date: antihistamines and nasal steroids.  The following portions of the patient's history were reviewed and updated as appropriate: allergies, current medications, past family history, past medical history, past social history, past surgical history and problem list.  Review of Systems Pertinent items are noted in HPI.   Objective:    BP 110/74 mmHg  Pulse 93  Temp(Src) 97.9 F (36.6 C) (Oral)  Wt 211 lb 12.8 oz (96.072 kg)  SpO2 95% General appearance: alert, cooperative, appears stated age and no distress Ears: normal TM's and external ear canals both ears Nose: blood tinged and green discharge, moderate congestion, turbinates red, swollen, sinus tenderness bilateral Throat: abnormal findings: mild oropharyngeal erythema Neck: mild anterior cervical adenopathy, supple, symmetrical, trachea midline and thyroid not enlarged, symmetric, no tenderness/mass/nodules Lungs: wheezes bilaterally Heart: S1, S2 normal Extremities: extremities normal, atraumatic, no cyanosis or edema   Assessment:    bronchitis and sinusitis   Plan:    Discussed the diagnosis and treatment of sinusitis. Suggested symptomatic OTC remedies. Nasal saline spray for congestion. biaxin per orders. Nasal steroids per orders. Follow up as needed.   If no better by Friday --- check cxr

## 2014-07-13 DIAGNOSIS — L821 Other seborrheic keratosis: Secondary | ICD-10-CM | POA: Diagnosis not present

## 2014-07-13 DIAGNOSIS — L820 Inflamed seborrheic keratosis: Secondary | ICD-10-CM | POA: Diagnosis not present

## 2014-08-14 ENCOUNTER — Ambulatory Visit: Payer: Medicare Other | Admitting: Family Medicine

## 2014-09-18 ENCOUNTER — Ambulatory Visit (INDEPENDENT_AMBULATORY_CARE_PROVIDER_SITE_OTHER): Payer: Medicare Other | Admitting: Family Medicine

## 2014-09-18 ENCOUNTER — Ambulatory Visit (HOSPITAL_BASED_OUTPATIENT_CLINIC_OR_DEPARTMENT_OTHER)
Admission: RE | Admit: 2014-09-18 | Discharge: 2014-09-18 | Disposition: A | Discharge status: Home / Self Care | Payer: Medicare Other | Source: Ambulatory Visit | Attending: Family Medicine | Admitting: Family Medicine

## 2014-09-18 ENCOUNTER — Encounter: Payer: Self-pay | Admitting: Family Medicine

## 2014-09-18 VITALS — BP 128/86 | HR 87 | Temp 98.4°F | Wt 203.6 lb

## 2014-09-18 DIAGNOSIS — J302 Other seasonal allergic rhinitis: Secondary | ICD-10-CM | POA: Diagnosis not present

## 2014-09-18 DIAGNOSIS — M25561 Pain in right knee: Secondary | ICD-10-CM

## 2014-09-18 DIAGNOSIS — K5901 Slow transit constipation: Secondary | ICD-10-CM

## 2014-09-18 DIAGNOSIS — K5900 Constipation, unspecified: Secondary | ICD-10-CM | POA: Insufficient documentation

## 2014-09-18 DIAGNOSIS — K5909 Other constipation: Secondary | ICD-10-CM | POA: Diagnosis not present

## 2014-09-18 DIAGNOSIS — M1711 Unilateral primary osteoarthritis, right knee: Secondary | ICD-10-CM | POA: Diagnosis not present

## 2014-09-18 HISTORY — DX: Constipation, unspecified: K59.00

## 2014-09-18 MED ORDER — DESLORATADINE 5 MG PO TABS: 5.0000 mg | ORAL_TABLET | Freq: Every day | ORAL | Status: DC

## 2014-09-18 NOTE — Progress Notes (Signed)
Patient ID: Kevin Glass, male    DOB: Aug 02, 1922  Age: 81 y.o. MRN: [id number]    Subjective:  Subjective HPI Kevin Glass presents c/o constipation.  He thinks is started when he started the xyzal.   Pt also c/o knee pain and is requesting an injection.  Review of Systems  Constitutional: Negative for unexpected weight change.  Respiratory: Negative for cough and shortness of breath.   Cardiovascular: Negative for chest pain and palpitations.  Musculoskeletal: Positive for joint swelling and arthralgias.  Psychiatric/Behavioral: Negative for dysphoric mood and agitation. The patient is not nervous/anxious.     History Past Medical History  Diagnosis Date  . Colonic polyp   . Arthritis   . Hemorrhoid   . BPH (benign prostatic hypertrophy)     He has past surgical history that includes Total knee arthroplasty (2003) and Hemorrhoid surgery.   His family history includes Arthritis in his sister; Cancer in his mother.He reports that he quit smoking about 31 years ago. He does not have any smokeless tobacco history on file. He reports that he does not drink alcohol or use illicit drugs.  Current Outpatient Prescriptions on File Prior to Visit  Medication Sig Dispense Refill  . acetaminophen (TYLENOL) 500 MG tablet Take 500 mg by mouth every other day. For pain    . Ascorbic Acid (VITAMIN C) 500 MG tablet Take 1,000 mg by mouth daily.     [name] aspirin 81 MG tablet Take 81 mg by mouth daily.    . calcium carbonate (OS-CAL) 600 MG TABS Take 600 mg by mouth daily.    . Cholecalciferol (VITAMIN D) 2000 UNITS CAPS Take 1 capsule by mouth daily.    . fluticasone (FLONASE) 50 MCG/ACT nasal spray Place 2 sprays into both nostrils daily. 16 g 6  . levocetirizine (XYZAL) 5 MG tablet Take 1 tablet (5 mg total) by mouth every evening. 30 tablet 11  . omeprazole (PRILOSEC) 40 MG capsule Take 1 capsule (40 mg total) by mouth daily. 30 capsule 11  . Saw Palmetto 500 MG CAPS Take 1 capsule by  mouth every other day.      No current facility-administered medications on file prior to visit.     Objective:  Objective Physical Exam  Constitutional: He appears well-developed and well-nourished. No distress.  Cardiovascular: Normal rate, regular rhythm and normal heart sounds.   Pulmonary/Chest: Effort normal and breath sounds normal. No respiratory distress.  Psychiatric: He has a normal mood and affect. His behavior is normal. Judgment and thought content normal.   BP 128/86 mmHg  Pulse 87  Temp(Src) 98.4 F (36.9 C) (Oral)  Wt 203 lb 9.6 oz (92.352 kg)  SpO2 95% Wt Readings from Last 3 Encounters:  09/18/14 203 lb 9.6 oz (92.352 kg)  07/11/14 211 lb 12.8 oz (96.072 kg)  07/04/14 212 lb (96.163 kg)     Lab Results  Component Value Date   WBC 6.9 07/20/2013   HGB 15.6 07/20/2013   HCT 46.2 07/20/2013   PLT 178.0 07/20/2013   GLUCOSE 84 07/20/2013   CHOL 175 04/11/2014   TRIG 119.0 04/11/2014   HDL 33.80* 04/11/2014   LDLCALC 117* 04/11/2014   ALT 14 07/20/2013   AST 19 07/20/2013   NA 141 07/20/2013   K 4.4 07/20/2013   CL 107 07/20/2013   CREATININE 0.8 07/20/2013   BUN 17 07/20/2013   CO2 27 07/20/2013   TSH 2.65 07/20/2013   PSA 1.45 05/27/2013   HGBA1C 5.8  06/29/2009    Dg Bone Density  12/12/2011   Lowest t-score -1.0, c/w osteopenia; consider continuation of calcium,  adding Vit D supplementation    Assessment & Plan:  Plan I am having Mr. Wenke start on desloratadine. I am also having him maintain his vitamin C, Saw Palmetto, acetaminophen, calcium carbonate, Vitamin D, aspirin, omeprazole, levocetirizine, and fluticasone.  Meds ordered this encounter  Medications  . desloratadine (CLARINEX) 5 MG tablet    Sig: Take 1 tablet (5 mg total) by mouth daily.    Dispense:  30 tablet    Refill:  11    Problem List Items Addressed This Visit    Constipation    Inc water Stop xyzal and calcium for now May try clarinex if constipation improves  after stopping .       Other Visit Diagnoses    Seasonal allergies    -  Primary    Relevant Medications    desloratadine (CLARINEX) 5 MG tablet    Other constipation        Recurrent knee pain, right        Relevant Orders    Ambulatory referral to Sports Medicine    DG Knee Complete 4 Views Right (Completed)       Follow-up: Return in about 6 months (around 03/20/2015), or if symptoms worsen or fail to improve.  Kevin Koyanagi, DO

## 2014-09-18 NOTE — Patient Instructions (Signed)

## 2014-09-18 NOTE — Assessment & Plan Note (Signed)
Inc water Stop xyzal and calcium for now May try clarinex if constipation improves after stopping .

## 2014-09-18 NOTE — Progress Notes (Signed)
Pre visit review using our clinic review tool, if applicable. No additional management support is needed unless otherwise documented below in the visit note. 

## 2014-09-19 ENCOUNTER — Ambulatory Visit (INDEPENDENT_AMBULATORY_CARE_PROVIDER_SITE_OTHER): Payer: Medicare Other | Admitting: Family Medicine

## 2014-09-19 ENCOUNTER — Encounter: Payer: Self-pay | Admitting: Family Medicine

## 2014-09-19 VITALS — BP 132/83 | HR 90 | Ht 72.0 in | Wt 204.0 lb

## 2014-09-19 DIAGNOSIS — M25561 Pain in right knee: Secondary | ICD-10-CM

## 2014-09-19 MED ORDER — METHYLPREDNISOLONE ACETATE 40 MG/ML IJ SUSP
40.0000 mg | Freq: Once | INTRAMUSCULAR | Status: AC
  Administered 2014-09-19: 40 mg via INTRA_ARTICULAR

## 2014-09-19 NOTE — Patient Instructions (Signed)
Your knee pain is due to arthritis. These are the different medicines you can try for this: Take tylenol 500mg  1-2 tabs three times a day for pain. Glucosamine sulfate 750mg  twice a day is a supplement that may help. Capsaicin topically up to four times a day may also help with pain. Cortisone injections are an option - you were given one of these today. If cortisone injections do not help, there are different types of shots that may help but they take longer to take effect. It's important that you continue to stay active. Straight leg raises, knee extensions 3 sets of 10 once a day (add ankle weight if these become too easy). Consider physical therapy to strengthen muscles around the joint that hurts to take pressure off of the joint itself. Shoe inserts with good arch support may be helpful. Walker or cane if needed. Heat or ice 15 minutes at a time 3-4 times a day as needed to help with pain. Water aerobics and cycling with low resistance are the best two types of exercise for arthritis. Follow up with me in 1 month or as needed.

## 2014-09-21 DIAGNOSIS — M25561 Pain in right knee: Secondary | ICD-10-CM

## 2014-09-21 HISTORY — DX: Pain in right knee: M25.561

## 2014-09-21 NOTE — Assessment & Plan Note (Signed)
2/2 arthritis.  Given intraarticular cortisone injection today.  Discussed tylenol, glucosamine, capsaicin. Advised avoiding nsaids.  Shown home exercises to do daily.  Consider PT, inserts with good arch support, heat/ice.  F/u in 1 month or prn.  After informed written consent, patient was seated on exam table. Right knee was prepped with alcohol swab and utilizing anterolateral approach, patient's right knee was injected intraarticularly with 3:1 marcaine: depomedrol. Patient tolerated the procedure well without immediate complications.

## 2014-09-21 NOTE — Progress Notes (Signed)
PCP: Garnet Koyanagi, DO  Subjective:   HPI: Patient is a 81 y.o. male here for right knee pain.  Patient denies known injury. States for the past month has had worsening pain (especially past 2 weeks) medial aspect of right knee. Feels pain going down into his shin. Slight swelling. Would like a cortisone injection. Tried tylenol.   History of left knee replacement by Dr. Maureen Ralphs.  Past Medical History  Diagnosis Date  . Colonic polyp   . Arthritis   . Hemorrhoid   . BPH (benign prostatic hypertrophy)     Current Outpatient Prescriptions on File Prior to Visit  Medication Sig Dispense Refill  . acetaminophen (TYLENOL) 500 MG tablet Take 500 mg by mouth every other day. For pain    . Ascorbic Acid (VITAMIN C) 500 MG tablet Take 1,000 mg by mouth daily.     [name] aspirin 81 MG tablet Take 81 mg by mouth daily.    . calcium carbonate (OS-CAL) 600 MG TABS Take 600 mg by mouth daily.    . Cholecalciferol (VITAMIN D) 2000 UNITS CAPS Take 1 capsule by mouth daily.    [name] desloratadine (CLARINEX) 5 MG tablet Take 1 tablet (5 mg total) by mouth daily. 30 tablet 11  . fluticasone (FLONASE) 50 MCG/ACT nasal spray Place 2 sprays into both nostrils daily. 16 g 6  . levocetirizine (XYZAL) 5 MG tablet Take 1 tablet (5 mg total) by mouth every evening. 30 tablet 11  . omeprazole (PRILOSEC) 40 MG capsule Take 1 capsule (40 mg total) by mouth daily. 30 capsule 11  . Saw Palmetto 500 MG CAPS Take 1 capsule by mouth every other day.      No current facility-administered medications on file prior to visit.    Past Surgical History  Procedure Laterality Date  . Total knee arthroplasty  2003    Dr Maureen Ralphs  . Hemorrhoid surgery      Allergies  Allergen Reactions  . Penicillins Rash    White blister on the skin     History   Social History  . Marital Status: Married    Spouse Name: N/A  . Number of Children: N/A  . Years of Education: N/A   Occupational History  . retired    Social  History Main Topics  . Smoking status: Former Smoker    Quit date: 06/17/1983  . Smokeless tobacco: Not on file  . Alcohol Use: No  . Drug Use: No  . Sexual Activity:    Partners: Female   Other Topics Concern  . Not on file   Social History Narrative    Family History  Problem Relation Age of Onset  . Cancer Mother   . Arthritis Sister     BP 132/83 mmHg  Pulse 90  Ht 6' (1.829 m)  Wt 204 lb (92.534 kg)  BMI 27.66 kg/m2  Review of Systems: See HPI above.    Objective:  Physical Exam:  Gen: NAD  Right knee: No gross deformity, ecchymoses, swelling.  2+ crepitation. Mild medial tenderness. FROM. Negative ant/post drawers. Negative valgus/varus testing. Negative lachmanns. Negative mcmurrays, apleys, patellar apprehension. NV intact distally.    Assessment & Plan:  1. Right knee pain - 2/2 arthritis.  Given intraarticular cortisone injection today.  Discussed tylenol, glucosamine, capsaicin. Advised avoiding nsaids.  Shown home exercises to do daily.  Consider PT, inserts with good arch support, heat/ice.  F/u in 1 month or prn.  After informed written consent, patient was seated on  exam table. Right knee was prepped with alcohol swab and utilizing anterolateral approach, patient's right knee was injected intraarticularly with 3:1 marcaine: depomedrol. Patient tolerated the procedure well without immediate complications.

## 2014-10-01 ENCOUNTER — Encounter (HOSPITAL_BASED_OUTPATIENT_CLINIC_OR_DEPARTMENT_OTHER): Payer: Self-pay | Admitting: Emergency Medicine

## 2014-10-01 ENCOUNTER — Emergency Department (HOSPITAL_BASED_OUTPATIENT_CLINIC_OR_DEPARTMENT_OTHER)
Admission: EM | Admit: 2014-10-01 | Discharge: 2014-10-01 | Disposition: A | Discharge status: Home / Self Care | Payer: Medicare Other | Attending: Emergency Medicine | Admitting: Emergency Medicine

## 2014-10-01 ENCOUNTER — Emergency Department (HOSPITAL_BASED_OUTPATIENT_CLINIC_OR_DEPARTMENT_OTHER): Payer: Medicare Other

## 2014-10-01 DIAGNOSIS — K5900 Constipation, unspecified: Secondary | ICD-10-CM | POA: Insufficient documentation

## 2014-10-01 DIAGNOSIS — Z86010 Personal history of colonic polyps: Secondary | ICD-10-CM | POA: Diagnosis not present

## 2014-10-01 DIAGNOSIS — Z880 Allergy status to penicillin: Secondary | ICD-10-CM | POA: Insufficient documentation

## 2014-10-01 DIAGNOSIS — Z87438 Personal history of other diseases of male genital organs: Secondary | ICD-10-CM | POA: Diagnosis not present

## 2014-10-01 DIAGNOSIS — M1990 Unspecified osteoarthritis, unspecified site: Secondary | ICD-10-CM | POA: Insufficient documentation

## 2014-10-01 DIAGNOSIS — Z87891 Personal history of nicotine dependence: Secondary | ICD-10-CM | POA: Insufficient documentation

## 2014-10-01 DIAGNOSIS — Z79899 Other long term (current) drug therapy: Secondary | ICD-10-CM | POA: Insufficient documentation

## 2014-10-01 DIAGNOSIS — Z7982 Long term (current) use of aspirin: Secondary | ICD-10-CM | POA: Diagnosis not present

## 2014-10-01 MED ORDER — PEG 3350-KCL-NA BICARB-NACL 420 G PO SOLR: 4000.0000 mL | Freq: Once | ORAL | Status: DC

## 2014-10-01 NOTE — ED Notes (Addendum)
Pt states issues with constipation since starting a sinus medication 2 months ago.  Worse in past 2 week and has since stopped taking that medication.  Pt denies any nausea, vomiting or diarrhea.  Able to produce small amounts of stool with much straining, but nothing "substantial'.  Pt states he feels "very full".  Pt has treated with mag citrate and miralax at home with no significant relief.

## 2014-10-01 NOTE — Discharge Instructions (Signed)

## 2014-10-01 NOTE — ED Provider Notes (Signed)
CSN: [id number]     Arrival date & time 10/01/14  0702 History   First MD Initiated Contact with Patient 10/01/14 [id number]     No chief complaint on file.    (Consider location/radiation/quality/duration/timing/severity/associated sxs/prior Treatment) Patient is a 81 y.o. male presenting with constipation. The history is provided by the patient.  Constipation Severity:  Moderate Time since last bowel movement:  4 weeks Timing:  Constant Progression:  Unchanged Chronicity:  New Context comment:  Possibly medication Stool description:  Small (soft) Relieved by:  Nothing Worsened by:  Nothing tried Ineffective treatments: miralax bid, magnesium citrate. Associated symptoms: no abdominal pain, no diarrhea, no dysuria, no fever, no nausea and no vomiting     Past Medical History  Diagnosis Date  . Colonic polyp   . Arthritis   . Hemorrhoid   . BPH (benign prostatic hypertrophy)    Past Surgical History  Procedure Laterality Date  . Total knee arthroplasty  2003    Dr Maureen Ralphs  . Hemorrhoid surgery     Family History  Problem Relation Age of Onset  . Cancer Mother   . Arthritis Sister    History  Substance Use Topics  . Smoking status: Former Smoker    Quit date: 06/17/1983  . Smokeless tobacco: Not on file  . Alcohol Use: No    Review of Systems  Constitutional: Negative for fever.  HENT: Negative for drooling and rhinorrhea.   Eyes: Negative for pain.  Respiratory: Negative for cough and shortness of breath.   Cardiovascular: Negative for chest pain and leg swelling.  Gastrointestinal: Positive for constipation. Negative for nausea, vomiting, abdominal pain and diarrhea.  Genitourinary: Negative for dysuria and hematuria.  Musculoskeletal: Negative for gait problem and neck pain.  Skin: Negative for color change.  Neurological: Negative for numbness and headaches.  Hematological: Negative for adenopathy.  Psychiatric/Behavioral: Negative for behavioral problems.   All other systems reviewed and are negative.     Allergies  Penicillins  Home Medications   Prior to Admission medications   Medication Sig Start Date End Date Taking? Authorizing Provider  acetaminophen (TYLENOL) 500 MG tablet Take 500 mg by mouth every other day. For pain    Historical Provider, MD  Ascorbic Acid (VITAMIN C) 500 MG tablet Take 1,000 mg by mouth daily.     Historical Provider, MD  aspirin 81 MG tablet Take 81 mg by mouth daily.    Historical Provider, MD  calcium carbonate (OS-CAL) 600 MG TABS Take 600 mg by mouth daily.    Historical Provider, MD  Cholecalciferol (VITAMIN D) 2000 UNITS CAPS Take 1 capsule by mouth daily.    Historical Provider, MD  desloratadine (CLARINEX) 5 MG tablet Take 1 tablet (5 mg total) by mouth daily. 09/18/14   [name] Lowne, DO  fluticasone (FLONASE) 50 MCG/ACT nasal spray Place 2 sprays into both nostrils daily. 07/04/14   Rosalita Chessman, DO  levocetirizine (XYZAL) 5 MG tablet Take 1 tablet (5 mg total) by mouth every evening. 07/04/14   Rosalita Chessman, DO  omeprazole (PRILOSEC) 40 MG capsule Take 1 capsule (40 mg total) by mouth daily. 08/08/13   [name] Lowne, DO  Saw Palmetto 500 MG CAPS Take 1 capsule by mouth every other day.     Historical Provider, MD   BP 130/76 mmHg  Pulse 82  Temp(Src) 98.1 F (36.7 C) (Oral)  Resp 18  Ht 6' (1.829 m)  Wt 204 lb (92.534 kg)  BMI 27.66 kg/m2  SpO2 98% Physical Exam  Constitutional: He is oriented to person, place, and time. He appears well-developed and well-nourished.  HENT:  Head: Normocephalic and atraumatic.  Right Ear: External ear normal.  Left Ear: External ear normal.  Nose: Nose normal.  Mouth/Throat: Oropharynx is clear and moist. No oropharyngeal exudate.  Eyes: Conjunctivae and EOM are normal. Pupils are equal, round, and reactive to light.  Neck: Normal range of motion. Neck supple.  Cardiovascular: Normal rate, regular rhythm, normal heart sounds and intact distal pulses.   Exam reveals no gallop and no friction rub.   No murmur heard. Pulmonary/Chest: Effort normal and breath sounds normal. No respiratory distress. He has no wheezes.  Abdominal: Soft. Bowel sounds are normal. He exhibits no distension. There is no tenderness. There is no rebound and no guarding.  Genitourinary:  Normal-appearing external rectum. Otherwise normal rectal exam with brown stool. No fecal impaction noted.  Musculoskeletal: Normal range of motion. He exhibits no edema or tenderness.  Neurological: He is alert and oriented to person, place, and time.  Skin: Skin is warm and dry.  Psychiatric: He has a normal mood and affect. His behavior is normal.  Nursing note and vitals reviewed.   ED Course  Procedures (including critical care time) Labs Review Labs Reviewed - No data to display  Imaging Review Dg Abd 2 Views  10/01/2014   CLINICAL DATA:  Constipation and abdominal pain.  EXAM: ABDOMEN - 2 VIEW  COMPARISON:  11/26/2011  FINDINGS: There is a large amount of stool throughout the colon and down into the rectosigmoid area consistent with constipation. No distended small bowel loops to suggest obstruction. The soft tissue shadows are maintained. No free air. The bony structures are grossly normal.  IMPRESSION: Large amount of stool throughout the colon suggesting constipation.   Electronically Signed   By: Marijo Sanes M.D.   On: 10/01/2014 08:02     EKG Interpretation None      MDM   Final diagnoses:  Constipation    7:28 AM 81 y.o. male who presents with the complaint of constipation for the last month. He saw his PCP on April 4 who recommended discontinuing his allergy medicine and calcium and increase his water intake. He states he has been taking miralax twice a day and took magnesium citrate twice this week without significant relief. He has a very small bowel movement every few days. The stool is soft and nonbloody. He denies any pain. Vital signs unremarkable here.  Denies vomiting and otherwise is doing well. Rectal exam is normal. We'll get screening plain film of abdomen.  8:14 AM: Imaging c/w large amount of stool in the colon. Given failure of typical modalities, will rec otc enema and if this doesn't work [employer] Rx.  I have discussed the diagnosis/risks/treatment options with the patient and family and believe the pt to be eligible for discharge home to follow-up with his pcp for continued constipation. We also discussed returning to the ED immediately if new or worsening sx occur. We discussed the sx which are most concerning (e.g., abd pain, fever, vomiting, nausea, abd distension) that necessitate immediate return. Medications administered to the patient during their visit and any new prescriptions provided to the patient are listed below.  Medications given during this visit Medications - No data to display  New Prescriptions   POLYETHYLENE GLYCOL-ELECTROLYTES (NULYTELY/GOLYTELY) 420 G SOLUTION    Take 4,000 mLs by mouth once.       Pamella Pert, MD 10/01/14 [id number]

## 2014-10-05 ENCOUNTER — Emergency Department (HOSPITAL_BASED_OUTPATIENT_CLINIC_OR_DEPARTMENT_OTHER): Payer: Medicare Other

## 2014-10-05 ENCOUNTER — Encounter (HOSPITAL_BASED_OUTPATIENT_CLINIC_OR_DEPARTMENT_OTHER): Payer: Self-pay | Admitting: *Deleted

## 2014-10-05 ENCOUNTER — Emergency Department (HOSPITAL_BASED_OUTPATIENT_CLINIC_OR_DEPARTMENT_OTHER)
Admission: EM | Admit: 2014-10-05 | Discharge: 2014-10-05 | Disposition: A | Discharge status: Home / Self Care | Payer: Medicare Other | Attending: Emergency Medicine | Admitting: Emergency Medicine

## 2014-10-05 DIAGNOSIS — N400 Enlarged prostate without lower urinary tract symptoms: Secondary | ICD-10-CM | POA: Diagnosis not present

## 2014-10-05 DIAGNOSIS — K5901 Slow transit constipation: Secondary | ICD-10-CM | POA: Diagnosis not present

## 2014-10-05 DIAGNOSIS — Z86010 Personal history of colonic polyps: Secondary | ICD-10-CM | POA: Diagnosis not present

## 2014-10-05 DIAGNOSIS — Z7951 Long term (current) use of inhaled steroids: Secondary | ICD-10-CM | POA: Insufficient documentation

## 2014-10-05 DIAGNOSIS — Z87891 Personal history of nicotine dependence: Secondary | ICD-10-CM | POA: Diagnosis not present

## 2014-10-05 DIAGNOSIS — K5900 Constipation, unspecified: Secondary | ICD-10-CM | POA: Diagnosis not present

## 2014-10-05 DIAGNOSIS — M1990 Unspecified osteoarthritis, unspecified site: Secondary | ICD-10-CM | POA: Insufficient documentation

## 2014-10-05 DIAGNOSIS — R011 Cardiac murmur, unspecified: Secondary | ICD-10-CM | POA: Diagnosis not present

## 2014-10-05 DIAGNOSIS — Z79899 Other long term (current) drug therapy: Secondary | ICD-10-CM | POA: Diagnosis not present

## 2014-10-05 DIAGNOSIS — R140 Abdominal distension (gaseous): Secondary | ICD-10-CM | POA: Diagnosis not present

## 2014-10-05 DIAGNOSIS — Z7982 Long term (current) use of aspirin: Secondary | ICD-10-CM | POA: Insufficient documentation

## 2014-10-05 DIAGNOSIS — Z880 Allergy status to penicillin: Secondary | ICD-10-CM | POA: Insufficient documentation

## 2014-10-05 DIAGNOSIS — M5136 Other intervertebral disc degeneration, lumbar region: Secondary | ICD-10-CM | POA: Diagnosis not present

## 2014-10-05 MED ORDER — LACTULOSE 10 GM/15ML PO SOLN: 20.0000 g | Freq: Two times a day (BID) | ORAL | Status: DC | PRN

## 2014-10-05 NOTE — ED Notes (Signed)
Patient transported to X-ray 

## 2014-10-05 NOTE — ED Notes (Signed)
Patient preparing for discharge. 

## 2014-10-05 NOTE — Discharge Instructions (Signed)

## 2014-10-05 NOTE — ED Notes (Signed)
Pt amb to room 6 with quick steady gait in nad. Pt reports being seen here on Sunday am with constipation, per pt, "the xray showed I was FULL of it!" pt states he went home and had an enema, which produced "a whole lot of good results". Monday he drank a bottle of golytely, with "really good results". Pt states Tuesday he passed "a lot of water" and yesterday and today all he is able to pass is gas. Pt is concerned he is constipated again.

## 2014-10-05 NOTE — ED Notes (Signed)
MD at bedside. 

## 2014-10-05 NOTE — ED Provider Notes (Signed)
CSN: [id number]     Arrival date & time 10/05/14  0747 History   First MD Initiated Contact with Patient 10/05/14 [phone]     Chief Complaint  Patient presents with  . Constipation     (Consider location/radiation/quality/duration/timing/severity/associated sxs/prior Treatment) Patient is a 81 y.o. male presenting with constipation. The history is provided by the patient and the spouse.  Constipation Severity:  Severe Time since last bowel movement:  4 weeks Timing:  Constant Progression:  Partially resolved Chronicity:  New Context: medication   Context comment:  Patient had recently started an allergy medication and calcium which he discontinued to go Stool description:  Small and watery Unusual stool frequency:  Patient used an enema and took an entire container of GoLYTELY 4 days ago. He has had a few watery stool and a small amount of stool past but still feels constipated Relieved by:  Nothing Worsened by:  Nothing tried Ineffective treatments:  Enemas, Miralax and laxatives Associated symptoms: flatus   Associated symptoms: no abdominal pain, no anorexia, no back pain, no diarrhea, no dysuria, no fever, no nausea, no urinary retention and no vomiting   Risk factors: change in medication   Risk factors: no hx of abdominal surgery, no recent antibiotic use, no recent illness, no recent surgery and no recent travel     Past Medical History  Diagnosis Date  . Colonic polyp   . Arthritis   . Hemorrhoid   . BPH (benign prostatic hypertrophy)    Past Surgical History  Procedure Laterality Date  . Total knee arthroplasty  2003    Dr Maureen Ralphs  . Hemorrhoid surgery     Family History  Problem Relation Age of Onset  . Cancer Mother   . Arthritis Sister    History  Substance Use Topics  . Smoking status: Former Smoker    Quit date: 06/17/1983  . Smokeless tobacco: Not on file  . Alcohol Use: No    Review of Systems  Constitutional: Negative for fever.   Gastrointestinal: Positive for constipation and flatus. Negative for nausea, vomiting, abdominal pain, diarrhea and anorexia.  Genitourinary: Negative for dysuria.  Musculoskeletal: Negative for back pain.  All other systems reviewed and are negative.     Allergies  Penicillins  Home Medications   Prior to Admission medications   Medication Sig Start Date End Date Taking? Authorizing Provider  acetaminophen (TYLENOL) 500 MG tablet Take 500 mg by mouth every other day. For pain    Historical Provider, MD  Ascorbic Acid (VITAMIN C) 500 MG tablet Take 1,000 mg by mouth daily.     Historical Provider, MD  aspirin 81 MG tablet Take 81 mg by mouth daily.    Historical Provider, MD  calcium carbonate (OS-CAL) 600 MG TABS Take 600 mg by mouth daily.    Historical Provider, MD  Cholecalciferol (VITAMIN D) 2000 UNITS CAPS Take 1 capsule by mouth daily.    Historical Provider, MD  desloratadine (CLARINEX) 5 MG tablet Take 1 tablet (5 mg total) by mouth daily. 09/18/14   [name] Lowne, DO  fluticasone (FLONASE) 50 MCG/ACT nasal spray Place 2 sprays into both nostrils daily. 07/04/14   Rosalita Chessman, DO  lactulose (CHRONULAC) 10 GM/15ML solution Take 30 mLs (20 g total) by mouth 2 (two) times daily as needed for moderate constipation or severe constipation. 10/05/14   Blanchie Dessert, MD  levocetirizine (XYZAL) 5 MG tablet Take 1 tablet (5 mg total) by mouth every evening. 07/04/14   Kendrick Fries  R Lowne, DO  omeprazole (PRILOSEC) 40 MG capsule Take 1 capsule (40 mg total) by mouth daily. 08/08/13   Rosalita Chessman, DO  polyethylene glycol-electrolytes (NULYTELY/GOLYTELY) 420 G solution Take 4,000 mLs by mouth once. 10/01/14   Pamella Pert, MD  Saw Palmetto 500 MG CAPS Take 1 capsule by mouth every other day.     Historical Provider, MD   BP 133/78 mmHg  Pulse 89  Temp(Src) 97.8 F (36.6 C) (Oral)  Resp 18  SpO2 97% Physical Exam  Constitutional: He is oriented to person, place, and time. He appears  well-developed and well-nourished. No distress.  HENT:  Head: Normocephalic and atraumatic.  Mouth/Throat: Oropharynx is clear and moist.  Eyes: Conjunctivae and EOM are normal. Pupils are equal, round, and reactive to light.  Neck: Normal range of motion. Neck supple.  Cardiovascular: Normal rate, regular rhythm and intact distal pulses.   Murmur heard.  Systolic murmur is present with a grade of 1/6  Heard best at the LSB  Pulmonary/Chest: Effort normal and breath sounds normal. No respiratory distress. He has no wheezes. He has no rales.  Abdominal: Soft. Bowel sounds are normal. He exhibits no distension. There is no tenderness. There is no rebound and no guarding.  Musculoskeletal: Normal range of motion. He exhibits no edema or tenderness.  Neurological: He is alert and oriented to person, place, and time.  Skin: Skin is warm and dry. No rash noted. No erythema.  Psychiatric: He has a normal mood and affect. His behavior is normal.  Nursing note and vitals reviewed.   ED Course  Procedures (including critical care time) Labs Review Labs Reviewed - No data to display  Imaging Review Dg Abd 1 View  10/05/2014   CLINICAL DATA:  Abdominal pain, constipation for 2 days  EXAM: ABDOMEN - 1 VIEW  COMPARISON:  10/01/2014  FINDINGS: Normal small bowel gas pattern. Abundant stool noted in right colon. Moderate gas noted in proximal sigmoid colon. Again noted degenerative changes lumbar spine.  IMPRESSION: Normal small bowel gas pattern. Abundant colonic stool in right colon. Moderate gas in proximal sigmoid colon.   Electronically Signed   By: Lahoma Crocker M.D.   On: 10/05/2014 08:33     EKG Interpretation None      MDM   Final diagnoses:  Slow transit constipation    Patient presents today for a follow-up constipation. He was seen 4 days ago and at that time diagnosed with constipation and given GoLYTELY and an outpatient enema. Patient did both of those things and states he was  able to pass some stool but still feels constipated. He continues to deny nausea, vomiting, abdominal pain. He has been eating a normal diet and continuing his normal activity. All this started about a month ago when he started an allergy medication and calcium which he stopped 2 weeks ago. Exam is negative for any abdominal pain and patient has positive bowel movements. He is otherwise well-appearing on exam.  Patient has no symptoms concerning for appendicitis, diverticulitis, pancreatitis, cholecystitis, small bowel obstruction. Repeat x-ray shows improvement in left colonic stool burden but he also continues to have significant stool in his right colon.  Will have patient start on lactulose and follow up with his doctor next week if symptoms are not resolved. Patient cautioned about development of fever, nausea, vomiting and abdominal pain. He was urged to return if any of the start. Patient's wife is also present and they're both agreeable with plan.   Nyaja [place]  Maryan Rued, MD 10/05/14 [phone]

## 2014-10-06 ENCOUNTER — Telehealth: Payer: Self-pay | Admitting: *Deleted

## 2014-10-06 NOTE — Telephone Encounter (Signed)
Pt has been to ER 2 x i believe for constipation--- would he like for [place] to get him to GI? If yes... Ok to put referral in.     ----- Message -----     From: SYSTEM     Sent: 10/05/2014 10:30 AM      To: Rosalita Chessman, DO        [place] patient and offered referral.  Patient states he was given laxatives and is not having an issue at this time, he would like to think about it over the weekend and will call back Monday with decision.

## 2014-10-08 NOTE — Telephone Encounter (Signed)
noted 

## 2014-10-09 ENCOUNTER — Encounter: Payer: Self-pay | Admitting: Family Medicine

## 2014-10-09 NOTE — Telephone Encounter (Signed)
Pt checking the status of GI referral, pt states he feels extremely constipated.

## 2014-10-10 ENCOUNTER — Telehealth: Payer: Self-pay | Admitting: Gastroenterology

## 2014-10-10 ENCOUNTER — Other Ambulatory Visit: Payer: Self-pay | Admitting: Family Medicine

## 2014-10-10 DIAGNOSIS — K5901 Slow transit constipation: Secondary | ICD-10-CM

## 2014-10-10 NOTE — Telephone Encounter (Signed)
Spoke with the patient. He has had recent ER visits due to his constipation. He has seen Dr Deatra Ina in the past and had a colonoscopy. Appointment scheduled as a new patient to update his information 10/17/14.

## 2014-10-17 ENCOUNTER — Encounter: Payer: Self-pay | Admitting: Physician Assistant

## 2014-10-17 ENCOUNTER — Ambulatory Visit (INDEPENDENT_AMBULATORY_CARE_PROVIDER_SITE_OTHER): Payer: Medicare Other | Admitting: Physician Assistant

## 2014-10-17 ENCOUNTER — Other Ambulatory Visit (INDEPENDENT_AMBULATORY_CARE_PROVIDER_SITE_OTHER): Payer: Medicare Other

## 2014-10-17 VITALS — BP 118/72 | HR 72 | Ht 69.0 in | Wt 203.2 lb

## 2014-10-17 DIAGNOSIS — K5900 Constipation, unspecified: Secondary | ICD-10-CM

## 2014-10-17 LAB — CBC WITH DIFFERENTIAL/PLATELET
BASOS ABS: 0 10*3/uL (ref 0.0–0.1)
BASOS PCT: 0.5 % (ref 0.0–3.0)
EOS PCT: 4.1 % (ref 0.0–5.0)
Eosinophils Absolute: 0.3 10*3/uL (ref 0.0–0.7)
HCT: 45 % (ref 39.0–52.0)
HEMOGLOBIN: 15.6 g/dL (ref 13.0–17.0)
LYMPHS ABS: 2.8 10*3/uL (ref 0.7–4.0)
Lymphocytes Relative: 45.8 % (ref 12.0–46.0)
MCHC: 34.6 g/dL (ref 30.0–36.0)
MCV: 95.6 fl (ref 78.0–100.0)
MONOS PCT: 6.6 % (ref 3.0–12.0)
Monocytes Absolute: 0.4 10*3/uL (ref 0.1–1.0)
Neutro Abs: 2.7 10*3/uL (ref 1.4–7.7)
Neutrophils Relative %: 43 % (ref 43.0–77.0)
Platelets: 158 10*3/uL (ref 150.0–400.0)
RBC: 4.7 Mil/uL (ref 4.22–5.81)
RDW: 13 % (ref 11.5–15.5)
WBC: 6.2 10*3/uL (ref 4.0–10.5)

## 2014-10-17 LAB — COMPREHENSIVE METABOLIC PANEL
ALT: 9 U/L (ref 0–53)
AST: 14 U/L (ref 0–37)
Albumin: 4 g/dL (ref 3.5–5.2)
Alkaline Phosphatase: 62 U/L (ref 39–117)
BUN: 15 mg/dL (ref 6–23)
CALCIUM: 9.7 mg/dL (ref 8.4–10.5)
CO2: 30 mEq/L (ref 19–32)
Chloride: 103 mEq/L (ref 96–112)
Creatinine, Ser: 0.88 mg/dL (ref 0.40–1.50)
GFR: 86.46 mL/min (ref >60.00)
Glucose, Bld: 113 mg/dL — ABNORMAL HIGH (ref 70–99)
POTASSIUM: 4.7 meq/L (ref 3.5–5.1)
Sodium: 138 mEq/L (ref 135–145)
Total Bilirubin: 0.7 mg/dL (ref 0.2–1.2)
Total Protein: 7.2 g/dL (ref 6.0–8.3)

## 2014-10-17 LAB — TSH: TSH: 2.15 u[IU]/mL (ref 0.35–4.50)

## 2014-10-17 MED ORDER — LACTULOSE 10 GM/15ML PO SOLN: ORAL | Status: DC

## 2014-10-17 NOTE — Progress Notes (Signed)
Patient ID: Kevin Glass, male   DOB: 1922/12/01, 81 y.o.   MRN: [id number]   Subjective:    Patient ID: Kevin Glass, male    DOB: [birth year], 81 y.o.   MRN: [id number]  HPI Kevin Glass is a very nice 81 year old white male known to Dr. Deatra Ina with history of coronary artery disease, GERD, BPH, arthritis, and history of colon polyps. His last colonoscopy was done in 2008 at which time he had one diminutive polyp removed which were path revealed to be benign colonic mucosa also was noted to have left-sided diverticulosis. Patient comes in today with new onset of constipation. He says his symptoms started early in April and he has not had chronic problems with constipation. Been taking calcium and supplementation and an allergy pill both for some time and these were stopped when constipation developed. He actually did not have a bowel movement for couple of weeks and went to the emergency room on 10/01/2014. He had KUB done at that time which did show significant constipation, no obstruction area and he was given a gallon of GoLYTELY to purchase bowel and an enema. He took the GoLYTELY and still did not pass much stool returned to the ER on 10/05/2014 had repeat abdominal films done which showed decreased stool burden but still had a significant amount of stool in the right colon. At that time he was started on lactulose 30 mL twice daily which has helped. He says at this point he is feeling fine, not having any abdominal discomfort, when he was quite constipated he was having a lot of gas postprandially. He says he is eating without difficulty has no complaints of abdominal pain and is having bowel movements on a daily basis. He has not noted any melena or hematochezia. He is tolerating the lactulose well  Review of Systems Pertinent positive and negative review of systems were noted in the above HPI section.  All other review of systems was otherwise negative.  Outpatient Encounter Prescriptions as of  10/17/2014  Medication Sig  . acetaminophen (TYLENOL) 500 MG tablet Take 500 mg by mouth every other day. For pain  . Ascorbic Acid (VITAMIN C) 500 MG tablet Take 1,000 mg by mouth daily.   [name] aspirin 81 MG tablet Take 81 mg by mouth every other day.  . Cholecalciferol (VITAMIN D) 2000 UNITS CAPS Take 1 capsule by mouth daily.  [name] levocetirizine (XYZAL) 5 MG tablet Take 1 tablet (5 mg total) by mouth every evening.  [name] omeprazole (PRILOSEC) 40 MG capsule Take 1 capsule (40 mg total) by mouth daily.  . Saw Palmetto 500 MG CAPS Take 1 capsule by mouth every other day.   . lactulose (CHRONULAC) 10 GM/15ML solution Take 30 cc's twice daily as needed for constipation  . [DISCONTINUED] calcium carbonate (OS-CAL) 600 MG TABS Take 600 mg by mouth daily.  . [DISCONTINUED] desloratadine (CLARINEX) 5 MG tablet Take 1 tablet (5 mg total) by mouth daily. (Patient not taking: Reported on 10/17/2014)  . [DISCONTINUED] fluticasone (FLONASE) 50 MCG/ACT nasal spray Place 2 sprays into both nostrils daily. (Patient not taking: Reported on 10/17/2014)  . [DISCONTINUED] lactulose (CHRONULAC) 10 GM/15ML solution Take 30 mLs (20 g total) by mouth 2 (two) times daily as needed for moderate constipation or severe constipation. (Patient not taking: Reported on 10/17/2014)  . [DISCONTINUED] polyethylene glycol-electrolytes (NULYTELY/GOLYTELY) 420 G solution Take 4,000 mLs by mouth once. (Patient not taking: Reported on 10/17/2014)   No facility-administered encounter medications on file as of  10/17/2014.   Allergies  Allergen Reactions  . Penicillins Rash    White blister on the skin    Patient Active Problem List   Diagnosis Date Noted  . Right knee pain 09/21/2014  . Constipation 09/18/2014  . Viral gastroenteritis 04/13/2014  . Wellness examination 04/05/2014  . Obesity (BMI 30-39.9) 05/27/2013  . URI (upper respiratory infection) 11/21/2010  . DIZZINESS AND GIDDINESS 12/18/2009  . NONSPECIFIC ABNORMAL ELECTROCARDIOGRAM  06/27/2009  . HEMORRHOIDS 08/26/2007  . DIVERTICULOSIS, COLON 08/26/2007  . ARTHRITIS 08/26/2007  . COLONIC POLYPS 05/04/2007  . IMPAIRED GLUCOSE TOLERANCE 02/23/2007  . HYPRTRPHY PROSTATE BNG W/O URINARY OBST/LUTS 02/23/2007   History   Social History  . Marital Status: Married    Spouse Name: N/A  . Number of Children: N/A  . Years of Education: N/A   Occupational History  . retired    Social History Main Topics  . Smoking status: Former Smoker    Quit date: 06/17/1983  . Smokeless tobacco: Not on file  . Alcohol Use: No  . Drug Use: No  . Sexual Activity:    Partners: Female   Other Topics Concern  . Not on file   Social History Narrative    Mr. Hogston family history includes Arthritis in his sister; Cancer in his mother.      Objective:    Filed Vitals:   10/17/14 1528  BP: 118/72  Pulse: 72    Physical Exam  well-developed elderly white male in no acute distress, pleasant accompanied by his wife blood pressure 118/72 pulse 72 height 5 foot 9 weight 203. HEENT; nontraumatic normocephalic EOMI PERRLA sclera anicteric, Supple ;no JVD, Cardiovascular; regular rate and rhythm with S1-S2 no murmur or gallop, Pulmonary ;clear bilaterally, Abdomen ;soft nondistended nontender bowel sounds are present there is no palpable mass or hepatosplenomegaly does wear lumbar back brace, Rectal ;exam not done, Ext;no clubbing cyanosis or edema skin warm and dry, Psych; mood and affect appropriate       Assessment & Plan:   #1 81 yo male with acute episode of significant constipation- now resolved  #2 diverticulosis #3 benign colon polyp2008  Plan- do not feel colonoscopy indicated at this time He will liberalize fluid/water intake Continue Lactulose 30 cc BID- we discussed alteration of doseage based on Bm;s. He will return to office on a prn basis    Amy Genia Harold PA-C 10/17/2014   Cc: Rosalita Chessman, DO

## 2014-10-17 NOTE — Patient Instructions (Signed)
Please go to the basement level to have your labs drawn.  We sent refills for the Lactulose, take 30 cc's twice daily for 1 month. Refills provided.  Follow up with Amy or Dr. Deatra Ina as needed.

## 2014-10-18 NOTE — Progress Notes (Signed)
Reviewed and agree with management. Candiace West D. Lavana Huckeba, M.D., FACG  

## 2014-12-11 ENCOUNTER — Other Ambulatory Visit: Payer: Self-pay

## 2014-12-14 DIAGNOSIS — L820 Inflamed seborrheic keratosis: Secondary | ICD-10-CM | POA: Diagnosis not present

## 2014-12-14 DIAGNOSIS — L570 Actinic keratosis: Secondary | ICD-10-CM | POA: Diagnosis not present

## 2015-01-01 ENCOUNTER — Ambulatory Visit (INDEPENDENT_AMBULATORY_CARE_PROVIDER_SITE_OTHER): Payer: Medicare Other | Admitting: Physician Assistant

## 2015-01-01 ENCOUNTER — Encounter: Payer: Self-pay | Admitting: Physician Assistant

## 2015-01-01 VITALS — BP 110/70 | HR 91 | Temp 98.0°F | Ht 69.0 in | Wt 203.6 lb

## 2015-01-01 DIAGNOSIS — M67911 Unspecified disorder of synovium and tendon, right shoulder: Secondary | ICD-10-CM | POA: Diagnosis not present

## 2015-01-01 MED ORDER — TRAMADOL HCL 50 MG PO TABS: 50.0000 mg | ORAL_TABLET | Freq: Three times a day (TID) | ORAL | Status: DC | PRN

## 2015-01-01 NOTE — Progress Notes (Signed)
Pre visit review using our clinic review tool, if applicable. No additional management support is needed unless otherwise documented below in the visit note. 

## 2015-01-01 NOTE — Patient Instructions (Signed)
No heavy lifting or overexertion. Alternate ice and heat to the area in 10-minute intervals about 3 times per day. Take 2 Advil in the morning. Use Tylenol later in the day if needed for breakthrough pain. Save Tramadol to use only if pain becomes severe.  Follow-up on Friday. If not improving, we will need to get imaging.

## 2015-01-02 DIAGNOSIS — M67919 Unspecified disorder of synovium and tendon, unspecified shoulder: Secondary | ICD-10-CM

## 2015-01-02 HISTORY — DX: Unspecified disorder of synovium and tendon, unspecified shoulder: M67.919

## 2015-01-02 NOTE — Progress Notes (Signed)
Patient presents to clinic today c/o pain in R shoulder over past couple of weeks that worsened over the weekend after an episode of heavy lifting. Denies known trauma or injury. Denies numbness or tingling of extremity. Pain is present with ROM of shoulder. Patient denies weakness of extremity but cannot lift arm over head due to pain. Denies prior injury to rotator cuff. Has not taken anything for pain.  Past Medical History  Diagnosis Date  . Colonic polyp   . Arthritis   . Hemorrhoid   . BPH (benign prostatic hypertrophy)     Current Outpatient Prescriptions on File Prior to Visit  Medication Sig Dispense Refill  . acetaminophen (TYLENOL) 500 MG tablet Take 500 mg by mouth every other day. For pain    . Ascorbic Acid (VITAMIN C) 500 MG tablet Take 1,000 mg by mouth daily.     [name] aspirin 81 MG tablet Take 81 mg by mouth every other day.    . Cholecalciferol (VITAMIN D) 2000 UNITS CAPS Take 1 capsule by mouth daily.    [name] lactulose (CHRONULAC) 10 GM/15ML solution Take 30 cc's twice daily as needed for constipation 1800 mL 11  . Saw Palmetto 500 MG CAPS Take 1 capsule by mouth every other day.      No current facility-administered medications on file prior to visit.    Allergies  Allergen Reactions  . Penicillins Rash    White blister on the skin     Family History  Problem Relation Age of Onset  . Cancer Mother   . Arthritis Sister     History   Social History  . Marital Status: Married    Spouse Name: N/A  . Number of Children: N/A  . Years of Education: N/A   Occupational History  . retired    Social History Main Topics  . Smoking status: Former Smoker    Quit date: 06/17/1983  . Smokeless tobacco: Not on file  . Alcohol Use: No  . Drug Use: No  . Sexual Activity:    Partners: Female   Other Topics Concern  . None   Social History Narrative   Review of Systems - See HPI.  All other ROS are negative.  BP 110/70 mmHg  Pulse 91  Temp(Src) 98 F  (36.7 C) (Oral)  Ht 5\' 9"  (1.753 m)  Wt 203 lb 9.6 oz (92.352 kg)  BMI 30.05 kg/m2  SpO2 97%  Physical Exam  Constitutional: He is oriented to person, place, and time and well-developed, well-nourished, and in no distress.  HENT:  Head: Normocephalic and atraumatic.  Eyes: Conjunctivae are normal.  Cardiovascular: Normal rate, regular rhythm, normal heart sounds and intact distal pulses.   Pulmonary/Chest: Effort normal and breath sounds normal.  Musculoskeletal:       Right shoulder: He exhibits decreased range of motion, tenderness and pain. He exhibits no bony tenderness and normal strength.       Cervical back: He exhibits normal range of motion and no tenderness.  Decreased ROM is secondary to pain with abduction > 90 degrees and rotation of shoulder  Neurological: He is alert and oriented to person, place, and time. He has normal reflexes.  Skin: Skin is warm and dry. No rash noted.  Vitals reviewed.   Recent Results (from the past 2160 hour(s))  CBC with Differential/Platelet     Status: None   Collection Time: 10/17/14  4:01 PM  Result Value Ref Range   WBC 6.2 4.0 -  10.5 K/uL   RBC 4.70 4.22 - 5.81 Mil/uL   Hemoglobin 15.6 13.0 - 17.0 g/dL   HCT 45.0 39.0 - 52.0 %   MCV 95.6 78.0 - 100.0 fl   MCHC 34.6 30.0 - 36.0 g/dL   RDW 13.0 11.5 - 15.5 %   Platelets 158.0 150.0 - 400.0 K/uL   Neutrophils Relative % 43.0 43.0 - 77.0 %   Lymphocytes Relative 45.8 12.0 - 46.0 %   Monocytes Relative 6.6 3.0 - 12.0 %   Eosinophils Relative 4.1 0.0 - 5.0 %   Basophils Relative 0.5 0.0 - 3.0 %   Neutro Abs 2.7 1.4 - 7.7 K/uL   Lymphs Abs 2.8 0.7 - 4.0 K/uL   Monocytes Absolute 0.4 0.1 - 1.0 K/uL   Eosinophils Absolute 0.3 0.0 - 0.7 K/uL   Basophils Absolute 0.0 0.0 - 0.1 K/uL  Comprehensive metabolic panel     Status: Abnormal   Collection Time: 10/17/14  4:01 PM  Result Value Ref Range   Sodium 138 135 - 145 mEq/L   Potassium 4.7 3.5 - 5.1 mEq/L   Chloride 103 96 - 112 mEq/L    CO2 30 19 - 32 mEq/L   Glucose, Bld 113 (H) 70 - 99 mg/dL   BUN 15 6 - 23 mg/dL   Creatinine, Ser 0.88 0.40 - 1.50 mg/dL   Total Bilirubin 0.7 0.2 - 1.2 mg/dL   Alkaline Phosphatase 62 39 - 117 U/L   AST 14 0 - 37 U/L   ALT 9 0 - 53 U/L   Total Protein 7.2 6.0 - 8.3 g/dL   Albumin 4.0 3.5 - 5.2 g/dL   Calcium 9.7 8.4 - 10.5 mg/dL   GFR 86.46 >60.00 mL/min  TSH     Status: None   Collection Time: 10/17/14  4:01 PM  Result Value Ref Range   TSH 2.15 0.35 - 4.50 uIU/mL    Assessment/Plan: Tendinopathy of rotator cuff Doubt tear giving ROM id present without weakness. Limitation from pain only. Suspect tendinitis. RICE discussed. Topical Aspercreme. No heavy lifting over next 2 weeks. Advil in AM and Tylenol in PM for pain. Tramadol to use instead of Tylenol if pain is severe. Follow-up via phone on Friday. If no change at all or worsening, will proceed with Imaging.

## 2015-01-02 NOTE — Assessment & Plan Note (Signed)
Doubt tear giving ROM id present without weakness. Limitation from pain only. Suspect tendinitis. RICE discussed. Topical Aspercreme. No heavy lifting over next 2 weeks. Advil in AM and Tylenol in PM for pain. Tramadol to use instead of Tylenol if pain is severe. Follow-up via phone on Friday. If no change at all or worsening, will proceed with Imaging.

## 2015-01-05 ENCOUNTER — Telehealth: Payer: Self-pay | Admitting: Family Medicine

## 2015-01-05 NOTE — Telephone Encounter (Signed)
How wonderful! I am happy to hear he is feeling better.

## 2015-01-05 NOTE — Telephone Encounter (Signed)
Caller name: oryn Relation to pt: Call back number: [phone] Pharmacy:  Reason for call:   States that the inflammation is now gone from his shoulder. Just FYI

## 2015-01-22 ENCOUNTER — Encounter: Payer: Self-pay | Admitting: Family Medicine

## 2015-01-22 ENCOUNTER — Ambulatory Visit (INDEPENDENT_AMBULATORY_CARE_PROVIDER_SITE_OTHER): Payer: Medicare Other | Admitting: Family Medicine

## 2015-01-22 VITALS — BP 116/78 | HR 76 | Temp 98.2°F | Wt 205.4 lb

## 2015-01-22 DIAGNOSIS — L259 Unspecified contact dermatitis, unspecified cause: Secondary | ICD-10-CM | POA: Diagnosis not present

## 2015-01-22 MED ORDER — TRIAMCINOLONE ACETONIDE 0.1 % EX CREA: 1.0000 "application " | TOPICAL_CREAM | Freq: Two times a day (BID) | CUTANEOUS | Status: DC

## 2015-01-22 NOTE — Progress Notes (Signed)
Pre visit review using our clinic review tool, if applicable. No additional management support is needed unless otherwise documented below in the visit note. 

## 2015-01-22 NOTE — Progress Notes (Signed)
Subjective:    Patient ID: Kevin Glass, male    DOB: 10/31/1922, 81 y.o.   MRN: [id number]  HPI  Patient here c/o rash on both arms inside elbows x several days. No new lotions , detergents, , soaps,  He has not been working in the yard.  It is very itchy.   He used his wifes triamcinolone and it seems to be helping.  No other complaints.    Past Medical History  Diagnosis Date  . Colonic polyp   . Arthritis   . Hemorrhoid   . BPH (benign prostatic hypertrophy)     Review of Systems  Constitutional: Negative for diaphoresis, appetite change, fatigue and unexpected weight change.  Eyes: Negative for pain, redness and visual disturbance.  Respiratory: Negative for cough, chest tightness, shortness of breath and wheezing.   Cardiovascular: Negative for chest pain, palpitations and leg swelling.  Endocrine: Negative for cold intolerance, heat intolerance, polydipsia, polyphagia and polyuria.  Genitourinary: Negative for dysuria, frequency and difficulty urinating.  Skin: Positive for rash.  Neurological: Negative for dizziness, light-headedness, numbness and headaches.    Current Outpatient Prescriptions on File Prior to Visit  Medication Sig Dispense Refill  . acetaminophen (TYLENOL) 500 MG tablet Take 500 mg by mouth every other day. For pain    . Ascorbic Acid (VITAMIN C) 500 MG tablet Take 1,000 mg by mouth daily.     [name] aspirin 81 MG tablet Take 81 mg by mouth every other day.    . Cholecalciferol (VITAMIN D) 2000 UNITS CAPS Take 1 capsule by mouth daily.    [name] lactulose (CHRONULAC) 10 GM/15ML solution Take 30 cc's twice daily as needed for constipation 1800 mL 11  . Saw Palmetto 500 MG CAPS Take 1 capsule by mouth every other day.     . traMADol (ULTRAM) 50 MG tablet Take 1 tablet (50 mg total) by mouth every 8 (eight) hours as needed. 10 tablet 0   No current facility-administered medications on file prior to visit.       Objective:    Physical Exam    Constitutional: He is oriented to person, place, and time. Vital signs are normal. He appears well-developed and well-nourished. He is sleeping.  HENT:  Head: Normocephalic and atraumatic.  Mouth/Throat: Oropharynx is clear and moist.  Eyes: EOM are normal. Pupils are equal, round, and reactive to light.  Neck: Normal range of motion. Neck supple. No thyromegaly present.  Cardiovascular: Normal rate and regular rhythm.   No murmur heard. Pulmonary/Chest: Effort normal and breath sounds normal. No respiratory distress. He has no wheezes. He has no rales. He exhibits no tenderness.  Musculoskeletal: He exhibits no edema or tenderness.  Neurological: He is alert and oriented to person, place, and time.  Skin: Skin is warm and dry.     Psychiatric: He has a normal mood and affect. His behavior is normal. Judgment and thought content normal.    BP 116/78 mmHg  Pulse 76  Temp(Src) 98.2 F (36.8 C) (Oral)  Wt 205 lb 6.4 oz (93.169 kg)  SpO2 96% Wt Readings from Last 3 Encounters:  01/22/15 205 lb 6.4 oz (93.169 kg)  01/01/15 203 lb 9.6 oz (92.352 kg)  10/17/14 203 lb 3.2 oz (92.171 kg)     Lab Results  Component Value Date   WBC 6.2 10/17/2014   HGB 15.6 10/17/2014   HCT 45.0 10/17/2014   PLT 158.0 10/17/2014   GLUCOSE 113* 10/17/2014   CHOL 175 04/11/2014  TRIG 119.0 04/11/2014   HDL 33.80* 04/11/2014   LDLCALC 117* 04/11/2014   ALT 9 10/17/2014   AST 14 10/17/2014   NA 138 10/17/2014   K 4.7 10/17/2014   CL 103 10/17/2014   CREATININE 0.88 10/17/2014   BUN 15 10/17/2014   CO2 30 10/17/2014   TSH 2.15 10/17/2014   PSA 1.45 05/27/2013   HGBA1C 5.8 06/29/2009       Assessment & Plan:   Problem List Items Addressed This Visit    None    Visit Diagnoses    Contact dermatitis    -  Primary    Relevant Medications    triamcinolone cream (KENALOG) 0.1 %     refer to derm prn  I am having Kevin Glass start on triamcinolone cream. I am also having him maintain  his vitamin C, Saw Palmetto, acetaminophen, Vitamin D, aspirin, lactulose, and traMADol.  Meds ordered this encounter  Medications  . triamcinolone cream (KENALOG) 0.1 %    Sig: Apply 1 application topically 2 (two) times daily.    Dispense:  60 g    Refill:  Hart, DO

## 2015-01-22 NOTE — Patient Instructions (Signed)

## 2015-03-07 ENCOUNTER — Ambulatory Visit: Payer: Medicare Other

## 2015-03-07 DIAGNOSIS — Z23 Encounter for immunization: Secondary | ICD-10-CM

## 2015-03-07 MED ORDER — INFLUENZA VAC SPLIT QUAD 0.5 ML IM SUSY
0.5000 mL | PREFILLED_SYRINGE | Freq: Once | INTRAMUSCULAR | Status: AC
  Administered 2015-03-07: 0.5 mL via INTRAMUSCULAR

## 2015-04-26 DIAGNOSIS — L570 Actinic keratosis: Secondary | ICD-10-CM | POA: Diagnosis not present

## 2015-04-26 DIAGNOSIS — L821 Other seborrheic keratosis: Secondary | ICD-10-CM | POA: Diagnosis not present

## 2015-04-30 ENCOUNTER — Telehealth: Payer: Self-pay

## 2015-04-30 NOTE — Telephone Encounter (Signed)
AWV

## 2015-07-09 ENCOUNTER — Telehealth: Payer: Self-pay | Admitting: Family Medicine

## 2015-07-09 NOTE — Telephone Encounter (Signed)
[id number] ° °

## 2015-10-24 ENCOUNTER — Telehealth: Payer: Self-pay | Admitting: Behavioral Health

## 2015-10-24 ENCOUNTER — Encounter: Payer: Self-pay | Admitting: Behavioral Health

## 2015-10-24 NOTE — Telephone Encounter (Signed)
Pre-Visit Call completed with patient and chart updated.   Pre-Visit Info documented in Specialty Comments under SnapShot.    

## 2015-10-25 ENCOUNTER — Ambulatory Visit (INDEPENDENT_AMBULATORY_CARE_PROVIDER_SITE_OTHER): Payer: Medicare Other | Admitting: Family Medicine

## 2015-10-25 ENCOUNTER — Encounter: Payer: Self-pay | Admitting: Family Medicine

## 2015-10-25 VITALS — BP 141/80 | HR 95 | Temp 98.5°F | Ht 69.0 in | Wt 203.8 lb

## 2015-10-25 DIAGNOSIS — M545 Low back pain, unspecified: Secondary | ICD-10-CM

## 2015-10-25 DIAGNOSIS — Z0000 Encounter for general adult medical examination without abnormal findings: Secondary | ICD-10-CM | POA: Diagnosis not present

## 2015-10-25 DIAGNOSIS — Z136 Encounter for screening for cardiovascular disorders: Secondary | ICD-10-CM

## 2015-10-25 DIAGNOSIS — R21 Rash and other nonspecific skin eruption: Secondary | ICD-10-CM

## 2015-10-25 LAB — POCT URINALYSIS DIPSTICK
BILIRUBIN UA: NEGATIVE
GLUCOSE UA: NEGATIVE
KETONES UA: NEGATIVE
LEUKOCYTES UA: NEGATIVE
Nitrite, UA: NEGATIVE
PH UA: 6
Protein, UA: NEGATIVE
RBC UA: NEGATIVE
Spec Grav, UA: >=1.03
Urobilinogen, UA: 0.2

## 2015-10-25 MED ORDER — CLOTRIMAZOLE-BETAMETHASONE 1-0.05 % EX CREA: 1.0000 "application " | TOPICAL_CREAM | Freq: Two times a day (BID) | CUTANEOUS | Status: DC

## 2015-10-25 NOTE — Patient Instructions (Addendum)

## 2015-10-26 LAB — COMPREHENSIVE METABOLIC PANEL
ALT: 9 U/L (ref 0–53)
AST: 15 U/L (ref 0–37)
Albumin: 4.4 g/dL (ref 3.5–5.2)
Alkaline Phosphatase: 58 U/L (ref 39–117)
BUN: 14 mg/dL (ref 6–23)
CHLORIDE: 103 meq/L (ref 96–112)
CO2: 29 mEq/L (ref 19–32)
CREATININE: 0.82 mg/dL (ref 0.40–1.50)
Calcium: 9.4 mg/dL (ref 8.4–10.5)
GFR: 93.59 mL/min (ref >60.00)
Glucose, Bld: 84 mg/dL (ref 70–99)
Potassium: 3.9 mEq/L (ref 3.5–5.1)
SODIUM: 140 meq/L (ref 135–145)
Total Bilirubin: 0.8 mg/dL (ref 0.2–1.2)
Total Protein: 6.7 g/dL (ref 6.0–8.3)

## 2015-10-26 LAB — CBC WITH DIFFERENTIAL/PLATELET
Basophils Absolute: 0 10*3/uL (ref 0.0–0.1)
Basophils Relative: 0.4 % (ref 0.0–3.0)
Eosinophils Absolute: 0.3 10*3/uL (ref 0.0–0.7)
Eosinophils Relative: 3.9 % (ref 0.0–5.0)
HCT: 46.1 % (ref 39.0–52.0)
Hemoglobin: 15.6 g/dL (ref 13.0–17.0)
Lymphocytes Relative: 45.7 % (ref 12.0–46.0)
Lymphs Abs: 3.1 10*3/uL (ref 0.7–4.0)
MCHC: 33.9 g/dL (ref 30.0–36.0)
MCV: 101.3 fl — ABNORMAL HIGH (ref 78.0–100.0)
Monocytes Absolute: 0.4 10*3/uL (ref 0.1–1.0)
Monocytes Relative: 6.3 % (ref 3.0–12.0)
Neutro Abs: 3 10*3/uL (ref 1.4–7.7)
Neutrophils Relative %: 43.7 % (ref 43.0–77.0)
Platelets: 172 10*3/uL (ref 150.0–400.0)
RBC: 4.55 Mil/uL (ref 4.22–5.81)
RDW: 13.8 % (ref 11.5–15.5)
WBC: 6.9 10*3/uL (ref 4.0–10.5)

## 2015-10-26 LAB — LIPID PANEL
CHOLESTEROL: 170 mg/dL (ref 0–200)
HDL: 34.1 mg/dL — ABNORMAL LOW (ref >39.00)
LDL Cholesterol: 100 mg/dL — ABNORMAL HIGH (ref 0–99)
NonHDL: 136
TRIGLYCERIDES: 181 mg/dL — AB (ref 0.0–149.0)
Total CHOL/HDL Ratio: 5
VLDL: 36.2 mg/dL (ref 0.0–40.0)

## 2015-10-26 LAB — PSA: PSA: 0.76 ng/mL (ref 0.10–4.00)

## 2015-10-26 NOTE — Progress Notes (Signed)
Subjective:   Kevin Glass is a 82 y.o. male who presents for Medicare Annual/Subsequent preventive examination.  Review of Systems:   Review of Systems  Constitutional: Negative for activity change, appetite change and fatigue.  HENT: Negative for hearing loss, congestion, tinnitus and ear discharge.   Eyes: Negative for visual disturbance (see optho q1y -- vision corrected to 20/20 with glasses).  Respiratory: Negative for cough, chest tightness and shortness of breath.   Cardiovascular: Negative for chest pain, palpitations and leg swelling.  Gastrointestinal: Negative for abdominal pain, diarrhea, constipation and abdominal distention.  Genitourinary: Negative for urgency, frequency, decreased urine volume and difficulty urinating.  Musculoskeletal: Negative for back pain, arthralgias and gait problem.  Skin: Negative for color change, pallor and rash.  Neurological: Negative for dizziness, light-headedness, numbness and headaches.  Hematological: Negative for adenopathy. Does not bruise/bleed easily.  Psychiatric/Behavioral: Negative for suicidal ideas, confusion, sleep disturbance, self-injury, dysphoric mood, decreased concentration and agitation.  Pt is able to read and write and can do all ADLs No risk for falling No abuse/ violence in home          Objective:    BP 141/80 mmHg  Pulse 95  Temp(Src) 98.5 F (36.9 C) (Oral)  Ht 5\' 9"  (1.753 m)  Wt 203 lb 12.8 oz (92.443 kg)  BMI 30.08 kg/m2  SpO2 97% General appearance: alert, cooperative, appears stated age and no distress Head: Normocephalic, without obvious abnormality, atraumatic Eyes: conjunctivae/corneas clear. PERRL, EOM's intact. Fundi benign. Ears: normal TM's and external ear canals both ears Nose: Nares normal. Septum midline. Mucosa normal. No drainage or sinus tenderness. Throat: lips, mucosa, and tongue normal; teeth and gums normal Neck: no adenopathy, supple, symmetrical, trachea midline and  thyroid not enlarged, symmetric, no tenderness/mass/nodules Back: symmetric, no curvature. ROM normal. No CVA tenderness. Lungs: clear to auscultation bilaterally Chest wall: no tenderness--  Heart: S1, S2 normal Abdomen: soft, non-tender; bowel sounds normal; no masses,  no organomegaly Male genitalia: deferred Rectal: deferred Extremities: extremities normal, atraumatic, no cyanosis or edema Pulses: 2+ and symmetric Skin: + scaly area and errythema on upper abd Lymph nodes: Cervical, supraclavicular, and axillary nodes normal. Neurologic: Alert and oriented X 3, normal strength and tone. Normal symmetric reflexes. Normal coordination and gait Tobacco History  Smoking status  . Former Smoker  . Quit date: 06/17/1983  Smokeless tobacco  . Not on file     Counseling given: Not Answered   Past Medical History  Diagnosis Date  . Colonic polyp   . Arthritis   . Hemorrhoid   . BPH (benign prostatic hypertrophy)    Past Surgical History  Procedure Laterality Date  . Total knee arthroplasty  2003    Dr Maureen Ralphs  . Hemorrhoid surgery     Family History  Problem Relation Age of Onset  . Cancer Mother   . Arthritis Sister    History  Sexual Activity  . Sexual Activity:  . Partners: Female    Outpatient Encounter Prescriptions as of 10/25/2015  Medication Sig  . Acetaminophen (TYLENOL ARTHRITIS EXT RELIEF PO) Take by mouth every other day.  . Ascorbic Acid (VITAMIN C) 500 MG tablet Take 1,000 mg by mouth daily.   [name] aspirin 81 MG tablet Take 81 mg by mouth every other day.  . Cholecalciferol (VITAMIN D) 2000 UNITS CAPS Take 1 capsule by mouth daily.  . diphenhydramine-acetaminophen (TYLENOL PM) 25-500 MG TABS tablet Take 1 tablet by mouth at bedtime.  [name] lactulose (CHRONULAC) 10 GM/15ML  solution Take 30 cc's twice daily as needed for constipation  . Saw Palmetto 500 MG CAPS Take 1 capsule by mouth every other day.   . [DISCONTINUED] acetaminophen (TYLENOL) 500 MG tablet Take  500 mg by mouth every other day. For pain  . clotrimazole-betamethasone (LOTRISONE) cream Apply 1 application topically 2 (two) times daily.   No facility-administered encounter medications on file as of 10/25/2015.    Activities of Daily Living In your present state of health, do you have any difficulty performing the following activities: 10/25/2015 10/25/2015  Hearing? N N  Vision? N N  Difficulty concentrating or making decisions? N Y  Walking or climbing stairs? N N  Dressing or bathing? N N  Doing errands, shopping? N N    Patient Care Team: Ann Held, DO as PCP - General (Family Medicine) Inda Castle, MD as Consulting Physician (Gastroenterology) Mirna Mires, DDS as Consulting Physician (Dentistry)   Assessment:    CPE Exercise Activities and Dietary recommendations Current Exercise Habits: Structured exercise class, Type of exercise: walking;strength training/weights, Frequency (Times/Week): 7, Intensity: Mild, Exercise limited by: orthopedic condition(s)  Goals    None     Fall Risk Fall Risk  10/25/2015 10/25/2015 04/05/2014 05/27/2013 06/25/2012  Falls in the past year? No No No No No   Depression Screen PHQ 2/9 Scores 10/25/2015 10/25/2015 04/05/2014 05/27/2013  PHQ - 2 Score 0 0 0 0  Exception Documentation - Patient refusal - -    Cognitive Testing mmse 30/30  Immunization History  Administered Date(s) Administered  . Influenza,inj,Quad PF,36+ Mos 05/27/2013, 04/05/2014, 03/07/2015  . Pneumococcal Polysaccharide-23 01/18/2008  . Td 07/31/2004  . Tdap 07/31/2004  . Zoster 06/25/2005   Screening Tests Health Maintenance  Topic Date Due  . PNA vac Low Risk Adult (2 of 2 - PCV13) 01/17/2009  . TETANUS/TDAP  07/31/2014  . INFLUENZA VACCINE  01/15/2016  . ZOSTAVAX  Completed      Plan:    During the course of the visit the patient was educated and counseled about the following appropriate screening and preventive services:   Vaccines to  include Pneumoccal, Influenza, Hepatitis B, Td, Zostavax, HCV  Electrocardiogram  Cardiovascular Disease  Colorectal cancer screening  Diabetes screening  Prostate Cancer Screening  Glaucoma screening  Nutrition counseling   Smoking cessation counseling  Patient Instructions (the written plan) was given to the patient.   1. Midline low back pain without sciatica  - Ambulatory referral to Physical Therapy  2. Preventative health care ghm utd Check labs - Comprehensive metabolic panel - CBC with Differential/Platelet - Lipid panel - POCT urinalysis dipstick - PSA  3. Screening, ischemic heart disease   4. Rash and nonspecific skin eruption   - clotrimazole-betamethasone (LOTRISONE) cream; Apply 1 application topically 2 (two) times daily.  Dispense: 30 g; Refill: [place], DO  10/26/2015

## 2015-11-06 ENCOUNTER — Ambulatory Visit: Payer: Medicare Other | Attending: Family Medicine | Admitting: Physical Therapy

## 2015-11-06 DIAGNOSIS — M545 Low back pain, unspecified: Secondary | ICD-10-CM

## 2015-11-06 DIAGNOSIS — M546 Pain in thoracic spine: Secondary | ICD-10-CM | POA: Insufficient documentation

## 2015-11-06 NOTE — Therapy (Signed)
[facility] [address]  [place], [place], [address] Phone: [phone]   Fax:  [phone]  Physical Therapy Evaluation  Patient Details  Name: Kevin Glass MRN: [id number] Date of Birth: 11-Jul-1922 Referring Provider: Roma Schanz, MD  Encounter Date: 11/06/2015      PT End of Session - 11/06/15 1535    Visit Number 1   Number of Visits 16   Date for PT Re-Evaluation 01/15/16   PT Start Time [id number]   PT Stop Time 1535   PT Time Calculation (min) 50 min   Activity Tolerance Patient tolerated treatment well   Behavior During Therapy [facility] for tasks assessed/performed      Past Medical History  Diagnosis Date  . Colonic polyp   . Arthritis   . Hemorrhoid   . BPH (benign prostatic hypertrophy)     Past Surgical History  Procedure Laterality Date  . Total knee arthroplasty  2003    Dr Maureen Ralphs  . Hemorrhoid surgery      There were no vitals filed for this visit.       Subjective Assessment - 11/06/15 1448    Subjective Pt reports back pain beginning ~4-5 yrs ago. Saw PT and noted benefit, and was provided with HEP but has not done any of the exercises lately. About a year later, PCP prescribed a back brace designed to promote upright posture due to progressively flexed posture. Saw PCP for annual physical and was referred back to PT. Current activity includes walking dog daily and going to [employer] 2x/wk for cardio equip. Pt reports most discomfort upon rising from bed in the morning, but typically minimal pain during the day.   Diagnostic tests No recent imaging available, but as of 11/26/11 Thoracic & lumbar spine x-rays:  Osseous demineralization thoracic region, Multilevel degenerative disc and facet disease changes of the lumbar spine, Age indeterminate minimal anterior height loss at L2, Cannot exclude partial ankylosis of right SI joint.   Patient Stated Goals "To improve posture and reduce  back pain"   Currently in Pain? Yes   Pain Score 2   Least 0/10, Avg 6/10 upon rising in the morning, Worst 6/10   Pain Location Back   Pain Orientation Mid;Medial   Pain Descriptors / Indicators Sore;Discomfort   Pain Type Chronic pain   Pain Radiating Towards superiorly along spine   Pain Onset More than a month ago   Pain Frequency Intermittent   Aggravating Factors  rising from bed in the morning   Pain Relieving Factors copper belt, topical analgesia (Max-freeze)   Effect of Pain on Daily Activities limits sleep at night            [facility] PT Assessment - 11/06/15 1445    Assessment   Medical Diagnosis Midline low back pain   Referring Provider Roma Schanz, MD   Onset Date/Surgical Date --  4-5 yr h/o LBP   Next MD Visit PRN   Prior Therapy July 2013   Precautions   Required Braces or Orthoses Spinal Brace  not currently using   Balance Screen   Has the patient fallen in the past 6 months No   Has the patient had a decrease in activity level because of a fear of falling?  No   Is the patient reluctant to leave their home because of a fear of falling?  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, active in church   Observation/Other Assessments   Focus on Therapeutic Outcomes (FOTO)  Lumbar Spine - 57% (43% limitation); Predicted 64% (36% limitation)   Posture/Postural Control   Posture/Postural Control Postural limitations   Postural Limitations Rounded Shoulders;Forward head;Increased thoracic kyphosis;Decreased lumbar lordosis   ROM / Strength   AROM / PROM /  Strength Strength   Strength   Strength Assessment Site Hip   Right/Left Hip Right;Left   Right Hip Flexion 4-/5   Right Hip Extension 3/5   Right Hip ABduction 3+/5   Right Hip ADduction 4-/5   Left Hip Flexion 4-/5   Left Hip Extension 3/5   Left Hip ABduction 3+/5   Left Hip ADduction 4-/5   Flexibility   Soft Tissue Assessment /Muscle Length yes   Hamstrings B tightness   Quadriceps B mild tightness, primarily RF   Piriformis B tightness R>L         Today's Treatment  HEP instruction per Pt Instructions with pt performing return demonstration of at least 1 rep for each of stretches and 3-5 reps of lumbar stabilization exercises          PT Education - 11/06/15 1534    Education provided Yes   Education Details PT eval findings, POC, initial HEP and use of pillow between knees to promote neutral spine when sleeping on side   Person(s) Educated Patient   Methods Explanation;Demonstration;Handout   Comprehension Verbalized understanding;Returned demonstration;Need further instruction          PT Short Term Goals - 11/06/15 1536    PT SHORT TERM GOAL #1   Title Independent with initial HEP by 11/27/15   Status New           PT Long Term Goals - 11/06/15 1536    PT LONG TERM GOAL #1   Title Independent with advanced HEP & gym program by 01/15/16   Status New   PT LONG TERM GOAL #2   Title B LE strength 4/5 or greater by 01/15/16   Status New   PT LONG TERM GOAL #3   Title Pt will report no sleep disturbance due to back pain by 01/15/16   Status New   PT LONG TERM GOAL #4   Title Pt will demonstrate consistent awareness of improved upright posture by 01/15/16   Status New               Plan - 11/06/15 1535    Clinical Impression Statement Kevin Glass is a 82 y/o male who presents to OP PT with recent worsening midline upper lumbar/lower thoracic  low back pain with radiation upward along spine. Pt reports remote h/o LBP ~4 yrs ago for which he received PT  with good resolution of pain, and ~ 59yr later was provided with back brace by MD to promote low back support and upright posture. Pt has not completed any of prior exercises, nor worn back brace recently and now notes worsening of pain along progressively forward flexed posture of upper spine. Pain worst upon rising from bed in the morning, typically up to 6/10, but remains  milder during the day at ~2/10.  Pt demonstrates significantly forward flexed posture with forward head, rounded shoulders, increased thoracic kyphosis and decreased lumbar lordosis. Lumbar ROM decreased in all planes with moderate to severe proximal LE musculature tightness, especially in HS, piriformis and RF. Symmetrical weakness noted in B hips. POC will focus on improving LE soft tissue pliability along with core/proximal stability training, postural training, and manual therapy / modalities PRN for pain. Pt requesting to start with only 1x/wk due to insurance copay, therefore will plan for regular updates to HEP as appropriate, including training in use of gym equipment as pt currently goes to [employer] 2x/wk for cardio work-out.   Rehab Potential Good   Clinical Impairments Affecting Rehab Potential advanced age; h/o dizziness, RTC tendinopathy   PT Frequency 2x / week  1-2 x/wk (pt wanting to start with 1x/wk due to copay)   PT Duration 8 weeks   PT Treatment/Interventions Patient/family education;Therapeutic exercise;Manual techniques;Neuromuscular re-education;Therapeutic activities;Functional mobility training;ADLs/Self Care Home Management;Electrical Stimulation;Moist Heat;Cryotherapy;Ultrasound;Traction   PT Next Visit Plan Review of initial HEP with updates as appropriate; Postural training/strengthening; Progression of core stabilization for lumbar and thoracic spine; Training in use of appropriate gym equip; Manual therapy & modalities PRN   Consulted and Agree with Plan of Care Patient      Patient will benefit from  skilled therapeutic intervention in order to improve the following deficits and impairments:  Pain, Postural dysfunction, Impaired flexibility, Decreased range of motion, Decreased strength  Visit Diagnosis: Midline low back pain without sciatica  Pain in thoracic spine      G-Codes - November 28, 2015 10/06/08    Functional Assessment Tool Used Lumbar Spine FOTO - 57% (43% limitation)   Functional Limitation Changing and maintaining body position   Changing and Maintaining Body Position Current Status [id number]) At least 40 percent but less than 60 percent impaired, limited or restricted   Changing and Maintaining Body Position Goal Status [id number]) At least 20 percent but less than 40 percent impaired, limited or restricted       Problem List Patient Active Problem List   Diagnosis Date Noted  . Tendinopathy of rotator cuff 01/02/2015  . Right knee pain 09/21/2014  . Constipation 09/18/2014  . Wellness examination 04/05/2014  . Obesity (BMI 30-39.9) 05/27/2013  . URI (upper respiratory infection) 11/21/2010  . DIZZINESS AND GIDDINESS 12/18/2009  . NONSPECIFIC ABNORMAL ELECTROCARDIOGRAM 06/27/2009  . HEMORRHOIDS 08/26/2007  . DIVERTICULOSIS, COLON 08/26/2007  . ARTHRITIS 08/26/2007  . COLONIC POLYPS 05/04/2007  . IMPAIRED GLUCOSE TOLERANCE 02/23/2007  . 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

## 2015-11-14 ENCOUNTER — Ambulatory Visit: Payer: Medicare Other

## 2015-11-14 DIAGNOSIS — M545 Low back pain, unspecified: Secondary | ICD-10-CM

## 2015-11-14 DIAGNOSIS — M546 Pain in thoracic spine: Secondary | ICD-10-CM

## 2015-11-14 NOTE — Therapy (Addendum)
[facility] [address]  [place], [place], [address] Phone: [phone]   Fax:  [phone]  Physical Therapy Treatment  Patient Details  Name: Kevin Glass MRN: [id number] Date of Birth: Aug 05, 1922 Referring Provider: Roma Schanz, MD  Encounter Date: 11/14/2015      PT End of Session - 11/14/15 1513    Visit Number 2   Number of Visits 16   Date for PT Re-Evaluation 01/15/16   PT Start Time 0814   PT Stop Time 1535   PT Time Calculation (min) 43 min   Activity Tolerance Patient tolerated treatment well   Behavior During Therapy [facility] for tasks assessed/performed      Past Medical History  Diagnosis Date  . Colonic polyp   . Arthritis   . Hemorrhoid   . BPH (benign prostatic hypertrophy)     Past Surgical History  Procedure Laterality Date  . Total knee arthroplasty  2003    Dr Maureen Ralphs  . Hemorrhoid surgery      There were no vitals filed for this visit.      Subjective Assessment - 11/14/15 1458    Subjective Pt. reports he has to get up a few times each night due to LBP and bathroom needs.     Patient Stated Goals "To improve posture and reduce back pain"   Currently in Pain? Yes   Pain Score 4    Pain Location Back   Pain Orientation Mid   Pain Descriptors / Indicators Sore;Discomfort   Pain Type Chronic pain   Pain Onset More than a month ago   Pain Frequency Intermittent       Today's Treatment:  Therex (all therex performed in supine with 4 pillows and black bolster elevating back): NuStep: level 3, 4 min B HS, SKTC, PF stretch x 30 sec each Hooklying B hip abd/ER with black TB x 15 reps Hooklying bridging x 10 reps; limited ROM Hooklying alternating SLR with black TB around knees x 10 reps each side  Hooklying abdominal bracing with green TB around knees x 10 reps each   HEP review (1 rep of all for purpose of understanding): Hamstring stretch with towel x 5 sec   Single knee to chest stretch x 5 sec Piriformis stretch modified x 5 sec LTR abdominal bracing x 1  Hip adduction squeeze with ball x 1  Abdominal bracing with LE marching x 1 each         PT Short Term Goals - 11/14/15 1541    PT SHORT TERM GOAL #1   Title Independent with initial HEP by 11/27/15   Status On-going           PT Long Term Goals - 11/14/15 1541    PT LONG TERM GOAL #1   Title Independent with advanced HEP & gym program by 01/15/16   Status On-going   PT LONG TERM GOAL #2   Title B LE strength 4/5 or greater by 01/15/16   Status On-going   PT LONG TERM GOAL #3   Title Pt will report no sleep disturbance due to back pain by 01/15/16   Status On-going   PT LONG TERM GOAL #4   Title Pt will demonstrate consistent awareness of improved upright posture by 01/15/16   Status On-going               Plan - 11/14/15 1540    Clinical Impression Statement Pt. with 4/10  LBP today reporting he is sleeping pretty well at this point, walking dog twice a day, and attending gym twice a week at [employer].  Today's treatment focused on HEP review with introduction of more advanced lumbopelvic stability activities; pt. able to perform all HEP activities with good technique at this point bringing handout in the treatment today.  All therex performed with 4 pillows and black bolster under back for elevation.     PT Treatment/Interventions Patient/family education;Therapeutic exercise;Manual techniques;Neuromuscular re-education;Therapeutic activities;Functional mobility training;ADLs/Self Care Home Management;Electrical Stimulation;Moist Heat;Cryotherapy;Ultrasound;Traction   PT Next Visit Plan Postural training/strengthening; Progression of core stabilization for lumbar and thoracic spine; Training in use of appropriate gym equip; Manual therapy & modalities PRN      Patient will benefit from skilled therapeutic intervention in order to improve the following deficits and  impairments:  Pain, Postural dysfunction, Impaired flexibility, Decreased range of motion, Decreased strength  Visit Diagnosis: Midline low back pain without sciatica  Pain in thoracic spine     Problem List Patient Active Problem List   Diagnosis Date Noted  . Tendinopathy of rotator cuff 01/02/2015  . Right knee pain 09/21/2014  . Constipation 09/18/2014  . Wellness examination 04/05/2014  . Obesity (BMI 30-39.9) 05/27/2013  . URI (upper respiratory infection) 11/21/2010  . DIZZINESS AND GIDDINESS 12/18/2009  . NONSPECIFIC ABNORMAL ELECTROCARDIOGRAM 06/27/2009  . HEMORRHOIDS 08/26/2007  . DIVERTICULOSIS, COLON 08/26/2007  . ARTHRITIS 08/26/2007  . COLONIC POLYPS 05/04/2007  . IMPAIRED GLUCOSE TOLERANCE 02/23/2007  . 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   Remaining deficits:   Unable to assess secondary to failure to return to PT.   Education / Equipment:   HEP  Plan: Patient agrees to discharge.  Patient goals were not met. Patient is being discharged due to not returning since the last visit.  ?????        G-Codes - 11-Dec-2015 1540    Functional Assessment Tool Used Lumbar Spine FOTO - 57% (43% limitation)   Functional Limitation Changing and maintaining body position   Changing and Maintaining Body Position Goal Status (W4315) At least 20 percent but less than 40 percent impaired, limited or restricted   Changing and Maintaining Body Position Discharge Status  (Q0086) At least 40 percent but less than 60 percent impaired, limited or restricted            Percival Spanish, PT, MPT 12/20/2015, 4:51 PM  [facility] [address]  [place], [place], [address] Phone: [phone]   Fax:  [phone]

## 2015-11-20 ENCOUNTER — Ambulatory Visit: Payer: Medicare Other

## 2016-03-24 ENCOUNTER — Ambulatory Visit (INDEPENDENT_AMBULATORY_CARE_PROVIDER_SITE_OTHER): Payer: Medicare Other

## 2016-03-24 DIAGNOSIS — Z23 Encounter for immunization: Secondary | ICD-10-CM | POA: Diagnosis not present

## 2016-04-08 ENCOUNTER — Ambulatory Visit (INDEPENDENT_AMBULATORY_CARE_PROVIDER_SITE_OTHER): Payer: Medicare Other | Admitting: Family Medicine

## 2016-04-08 ENCOUNTER — Encounter: Payer: Self-pay | Admitting: Family Medicine

## 2016-04-08 VITALS — BP 115/75 | HR 75 | Temp 98.6°F | Ht 69.0 in | Wt 203.8 lb

## 2016-04-08 DIAGNOSIS — Z23 Encounter for immunization: Secondary | ICD-10-CM | POA: Diagnosis not present

## 2016-04-08 DIAGNOSIS — K5901 Slow transit constipation: Secondary | ICD-10-CM | POA: Diagnosis not present

## 2016-04-08 DIAGNOSIS — R1013 Epigastric pain: Secondary | ICD-10-CM

## 2016-04-08 MED ORDER — OMEPRAZOLE 20 MG PO CPDR: 20.0000 mg | DELAYED_RELEASE_CAPSULE | Freq: Every day | ORAL | 11 refills | Status: DC

## 2016-04-08 NOTE — Patient Instructions (Addendum)
Lactobacillus Oral formulations What is this medicine? LACTOBACILLUS (lak toh buh SIL uhs) is a supplement. It is used to help the normal balance of bacteria in the colon. This may treat or prevent diarrhea caused by an infection or by antibiotics. The FDA has not approved this supplement for any medical use. This supplement may be used for other purposes; ask your health care provider or pharmacist if you have questions. This medicine may be used for other purposes; ask your health care provider or pharmacist if you have questions. What should I tell my health care provider before I take this medicine? They need to know if you have any of these conditions: -chronic disease -immune system problems -prosthetic heart valve or valvular heart disease -an unusual or allergic reaction to Lactobacillus, any medicines, lactose or milk, other foods, dyes, or preservatives -pregnant or trying to get pregnant -breast-feeding How should I use this medicine? Take this medicine by mouth with a small amount of milk, fruit juice, or water. Follow the directions on the package labeling, or take as directed by your health care professional. This medicine can be taken with cereal or other food. Do not take this medicine more often than directed. Contact your pediatrician regarding the use of this medicine in children. Special care may be needed. This medicine is not recommended for children under 82 years old unless prescribed by a doctor. Overdosage: If you think you have taken too much of this medicine contact a poison control center or emergency room at once. NOTE: This medicine is only for you. Do not share this medicine with others. What if I miss a dose? If you miss a dose, take it as soon as you can. If it is almost time for your next dose, take only that dose. Do not take double or extra doses. What may interact with this medicine? Interactions are not expected. This list may not describe all possible  interactions. Give your health care provider a list of all the medicines, herbs, non-prescription drugs, or dietary supplements you use. Also tell them if you smoke, drink alcohol, or use illegal drugs. Some items may interact with your medicine. What should I watch for while using this medicine? See your doctor if your symptoms do not get better or if they get worse. Do not take this supplement for more than 2 days or if you have a fever unless your doctor tells you to. If you have allergies to milk or you are sensitive to lactose, do not use this supplement. What side effects may I notice from receiving this medicine? Side effects that you should report to your doctor or health care professional as soon as possible: -allergic reactions like skin rash, itching or hives, swelling of the face, lips, or tongue -breathing problems -severe nausea, vomiting -unusually weak or tired Side effects that usually do not require medical attention (report to your doctor or health care professional if they continue or are bothersome): -hiccups -stomach gas This list may not describe all possible side effects. Call your doctor for medical advice about side effects. You may report side effects to FDA at 1-800-FDA-1088. Where should I keep my medicine? Keep out of the reach of children. Store in the refrigerator or as directed on the package label. Do not freeze. Throw away any unused medicine after the expiration date. NOTE: This sheet is a summary. It may not cover all possible information. If you have questions about this medicine, talk to your doctor, pharmacist, or health  care provider.    2016, Elsevier/Gold Standard. (2011-05-30 07:41:13)  Food Choices for Gastroesophageal Reflux Disease, Adult When you have gastroesophageal reflux disease (GERD), the foods you eat and your eating habits are very important. Choosing the right foods can help ease the discomfort of GERD. WHAT GENERAL GUIDELINES DO I NEED  TO FOLLOW?  Choose fruits, vegetables, whole grains, low-fat dairy products, and low-fat meat, fish, and poultry.  Limit fats such as oils, salad dressings, butter, nuts, and avocado.  Keep a food diary to identify foods that cause symptoms.  Avoid foods that cause reflux. These may be different for different people.  Eat frequent small meals instead of three large meals each day.  Eat your meals slowly, in a relaxed setting.  Limit fried foods.  Cook foods using methods other than frying.  Avoid drinking alcohol.  Avoid drinking large amounts of liquids with your meals.  Avoid bending over or lying down until 2-3 hours after eating. WHAT FOODS ARE NOT RECOMMENDED? The following are some foods and drinks that may worsen your symptoms: Vegetables Tomatoes. Tomato juice. Tomato and spaghetti sauce. Chili peppers. Onion and garlic. Horseradish. Fruits Oranges, grapefruit, and lemon (fruit and juice). Meats High-fat meats, fish, and poultry. This includes hot dogs, ribs, ham, sausage, salami, and bacon. Dairy Whole milk and chocolate milk. Sour cream. Cream. Butter. Ice cream. Cream cheese.  Beverages Coffee and tea, with or without caffeine. Carbonated beverages or energy drinks. Condiments Hot sauce. Barbecue sauce.  Sweets/Desserts Chocolate and cocoa. Donuts. Peppermint and spearmint. Fats and Oils High-fat foods, including [place] fries and potato chips. Other Vinegar. Strong spices, such as black pepper, white pepper, red pepper, cayenne, curry powder, cloves, ginger, and chili powder. The items listed above may not be a complete list of foods and beverages to avoid. Contact your dietitian for more information.   This information is not intended to replace advice given to you by your health care provider. Make sure you discuss any questions you have with your health care provider.   Document Released: 06/02/2005 Document Revised: 06/23/2014 Document Reviewed:  04/06/2013 Elsevier Interactive Patient Education [employer].

## 2016-04-08 NOTE — Progress Notes (Signed)
Pre visit review using our clinic review tool, if applicable. No additional management support is needed unless otherwise documented below in the visit note. 

## 2016-04-08 NOTE — Progress Notes (Signed)
Patient ID: Kevin Glass, male    DOB: [birth year]  Age: 82 y.o. MRN: [id number]    Subjective:  Subjective  HPI Kevin Glass presents for digestive issues.  Pt has been alternating between loose stools and constipation.  He saw GI several months ago for constipation and he took lactulose and it cleaned him out.  He really has not had any trouble until Friday - sun-- he took lactulose bid and it did not seem to do anything.  So he went to pharmacy and got Mag citrate and took 1/2 bottle and it work.   Now pt feels a little unsettled but is not hurting.     Review of Systems  Constitutional: Negative for appetite change, diaphoresis, fatigue and unexpected weight change.  Eyes: Negative for pain, redness and visual disturbance.  Respiratory: Negative for cough, chest tightness, shortness of breath and wheezing.   Cardiovascular: Negative for chest pain, palpitations and leg swelling.  Gastrointestinal: Positive for constipation. Negative for abdominal distention and abdominal pain.  Endocrine: Negative for cold intolerance, heat intolerance, polydipsia, polyphagia and polyuria.  Genitourinary: Negative for difficulty urinating, dysuria and frequency.  Neurological: Negative for dizziness, light-headedness, numbness and headaches.    History Past Medical History:  Diagnosis Date  . Arthritis   . BPH (benign prostatic hypertrophy)   . Colonic polyp   . Hemorrhoid     He has a past surgical history that includes Total knee arthroplasty (2003) and Hemorrhoid surgery.   His family history includes Arthritis in his sister; Cancer in his mother.He reports that he quit smoking about 32 years ago. He does not have any smokeless tobacco history on file. He reports that he does not drink alcohol or use drugs.  Current Outpatient Prescriptions on File Prior to Visit  Medication Sig Dispense Refill  . Ascorbic Acid (VITAMIN C) 500 MG tablet Take 1,000 mg by mouth daily.     [name] aspirin 81 MG  tablet Take 81 mg by mouth every other day.    . Cholecalciferol (VITAMIN D) 2000 UNITS CAPS Take 1 capsule by mouth daily.    . clotrimazole-betamethasone (LOTRISONE) cream Apply 1 application topically 2 (two) times daily. 30 g 0  . diphenhydramine-acetaminophen (TYLENOL PM) 25-500 MG TABS tablet Take 1 tablet by mouth at bedtime.    [name] lactulose (CHRONULAC) 10 GM/15ML solution Take 30 cc's twice daily as needed for constipation 1800 mL 11  . Saw Palmetto 500 MG CAPS Take 1 capsule by mouth every other day.      No current facility-administered medications on file prior to visit.      Objective:  Objective  Physical Exam  Constitutional: He is oriented to person, place, and time. Vital signs are normal. He appears well-developed and well-nourished. He is sleeping.  HENT:  Head: Normocephalic and atraumatic.  Mouth/Throat: Oropharynx is clear and moist.  Eyes: EOM are normal. Pupils are equal, round, and reactive to light.  Neck: Normal range of motion. Neck supple. No thyromegaly present.  Cardiovascular: Normal rate and regular rhythm.   No murmur heard. Pulmonary/Chest: Effort normal and breath sounds normal. No respiratory distress. He has no wheezes. He has no rales. He exhibits no tenderness.  Abdominal: Soft. Bowel sounds are normal. He exhibits no distension. There is no tenderness. There is no rebound.  Musculoskeletal: He exhibits no edema or tenderness.  Neurological: He is alert and oriented to person, place, and time.  Skin: Skin is warm and dry.  Psychiatric: He has  a normal mood and affect. His behavior is normal. Judgment and thought content normal.  Nursing note and vitals reviewed.  BP 115/75 (BP Location: Left Arm, Patient Position: Sitting, Cuff Size: Normal)   Pulse 75   Temp 98.6 F (37 C) (Oral)   Ht 5\' 9"  (1.753 m)   Wt 203 lb 12.8 oz (92.4 kg)   SpO2 94%   BMI 30.10 kg/m  Wt Readings from Last 3 Encounters:  04/08/16 203 lb 12.8 oz (92.4 kg)  10/25/15  203 lb 12.8 oz (92.4 kg)  01/22/15 205 lb 6.4 oz (93.2 kg)     Lab Results  Component Value Date   WBC 6.9 10/25/2015   HGB 15.6 10/25/2015   HCT 46.1 10/25/2015   PLT 172.0 10/25/2015   GLUCOSE 84 10/25/2015   CHOL 170 10/25/2015   TRIG 181.0 (H) 10/25/2015   HDL 34.10 (L) 10/25/2015   LDLCALC 100 (H) 10/25/2015   ALT 9 10/25/2015   AST 15 10/25/2015   NA 140 10/25/2015   K 3.9 10/25/2015   CL 103 10/25/2015   CREATININE 0.82 10/25/2015   BUN 14 10/25/2015   CO2 29 10/25/2015   TSH 2.15 10/17/2014   PSA 0.76 10/25/2015   HGBA1C 5.8 06/29/2009    Dg Abd 1 View  Result Date: 10/05/2014 CLINICAL DATA:  Abdominal pain, constipation for 2 days EXAM: ABDOMEN - 1 VIEW COMPARISON:  10/01/2014 FINDINGS: Normal small bowel gas pattern. Abundant stool noted in right colon. Moderate gas noted in proximal sigmoid colon. Again noted degenerative changes lumbar spine. IMPRESSION: Normal small bowel gas pattern. Abundant colonic stool in right colon. Moderate gas in proximal sigmoid colon. Electronically Signed   By: Lahoma Crocker M.D.   On: 10/05/2014 08:33     Assessment & Plan:  Plan  I have discontinued Kevin Glass Acetaminophen (TYLENOL ARTHRITIS EXT RELIEF PO). I am also having him start on omeprazole. Additionally, I am having him maintain his vitamin C, Saw Palmetto, Vitamin D, aspirin, lactulose, diphenhydramine-acetaminophen, clotrimazole-betamethasone, and Fish Oil.  Meds ordered this encounter  Medications  . Omega-3 Fatty Acids (FISH OIL) 1200 MG CAPS    Sig: Take by mouth.  [name] omeprazole (PRILOSEC) 20 MG capsule    Sig: Take 1 capsule (20 mg total) by mouth daily.    Dispense:  30 capsule    Refill:  11    Problem List Items Addressed This Visit      Unprioritized   Constipation    Miralax, inc fiber in diet Probiotic daily F/u GI if no improvement---  He had a normal BM today      Dyspepsia - Primary    Start omeprazole gerd diet given to pt Consider GI f/u        Relevant Medications   omeprazole (PRILOSEC) 20 MG capsule    Other Visit Diagnoses    Need for pneumococcal vaccination       Relevant Orders   Pneumococcal conjugate vaccine 13-valent (Completed)      Follow-up: Return in about 6 months (around 10/07/2016) for annual exam, fasting.  Ann Held, DO

## 2016-04-09 DIAGNOSIS — R1013 Epigastric pain: Secondary | ICD-10-CM | POA: Insufficient documentation

## 2016-04-09 HISTORY — DX: Epigastric pain: R10.13

## 2016-04-09 NOTE — Assessment & Plan Note (Signed)
Miralax, inc fiber in diet Probiotic daily F/u GI if no improvement---  He had a normal BM today

## 2016-04-09 NOTE — Assessment & Plan Note (Signed)
Start omeprazole gerd diet given to pt Consider GI f/u

## 2016-07-30 DIAGNOSIS — Z96652 Presence of left artificial knee joint: Secondary | ICD-10-CM | POA: Diagnosis not present

## 2016-07-30 DIAGNOSIS — M1711 Unilateral primary osteoarthritis, right knee: Secondary | ICD-10-CM | POA: Diagnosis not present

## 2016-07-30 DIAGNOSIS — Z471 Aftercare following joint replacement surgery: Secondary | ICD-10-CM | POA: Diagnosis not present

## 2016-08-06 ENCOUNTER — Telehealth: Payer: Self-pay | Admitting: Family Medicine

## 2016-08-06 NOTE — Telephone Encounter (Addendum)
°  °  Caller Name: Mr. Gere  Relation to pt: spouse  Call back number: [phone]   Reason for call:  Patient inquiring if [place] healthcare directive is in good standing because she relocated to [facility] or does patient need to fill out another form, please advise

## 2016-08-07 NOTE — Telephone Encounter (Signed)
I dont know the answer to this

## 2016-08-07 NOTE — Telephone Encounter (Signed)
Patient informed. 

## 2016-10-06 DIAGNOSIS — D485 Neoplasm of uncertain behavior of skin: Secondary | ICD-10-CM | POA: Diagnosis not present

## 2016-10-06 DIAGNOSIS — D044 Carcinoma in situ of skin of scalp and neck: Secondary | ICD-10-CM | POA: Diagnosis not present

## 2016-11-18 DIAGNOSIS — C4442 Squamous cell carcinoma of skin of scalp and neck: Secondary | ICD-10-CM | POA: Diagnosis not present

## 2016-12-08 ENCOUNTER — Encounter: Payer: Self-pay | Admitting: Family Medicine

## 2016-12-08 ENCOUNTER — Ambulatory Visit (INDEPENDENT_AMBULATORY_CARE_PROVIDER_SITE_OTHER): Payer: Medicare Other | Admitting: Family Medicine

## 2016-12-08 VITALS — BP 116/70 | HR 83 | Temp 98.2°F | Resp 16 | Ht 69.0 in | Wt 203.0 lb

## 2016-12-08 DIAGNOSIS — Z0000 Encounter for general adult medical examination without abnormal findings: Secondary | ICD-10-CM

## 2016-12-08 DIAGNOSIS — N400 Enlarged prostate without lower urinary tract symptoms: Secondary | ICD-10-CM

## 2016-12-08 DIAGNOSIS — Z1211 Encounter for screening for malignant neoplasm of colon: Secondary | ICD-10-CM | POA: Diagnosis not present

## 2016-12-08 LAB — CBC WITH DIFFERENTIAL/PLATELET
BASOS PCT: 0.2 % (ref 0.0–3.0)
Basophils Absolute: 0 10*3/uL (ref 0.0–0.1)
EOS ABS: 0.2 10*3/uL (ref 0.0–0.7)
EOS PCT: 2.5 % (ref 0.0–5.0)
HCT: 46 % (ref 39.0–52.0)
HEMOGLOBIN: 15.9 g/dL (ref 13.0–17.0)
Lymphocytes Relative: 49.1 % — ABNORMAL HIGH (ref 12.0–46.0)
Lymphs Abs: 3.2 10*3/uL (ref 0.7–4.0)
MCHC: 34.6 g/dL (ref 30.0–36.0)
MCV: 103.9 fl — ABNORMAL HIGH (ref 78.0–100.0)
MONO ABS: 0.4 10*3/uL (ref 0.1–1.0)
Monocytes Relative: 6.7 % (ref 3.0–12.0)
NEUTROS ABS: 2.7 10*3/uL (ref 1.4–7.7)
Neutrophils Relative %: 41.5 % — ABNORMAL LOW (ref 43.0–77.0)
PLATELETS: 165 10*3/uL (ref 150.0–400.0)
RBC: 4.43 Mil/uL (ref 4.22–5.81)
RDW: 13.4 % (ref 11.5–15.5)
WBC: 6.5 10*3/uL (ref 4.0–10.5)

## 2016-12-08 LAB — COMPREHENSIVE METABOLIC PANEL
ALBUMIN: 4.4 g/dL (ref 3.5–5.2)
ALT: 9 U/L (ref 0–53)
AST: 13 U/L (ref 0–37)
Alkaline Phosphatase: 57 U/L (ref 39–117)
BUN: 17 mg/dL (ref 6–23)
CHLORIDE: 102 meq/L (ref 96–112)
CO2: 30 meq/L (ref 19–32)
CREATININE: 0.78 mg/dL (ref 0.40–1.50)
Calcium: 9.3 mg/dL (ref 8.4–10.5)
GFR: 98.9 mL/min (ref >60.00)
Glucose, Bld: 83 mg/dL (ref 70–99)
Potassium: 4.1 mEq/L (ref 3.5–5.1)
Sodium: 138 mEq/L (ref 135–145)
Total Bilirubin: 1 mg/dL (ref 0.2–1.2)
Total Protein: 6.8 g/dL (ref 6.0–8.3)

## 2016-12-08 LAB — POC URINALSYSI DIPSTICK (AUTOMATED)
BILIRUBIN UA: NEGATIVE
Glucose, UA: NEGATIVE
Ketones, UA: NEGATIVE
Leukocytes, UA: NEGATIVE
NITRITE UA: NEGATIVE
PH UA: 6 (ref 5.0–8.0)
Protein, UA: NEGATIVE
RBC UA: NEGATIVE
Spec Grav, UA: 1.02 (ref 1.010–1.025)
UROBILINOGEN UA: 0.2 U/dL

## 2016-12-08 LAB — LIPID PANEL
CHOL/HDL RATIO: 4
CHOLESTEROL: 170 mg/dL (ref 0–200)
HDL: 38.6 mg/dL — ABNORMAL LOW (ref >39.00)
LDL CALC: 108 mg/dL — AB (ref 0–99)
NonHDL: 131.3
TRIGLYCERIDES: 115 mg/dL (ref 0.0–149.0)
VLDL: 23 mg/dL (ref 0.0–40.0)

## 2016-12-08 LAB — PSA: PSA: 0.71 ng/mL (ref 0.10–4.00)

## 2016-12-08 NOTE — Assessment & Plan Note (Signed)
ghm utd Check labs D/w shingrix vaccine---  Will get at pharmacy See AVS

## 2016-12-08 NOTE — Patient Instructions (Signed)
Do not forget to get Shingles Vaccine and Tdap at [place] 65 Years and Older, Male Preventive care refers to lifestyle choices and visits with your health care provider that can promote health and wellness. What does preventive care include?  A yearly physical exam. This is also called an annual well check.  Dental exams once or twice a year.  Routine eye exams. Ask your health care provider how often you should have your eyes checked.  Personal lifestyle choices, including: ? Daily care of your teeth and gums. ? Regular physical activity. ? Eating a healthy diet. ? Avoiding tobacco and drug use. ? Limiting alcohol use. ? Practicing safe sex. ? Taking low doses of aspirin every day. ? Taking vitamin and mineral supplements as recommended by your health care provider. What happens during an annual well check? The services and screenings done by your health care provider during your annual well check will depend on your age, overall health, lifestyle risk factors, and family history of disease. Counseling Your health care provider may ask you questions about your:  Alcohol use.  Tobacco use.  Drug use.  Emotional well-being.  Home and relationship well-being.  Sexual activity.  Eating habits.  History of falls.  Memory and ability to understand (cognition).  Work and work Statistician.  Screening You may have the following tests or measurements:  Height, weight, and BMI.  Blood pressure.  Lipid and cholesterol levels. These may be checked every 5 years, or more frequently if you are over 53 years old.  Skin check.  Lung cancer screening. You may have this screening every year starting at age 39 if you have a 30-pack-year history of smoking and currently smoke or have quit within the past 15 years.  Fecal occult blood test (FOBT) of the stool. You may have this test every year starting at age 54.  Flexible sigmoidoscopy or colonoscopy. You  may have a sigmoidoscopy every 5 years or a colonoscopy every 10 years starting at age 67.  Prostate cancer screening. Recommendations will vary depending on your family history and other risks.  Hepatitis C blood test.  Hepatitis B blood test.  Sexually transmitted disease (STD) testing.  Diabetes screening. This is done by checking your blood sugar (glucose) after you have not eaten for a while (fasting). You may have this done every 1-3 years.  Abdominal aortic aneurysm (AAA) screening. You may need this if you are a current or former smoker.  Osteoporosis. You may be screened starting at age 68 if you are at high risk.  Talk with your health care provider about your test results, treatment options, and if necessary, the need for more tests. Vaccines Your health care provider may recommend certain vaccines, such as:  Influenza vaccine. This is recommended every year.  Tetanus, diphtheria, and acellular pertussis (Tdap, Td) vaccine. You may need a Td booster every 10 years.  Varicella vaccine. You may need this if you have not been vaccinated.  Zoster vaccine. You may need this after age 47.  Measles, mumps, and rubella (MMR) vaccine. You may need at least one dose of MMR if you were born in 1957 or later. You may also need a second dose.  Pneumococcal 13-valent conjugate (PCV13) vaccine. One dose is recommended after age 24.  Pneumococcal polysaccharide (PPSV23) vaccine. One dose is recommended after age 74.  Meningococcal vaccine. You may need this if you have certain conditions.  Hepatitis A vaccine. You may need this if you have  certain conditions or if you travel or work in places where you may be exposed to hepatitis A.  Hepatitis B vaccine. You may need this if you have certain conditions or if you travel or work in places where you may be exposed to hepatitis B.  Haemophilus influenzae type b (Hib) vaccine. You may need this if you have certain risk factors.  Talk  to your health care provider about which screenings and vaccines you need and how often you need them. This information is not intended to replace advice given to you by your health care provider. Make sure you discuss any questions you have with your health care provider. Document Released: 06/29/2015 Document Revised: 02/20/2016 Document Reviewed: 04/03/2015 Elsevier Interactive Patient Education  2017 Elsevier Inc.  

## 2016-12-08 NOTE — Assessment & Plan Note (Signed)
Pt prefers not to have prostate exam He wants just psa done

## 2016-12-08 NOTE — Progress Notes (Signed)
Patient ID: Kevin Glass, male   DOB: 04/19/1923, 83 y.o.   MRN: [id number]     Subjective:  I acted as a Education administrator for Dr. [name].  [name], [place]   Patient ID: Kevin Glass, male    DOB: 1923/06/07, 83 y.o.   MRN: [id number]  Chief Complaint  Patient presents with  . Annual Exam    HPI  Patient is in today for annual exam.  Has annual wellness visit at home from [facility].  No other problems.  Patient Care Team: [name], [name], DO as PCP - General (Family Medicine) Inda Castle, MD as Consulting Physician (Gastroenterology) Mirna Mires, DDS as Consulting Physician (Dentistry)   Past Medical History:  Diagnosis Date  . Arthritis   . BPH (benign prostatic hypertrophy)   . Colonic polyp   . Hemorrhoid     Past Surgical History:  Procedure Laterality Date  . HEMORRHOID SURGERY    . TOTAL KNEE ARTHROPLASTY  2003   Dr Maureen Ralphs    Family History  Problem Relation Age of Onset  . Cancer Mother   . Arthritis Sister     Social History   Social History  . Marital status: Married    Spouse name: N/A  . Number of children: N/A  . Years of education: N/A   Occupational History  . retired    Social History Main Topics  . Smoking status: Former Smoker    Quit date: 06/17/1983  . Smokeless tobacco: Never Used  . Alcohol use No  . Drug use: No  . Sexual activity: Yes    Partners: Female   Other Topics Concern  . Not on file   Social History Narrative   Exercise--[employer] twice a week and walks dog       Outpatient Medications Prior to Visit  Medication Sig Dispense Refill  . Ascorbic Acid (VITAMIN C) 500 MG tablet Take 1,000 mg by mouth daily.     [name] aspirin 81 MG tablet Take 81 mg by mouth every other day.    . Cholecalciferol (VITAMIN D) 2000 UNITS CAPS Take 1 capsule by mouth daily.    . diphenhydramine-acetaminophen (TYLENOL PM) 25-500 MG TABS tablet Take 1 tablet by mouth at bedtime.    [name] lactulose (CHRONULAC) 10 GM/15ML solution Take 30 cc's  twice daily as needed for constipation 1800 mL 11  . Omega-3 Fatty Acids (FISH OIL) 1200 MG CAPS Take by mouth.    . Saw Palmetto 500 MG CAPS Take 1 capsule by mouth every other day.     . clotrimazole-betamethasone (LOTRISONE) cream Apply 1 application topically 2 (two) times daily. 30 g 0  . omeprazole (PRILOSEC) 20 MG capsule Take 1 capsule (20 mg total) by mouth daily. 30 capsule 11   No facility-administered medications prior to visit.     Allergies  Allergen Reactions  . Penicillins Rash    White blister on the skin     Review of Systems  Constitutional: Negative for chills, fever and malaise/fatigue.  HENT: Negative for congestion and hearing loss.   Eyes: Negative for blurred vision and discharge.  Respiratory: Negative for cough, sputum production and shortness of breath.   Cardiovascular: Negative for chest pain, palpitations and leg swelling.  Gastrointestinal: Negative for abdominal pain, blood in stool, constipation, diarrhea, heartburn, nausea and vomiting.  Genitourinary: Negative for dysuria, frequency, hematuria and urgency.  Musculoskeletal: Negative for back pain, falls and myalgias.  Skin: Negative for rash.  Neurological: Negative for dizziness,  sensory change, loss of consciousness, weakness and headaches.  Endo/Heme/Allergies: Negative for environmental allergies. Does not bruise/bleed easily.  Psychiatric/Behavioral: Negative for depression and suicidal ideas. The patient is not nervous/anxious and does not have insomnia.        Objective:    Physical Exam  Constitutional: He is oriented to person, place, and time. He appears well-developed and well-nourished. No distress.  HENT:  Head: Normocephalic and atraumatic.  Right Ear: External ear normal.  Left Ear: External ear normal.  Nose: Nose normal.  Mouth/Throat: Oropharynx is clear and moist. No oropharyngeal exudate.  Eyes: Conjunctivae and EOM are normal. Pupils are equal, round, and reactive to  light. Right eye exhibits no discharge. Left eye exhibits no discharge.  Neck: Normal range of motion. Neck supple. No JVD present. No thyromegaly present.  Cardiovascular: Normal rate, regular rhythm and intact distal pulses.   No murmur heard. Pulmonary/Chest: Effort normal and breath sounds normal. No respiratory distress. He has no wheezes. He has no rales. He exhibits no tenderness.  Abdominal: Soft. Bowel sounds are normal. He exhibits no distension and no mass. There is no tenderness. There is no rebound and no guarding.  Musculoskeletal: Normal range of motion. He exhibits no edema or tenderness.  Lymphadenopathy:    He has no cervical adenopathy.  Neurological: He is alert and oriented to person, place, and time. He displays normal reflexes. No cranial nerve deficit. He exhibits normal muscle tone.  Skin: Skin is warm and dry. No rash noted. He is not diaphoretic. No erythema.  Psychiatric: He has a normal mood and affect. His behavior is normal. Judgment and thought content normal.  Nursing note and vitals reviewed.   BP 116/70 (BP Location: Right Arm, Cuff Size: Normal)   Pulse 83   Temp 98.2 F (36.8 C) (Oral)   Resp 16   Ht 5\' 9"  (1.753 m)   Wt 203 lb (92.1 kg)   SpO2 97%   BMI 29.98 kg/m  Wt Readings from Last 3 Encounters:  12/08/16 203 lb (92.1 kg)  04/08/16 203 lb 12.8 oz (92.4 kg)  10/25/15 203 lb 12.8 oz (92.4 kg)   BP Readings from Last 3 Encounters:  12/08/16 116/70  04/08/16 115/75  10/25/15 (!) 141/80     Immunization History  Administered Date(s) Administered  . Influenza, High Dose Seasonal PF 03/24/2016  . Influenza,inj,Quad PF,36+ Mos 05/27/2013, 04/05/2014, 03/07/2015  . Pneumococcal Conjugate-13 04/08/2016  . Pneumococcal Polysaccharide-23 01/18/2008  . Td 07/31/2004  . Tdap 07/31/2004  . Zoster 06/25/2005    Health Maintenance  Topic Date Due  . Samul Dada  07/31/2014  . INFLUENZA VACCINE  01/14/2017  . PNA vac Low Risk Adult   Completed    Lab Results  Component Value Date   WBC 6.5 12/08/2016   HGB 15.9 12/08/2016   HCT 46.0 12/08/2016   PLT 165.0 12/08/2016   GLUCOSE 83 12/08/2016   CHOL 170 12/08/2016   TRIG 115.0 12/08/2016   HDL 38.60 (L) 12/08/2016   LDLCALC 108 (H) 12/08/2016   ALT 9 12/08/2016   AST 13 12/08/2016   NA 138 12/08/2016   K 4.1 12/08/2016   CL 102 12/08/2016   CREATININE 0.78 12/08/2016   BUN 17 12/08/2016   CO2 30 12/08/2016   TSH 2.15 10/17/2014   PSA 0.71 12/08/2016   HGBA1C 5.8 06/29/2009    Lab Results  Component Value Date   TSH 2.15 10/17/2014   Lab Results  Component Value Date   WBC 6.5  12/08/2016   HGB 15.9 12/08/2016   HCT 46.0 12/08/2016   MCV 103.9 (H) 12/08/2016   PLT 165.0 12/08/2016   Lab Results  Component Value Date   NA 138 12/08/2016   K 4.1 12/08/2016   CO2 30 12/08/2016   GLUCOSE 83 12/08/2016   BUN 17 12/08/2016   CREATININE 0.78 12/08/2016   BILITOT 1.0 12/08/2016   ALKPHOS 57 12/08/2016   AST 13 12/08/2016   ALT 9 12/08/2016   PROT 6.8 12/08/2016   ALBUMIN 4.4 12/08/2016   CALCIUM 9.3 12/08/2016   GFR 98.90 12/08/2016   Lab Results  Component Value Date   CHOL 170 12/08/2016   Lab Results  Component Value Date   HDL 38.60 (L) 12/08/2016   Lab Results  Component Value Date   LDLCALC 108 (H) 12/08/2016   Lab Results  Component Value Date   TRIG 115.0 12/08/2016   Lab Results  Component Value Date   CHOLHDL 4 12/08/2016   Lab Results  Component Value Date   HGBA1C 5.8 06/29/2009         Assessment & Plan:   Problem List Items Addressed This Visit      Unprioritized   BPH (benign prostatic hyperplasia)    Pt prefers not to have prostate exam He wants just psa done      Preventative health care - Primary    ghm utd Check labs D/w shingrix vaccine---  Will get at pharmacy See AVS       Relevant Orders   Fecal occult blood, imunochemical    Other Visit Diagnoses    BPH without obstruction/lower  urinary tract symptoms       Relevant Orders   POCT Urinalysis Dipstick (Automated) (Completed)   CBC with Differential/Platelet (Completed)   Lipid panel (Completed)   Comprehensive metabolic panel (Completed)   PSA (Completed)   Colon cancer screening       Relevant Orders   Fecal occult blood, imunochemical      I have discontinued Mr. Kooi clotrimazole-betamethasone and omeprazole. I am also having him maintain his vitamin C, Saw Palmetto, Vitamin D, aspirin, lactulose, diphenhydramine-acetaminophen, and Fish Oil.  No orders of the defined types were placed in this encounter.   CMA served as Education administrator during this visit. History, Physical and Plan performed by medical provider. Documentation and orders reviewed and attested to.  Ann Held, DO

## 2016-12-15 ENCOUNTER — Other Ambulatory Visit (INDEPENDENT_AMBULATORY_CARE_PROVIDER_SITE_OTHER): Payer: Medicare Other

## 2016-12-15 DIAGNOSIS — Z0000 Encounter for general adult medical examination without abnormal findings: Secondary | ICD-10-CM | POA: Diagnosis not present

## 2016-12-15 DIAGNOSIS — Z1211 Encounter for screening for malignant neoplasm of colon: Secondary | ICD-10-CM

## 2016-12-15 LAB — FECAL OCCULT BLOOD, IMMUNOCHEMICAL: FECAL OCCULT BLD: NEGATIVE

## 2017-01-30 ENCOUNTER — Encounter: Payer: Self-pay | Admitting: Family Medicine

## 2017-01-30 ENCOUNTER — Other Ambulatory Visit: Payer: Self-pay | Admitting: Family Medicine

## 2017-01-30 MED ORDER — OMEPRAZOLE 20 MG PO CPDR: 20.0000 mg | DELAYED_RELEASE_CAPSULE | Freq: Every day | ORAL | 1 refills | Status: DC

## 2017-01-30 NOTE — Telephone Encounter (Signed)
Please send #[phone] omeprazole to [facility] with 3 refills

## 2017-03-02 DIAGNOSIS — L821 Other seborrheic keratosis: Secondary | ICD-10-CM | POA: Diagnosis not present

## 2017-03-02 DIAGNOSIS — L570 Actinic keratosis: Secondary | ICD-10-CM | POA: Diagnosis not present

## 2017-03-02 DIAGNOSIS — Z08 Encounter for follow-up examination after completed treatment for malignant neoplasm: Secondary | ICD-10-CM | POA: Diagnosis not present

## 2017-03-02 DIAGNOSIS — Z85828 Personal history of other malignant neoplasm of skin: Secondary | ICD-10-CM | POA: Diagnosis not present

## 2017-03-20 ENCOUNTER — Encounter: Payer: Self-pay | Admitting: Family Medicine

## 2017-03-26 ENCOUNTER — Encounter: Payer: Self-pay | Admitting: Family Medicine

## 2017-03-26 ENCOUNTER — Ambulatory Visit (INDEPENDENT_AMBULATORY_CARE_PROVIDER_SITE_OTHER): Payer: Medicare Other | Admitting: Family Medicine

## 2017-03-26 VITALS — BP 136/86 | HR 71 | Temp 98.3°F | Resp 16 | Ht 69.0 in | Wt 205.0 lb

## 2017-03-26 DIAGNOSIS — J324 Chronic pansinusitis: Secondary | ICD-10-CM

## 2017-03-26 MED ORDER — CLARITHROMYCIN ER 500 MG PO TB24: 1000.0000 mg | ORAL_TABLET | Freq: Every day | ORAL | 0 refills | Status: AC

## 2017-03-26 MED ORDER — LORATADINE 10 MG PO TABS: 10.0000 mg | ORAL_TABLET | Freq: Every day | ORAL | 11 refills | Status: DC | PRN

## 2017-03-26 MED ORDER — FLUTICASONE PROPIONATE 50 MCG/ACT NA SUSP: 2.0000 | Freq: Every day | NASAL | 6 refills | Status: DC

## 2017-03-26 NOTE — Patient Instructions (Signed)

## 2017-03-26 NOTE — Progress Notes (Signed)
Patient ID: HANDY MCLOUD, male    DOB: May 03, 1923  Age: 83 y.o. MRN: [id number]    Subjective:  Subjective  HPI EINAR NOLASCO presents for congestion and productive cough.   + sinus congestion / pressure. Symptoms x 10 days -- he is using otc with no relief-- nyquil, and cough drops.    Review of Systems  Constitutional: Positive for chills. Negative for fever.  HENT: Positive for congestion, postnasal drip, rhinorrhea and sinus pressure.   Respiratory: Positive for cough. Negative for chest tightness, shortness of breath and wheezing.   Cardiovascular: Negative for chest pain, palpitations and leg swelling.  Allergic/Immunologic: Negative for environmental allergies.    History Past Medical History:  Diagnosis Date  . Arthritis   . BPH (benign prostatic hypertrophy)   . Colonic polyp   . Hemorrhoid     He has a past surgical history that includes Total knee arthroplasty (2003) and Hemorrhoid surgery.   His family history includes Arthritis in his sister; Cancer in his mother.He reports that he quit smoking about 33 years ago. He has never used smokeless tobacco. He reports that he does not drink alcohol or use drugs.  Current Outpatient Prescriptions on File Prior to Visit  Medication Sig Dispense Refill  . Ascorbic Acid (VITAMIN C) 500 MG tablet Take 1,000 mg by mouth daily.     [name] aspirin 81 MG tablet Take 81 mg by mouth every other day.    . Cholecalciferol (VITAMIN D) 2000 UNITS CAPS Take 1 capsule by mouth daily.    . diphenhydramine-acetaminophen (TYLENOL PM) 25-500 MG TABS tablet Take 1 tablet by mouth at bedtime.    [name] lactulose (CHRONULAC) 10 GM/15ML solution Take 30 cc's twice daily as needed for constipation 1800 mL 11  . Omega-3 Fatty Acids (FISH OIL) 1200 MG CAPS Take by mouth.    [name] omeprazole (PRILOSEC) 20 MG capsule Take 1 capsule (20 mg total) by mouth daily. 90 capsule 1  . Saw Palmetto 500 MG CAPS Take 1 capsule by mouth every other day.      No current  facility-administered medications on file prior to visit.      Objective:  Objective  Physical Exam  Constitutional: He is oriented to person, place, and time. He appears well-developed and well-nourished.  HENT:  Right Ear: External ear normal.  Left Ear: External ear normal.  Nose: Mucosal edema and rhinorrhea present. Right sinus exhibits maxillary sinus tenderness and frontal sinus tenderness. Left sinus exhibits maxillary sinus tenderness and frontal sinus tenderness.  + PND + errythema  Eyes: Conjunctivae are normal. Right eye exhibits no discharge. Left eye exhibits no discharge.  Cardiovascular: Normal rate, regular rhythm and normal heart sounds.   No murmur heard. Pulmonary/Chest: Effort normal and breath sounds normal. No respiratory distress. He has no wheezes. He has no rales. He exhibits no tenderness.  Musculoskeletal: He exhibits no edema.  Lymphadenopathy:    He has cervical adenopathy.  Neurological: He is alert and oriented to person, place, and time.   BP 136/86 (BP Location: Left Arm, Patient Position: Sitting, Cuff Size: Normal)   Pulse 71   Temp 98.3 F (36.8 C) (Oral)   Resp 16   Ht 5\' 9"  (1.753 m)   Wt 205 lb (93 kg)   SpO2 96%   BMI 30.27 kg/m  Wt Readings from Last 3 Encounters:  03/26/17 205 lb (93 kg)  12/08/16 203 lb (92.1 kg)  04/08/16 203 lb 12.8 oz (92.4 kg)  Lab Results  Component Value Date   WBC 6.5 12/08/2016   HGB 15.9 12/08/2016   HCT 46.0 12/08/2016   PLT 165.0 12/08/2016   GLUCOSE 83 12/08/2016   CHOL 170 12/08/2016   TRIG 115.0 12/08/2016   HDL 38.60 (L) 12/08/2016   LDLCALC 108 (H) 12/08/2016   ALT 9 12/08/2016   AST 13 12/08/2016   NA 138 12/08/2016   K 4.1 12/08/2016   CL 102 12/08/2016   CREATININE 0.78 12/08/2016   BUN 17 12/08/2016   CO2 30 12/08/2016   TSH 2.15 10/17/2014   PSA 0.71 12/08/2016   HGBA1C 5.8 06/29/2009    Dg Abd 1 View  Result Date: 10/05/2014 CLINICAL DATA:  Abdominal pain,  constipation for 2 days EXAM: ABDOMEN - 1 VIEW COMPARISON:  10/01/2014 FINDINGS: Normal small bowel gas pattern. Abundant stool noted in right colon. Moderate gas noted in proximal sigmoid colon. Again noted degenerative changes lumbar spine. IMPRESSION: Normal small bowel gas pattern. Abundant colonic stool in right colon. Moderate gas in proximal sigmoid colon. Electronically Signed   By: Lahoma Crocker M.D.   On: 10/05/2014 08:33     Assessment & Plan:  Plan  I am having Mr. Borum start on clarithromycin, fluticasone, and loratadine. I am also having him maintain his vitamin C, Saw Palmetto, Vitamin D, aspirin, lactulose, diphenhydramine-acetaminophen, Fish Oil, and omeprazole.  Meds ordered this encounter  Medications  . clarithromycin (BIAXIN XL) 500 MG 24 hr tablet    Sig: Take 2 tablets (1,000 mg total) by mouth daily.    Dispense:  20 tablet    Refill:  0  . fluticasone (FLONASE) 50 MCG/ACT nasal spray    Sig: Place 2 sprays into both nostrils daily.    Dispense:  16 g    Refill:  6  . loratadine (CLARITIN) 10 MG tablet    Sig: Take 1 tablet (10 mg total) by mouth daily as needed for allergies.    Dispense:  30 tablet    Refill:  11    Problem List Items Addressed This Visit    None    Visit Diagnoses    Pansinusitis, unspecified chronicity    -  Primary   Relevant Medications   clarithromycin (BIAXIN XL) 500 MG 24 hr tablet   fluticasone (FLONASE) 50 MCG/ACT nasal spray   loratadine (CLARITIN) 10 MG tablet    can still use nyquil at night prn Call or rto prn   Follow-up: Return if symptoms worsen or fail to improve.  Ann Held, DO

## 2017-04-01 ENCOUNTER — Ambulatory Visit: Payer: Medicare Other

## 2017-04-10 ENCOUNTER — Ambulatory Visit (INDEPENDENT_AMBULATORY_CARE_PROVIDER_SITE_OTHER): Payer: Medicare Other | Admitting: Behavioral Health

## 2017-04-10 DIAGNOSIS — Z23 Encounter for immunization: Secondary | ICD-10-CM | POA: Diagnosis not present

## 2017-04-10 NOTE — Progress Notes (Signed)
Pre visit review using our clinic review tool, if applicable. No additional management support is needed unless otherwise documented below in the visit note.  Patient came in clinic for influenza vaccination. IM injection was given in the left deltoid. Patient tolerated the injection well. 

## 2017-04-17 ENCOUNTER — Encounter: Payer: Self-pay | Admitting: Family Medicine

## 2017-04-20 ENCOUNTER — Encounter: Payer: Self-pay | Admitting: Family Medicine

## 2017-04-20 MED ORDER — LACTULOSE 10 GM/15ML PO SOLN: 20.0000 g | Freq: Two times a day (BID) | ORAL | 1 refills | Status: DC | PRN

## 2017-06-18 ENCOUNTER — Ambulatory Visit (INDEPENDENT_AMBULATORY_CARE_PROVIDER_SITE_OTHER): Payer: Medicare Other | Admitting: Family Medicine

## 2017-06-18 ENCOUNTER — Encounter: Payer: Self-pay | Admitting: Family Medicine

## 2017-06-18 VITALS — BP 132/78 | HR 92 | Temp 98.4°F | Resp 16 | Ht 69.0 in | Wt 207.4 lb

## 2017-06-18 DIAGNOSIS — J324 Chronic pansinusitis: Secondary | ICD-10-CM | POA: Diagnosis not present

## 2017-06-18 DIAGNOSIS — J069 Acute upper respiratory infection, unspecified: Secondary | ICD-10-CM | POA: Diagnosis not present

## 2017-06-18 DIAGNOSIS — R059 Cough, unspecified: Secondary | ICD-10-CM

## 2017-06-18 DIAGNOSIS — R05 Cough: Secondary | ICD-10-CM | POA: Diagnosis not present

## 2017-06-18 MED ORDER — CLARITHROMYCIN ER 500 MG PO TB24: 1000.0000 mg | ORAL_TABLET | Freq: Every day | ORAL | 0 refills | Status: AC

## 2017-06-18 MED ORDER — LEVOCETIRIZINE DIHYDROCHLORIDE 5 MG PO TABS: 5.0000 mg | ORAL_TABLET | Freq: Every evening | ORAL | 5 refills | Status: DC

## 2017-06-18 MED ORDER — PROMETHAZINE-DM 6.25-15 MG/5ML PO SYRP: 5.0000 mL | ORAL_SOLUTION | Freq: Four times a day (QID) | ORAL | 0 refills | Status: DC | PRN

## 2017-06-18 NOTE — Patient Instructions (Signed)

## 2017-06-18 NOTE — Progress Notes (Signed)
Patient ID: Kevin Glass, male   DOB: February 22, 1923, 84 y.o.   MRN: [id number]     Subjective:  I acted as a Education administrator for Dr. [name].  [name], [place]   Patient ID: Kevin Glass, male    DOB: 04-08-1923, 84 y.o.   MRN: [id number]  Chief Complaint  Patient presents with  . Sinusitis    Sinusitis  This is a new problem. Episode onset: 2 weeks. There has been no fever. Associated symptoms include congestion, coughing, sinus pressure and a sore throat. Pertinent negatives include no chills, headaches or shortness of breath. Past treatments include spray decongestants (claritin, cough drops). The treatment provided mild relief.   no fevers, chills.  + sinus pressure.   Patient is in today for sinus issues.  Patient Care Team: [name], [name], DO as PCP - General (Family Medicine) Inda Castle, MD (Inactive) as Consulting Physician (Gastroenterology) Mirna Mires, DDS as Consulting Physician (Dentistry)   Past Medical History:  Diagnosis Date  . Arthritis   . BPH (benign prostatic hypertrophy)   . Colonic polyp   . Hemorrhoid     Past Surgical History:  Procedure Laterality Date  . HEMORRHOID SURGERY    . TOTAL KNEE ARTHROPLASTY  2003   Dr Maureen Ralphs    Family History  Problem Relation Age of Onset  . Cancer Mother   . Arthritis Sister     Social History   Socioeconomic History  . Marital status: Married    Spouse name: Not on file  . Number of children: Not on file  . Years of education: Not on file  . Highest education level: Not on file  Social Needs  . Financial resource strain: Not on file  . Food insecurity - worry: Not on file  . Food insecurity - inability: Not on file  . Transportation needs - medical: Not on file  . Transportation needs - non-medical: Not on file  Occupational History  . Occupation: retired  Tobacco Use  . Smoking status: Former Smoker    Last attempt to quit: 06/17/1983    Years since quitting: 34.0  . Smokeless tobacco:  Never Used  Substance and Sexual Activity  . Alcohol use: No    Alcohol/week: 0.0 oz  . Drug use: No  . Sexual activity: Yes    Partners: Female  Other Topics Concern  . Not on file  Social History Narrative   Exercise--[employer] twice a week and walks dog    Outpatient Medications Prior to Visit  Medication Sig Dispense Refill  . Ascorbic Acid (VITAMIN C) 500 MG tablet Take 1,000 mg by mouth daily.     [name] aspirin 81 MG tablet Take 81 mg by mouth every other day.    . Cholecalciferol (VITAMIN D) 2000 UNITS CAPS Take 1 capsule by mouth daily.    . diphenhydramine-acetaminophen (TYLENOL PM) 25-500 MG TABS tablet Take 1 tablet by mouth at bedtime.    . fluticasone (FLONASE) 50 MCG/ACT nasal spray Place 2 sprays into both nostrils daily. 16 g 6  . lactulose (CHRONULAC) 10 GM/15ML solution Take 30 mLs (20 g total) 2 (two) times daily as needed by mouth for moderate constipation. 1800 mL 1  . Omega-3 Fatty Acids (FISH OIL) 1200 MG CAPS Take by mouth.    [name] omeprazole (PRILOSEC) 20 MG capsule Take 1 capsule (20 mg total) by mouth daily. 90 capsule 1  . Saw Palmetto 500 MG CAPS Take 1 capsule by mouth every other day.     [name]  loratadine (CLARITIN) 10 MG tablet Take 1 tablet (10 mg total) by mouth daily as needed for allergies. 30 tablet 11   No facility-administered medications prior to visit.     Allergies  Allergen Reactions  . Penicillins Rash    White blister on the skin     Review of Systems  Constitutional: Negative for chills, fever and malaise/fatigue.  HENT: Positive for congestion, sinus pressure, sinus pain and sore throat.   Eyes: Negative for blurred vision.  Respiratory: Positive for cough and sputum production. Negative for shortness of breath and wheezing.   Cardiovascular: Negative for chest pain, palpitations and leg swelling.  Gastrointestinal: Negative for vomiting.  Musculoskeletal: Negative for back pain.  Skin: Negative for rash.  Neurological: Negative for  loss of consciousness and headaches.       Objective:    Physical Exam  Constitutional: He is oriented to person, place, and time. He appears well-developed and well-nourished.  HENT:  Right Ear: External ear normal.  Left Ear: External ear normal.  Nose: Right sinus exhibits maxillary sinus tenderness and frontal sinus tenderness. Left sinus exhibits maxillary sinus tenderness and frontal sinus tenderness.  Mouth/Throat: No oropharyngeal exudate, posterior oropharyngeal edema or posterior oropharyngeal erythema.  + PND + errythema  Eyes: Conjunctivae are normal. Right eye exhibits no discharge. Left eye exhibits no discharge.  Cardiovascular: Normal rate, regular rhythm and normal heart sounds.  No murmur heard. Pulmonary/Chest: Effort normal and breath sounds normal. No respiratory distress. He has no wheezes. He has no rales. He exhibits no tenderness.  Musculoskeletal: He exhibits no edema.  Lymphadenopathy:    He has cervical adenopathy.  Neurological: He is alert and oriented to person, place, and time.  Nursing note and vitals reviewed.   BP 132/78 (BP Location: Right Arm, Cuff Size: Normal)   Pulse 92   Temp 98.4 F (36.9 C) (Oral)   Resp 16   Ht 5\' 9"  (1.753 m)   Wt 207 lb 6.4 oz (94.1 kg)   SpO2 95%   BMI 30.63 kg/m  Wt Readings from Last 3 Encounters:  06/18/17 207 lb 6.4 oz (94.1 kg)  03/26/17 205 lb (93 kg)  12/08/16 203 lb (92.1 kg)   BP Readings from Last 3 Encounters:  06/18/17 132/78  03/26/17 136/86  12/08/16 116/70     Immunization History  Administered Date(s) Administered  . Influenza, High Dose Seasonal PF 03/24/2016, 04/10/2017  . Influenza,inj,Quad PF,6+ Mos 05/27/2013, 04/05/2014, 03/07/2015  . Pneumococcal Conjugate-13 04/08/2016  . Pneumococcal Polysaccharide-23 01/18/2008  . Td 07/31/2004  . Tdap 07/31/2004  . Zoster 06/25/2005    Health Maintenance  Topic Date Due  . Samul Dada  07/31/2014  . INFLUENZA VACCINE  Completed  .  PNA vac Low Risk Adult  Completed    Lab Results  Component Value Date   WBC 6.5 12/08/2016   HGB 15.9 12/08/2016   HCT 46.0 12/08/2016   PLT 165.0 12/08/2016   GLUCOSE 83 12/08/2016   CHOL 170 12/08/2016   TRIG 115.0 12/08/2016   HDL 38.60 (L) 12/08/2016   LDLCALC 108 (H) 12/08/2016   ALT 9 12/08/2016   AST 13 12/08/2016   NA 138 12/08/2016   K 4.1 12/08/2016   CL 102 12/08/2016   CREATININE 0.78 12/08/2016   BUN 17 12/08/2016   CO2 30 12/08/2016   TSH 2.15 10/17/2014   PSA 0.71 12/08/2016   HGBA1C 5.8 06/29/2009    Lab Results  Component Value Date   TSH 2.15 10/17/2014  Lab Results  Component Value Date   WBC 6.5 12/08/2016   HGB 15.9 12/08/2016   HCT 46.0 12/08/2016   MCV 103.9 (H) 12/08/2016   PLT 165.0 12/08/2016   Lab Results  Component Value Date   NA 138 12/08/2016   K 4.1 12/08/2016   CO2 30 12/08/2016   GLUCOSE 83 12/08/2016   BUN 17 12/08/2016   CREATININE 0.78 12/08/2016   BILITOT 1.0 12/08/2016   ALKPHOS 57 12/08/2016   AST 13 12/08/2016   ALT 9 12/08/2016   PROT 6.8 12/08/2016   ALBUMIN 4.4 12/08/2016   CALCIUM 9.3 12/08/2016   GFR 98.90 12/08/2016   Lab Results  Component Value Date   CHOL 170 12/08/2016   Lab Results  Component Value Date   HDL 38.60 (L) 12/08/2016   Lab Results  Component Value Date   LDLCALC 108 (H) 12/08/2016   Lab Results  Component Value Date   TRIG 115.0 12/08/2016   Lab Results  Component Value Date   CHOLHDL 4 12/08/2016   Lab Results  Component Value Date   HGBA1C 5.8 06/29/2009         Assessment & Plan:   Problem List Items Addressed This Visit      Unprioritized   URI (upper respiratory infection) - Primary   Relevant Medications   levocetirizine (XYZAL) 5 MG tablet   clarithromycin (BIAXIN XL) 500 MG 24 hr tablet    Other Visit Diagnoses    Pansinusitis, unspecified chronicity       Relevant Medications   levocetirizine (XYZAL) 5 MG tablet   clarithromycin (BIAXIN XL) 500  MG 24 hr tablet   promethazine-dextromethorphan (PROMETHAZINE-DM) 6.25-15 MG/5ML syrup   Cough       Relevant Medications   promethazine-dextromethorphan (PROMETHAZINE-DM) 6.25-15 MG/5ML syrup      I have discontinued Denzil Magnuson "Stan"'s loratadine. I am also having him start on levocetirizine, clarithromycin, and promethazine-dextromethorphan. Additionally, I am having him maintain his vitamin C, Saw Palmetto, Vitamin D, aspirin, diphenhydramine-acetaminophen, Fish Oil, omeprazole, fluticasone, and lactulose.  Meds ordered this encounter  Medications  . levocetirizine (XYZAL) 5 MG tablet    Sig: Take 1 tablet (5 mg total) by mouth every evening.    Dispense:  30 tablet    Refill:  5  . clarithromycin (BIAXIN XL) 500 MG 24 hr tablet    Sig: Take 2 tablets (1,000 mg total) by mouth daily for 10 days.    Dispense:  20 tablet    Refill:  0  . promethazine-dextromethorphan (PROMETHAZINE-DM) 6.25-15 MG/5ML syrup    Sig: Take 5 mLs by mouth 4 (four) times daily as needed.    Dispense:  118 mL    Refill:  0    CMA served as scribe during this visit. History, Physical and Plan performed by medical provider. Documentation and orders reviewed and attested to.  Ann Held, DO f

## 2017-06-22 ENCOUNTER — Other Ambulatory Visit: Payer: Self-pay | Admitting: Family Medicine

## 2017-06-22 DIAGNOSIS — R05 Cough: Secondary | ICD-10-CM

## 2017-06-22 DIAGNOSIS — R059 Cough, unspecified: Secondary | ICD-10-CM

## 2017-07-11 ENCOUNTER — Other Ambulatory Visit: Payer: Self-pay | Admitting: Family Medicine

## 2017-10-12 ENCOUNTER — Encounter: Payer: Self-pay | Admitting: Family Medicine

## 2017-10-12 ENCOUNTER — Ambulatory Visit (INDEPENDENT_AMBULATORY_CARE_PROVIDER_SITE_OTHER): Payer: Medicare Other | Admitting: Family Medicine

## 2017-10-12 VITALS — BP 130/68 | HR 42 | Temp 98.0°F | Resp 16 | Ht 69.0 in | Wt 203.0 lb

## 2017-10-12 DIAGNOSIS — J029 Acute pharyngitis, unspecified: Secondary | ICD-10-CM

## 2017-10-12 DIAGNOSIS — J40 Bronchitis, not specified as acute or chronic: Secondary | ICD-10-CM

## 2017-10-12 DIAGNOSIS — J324 Chronic pansinusitis: Secondary | ICD-10-CM | POA: Diagnosis not present

## 2017-10-12 MED ORDER — PROMETHAZINE-DM 6.25-15 MG/5ML PO SYRP: 5.0000 mL | ORAL_SOLUTION | Freq: Four times a day (QID) | ORAL | 0 refills | Status: DC | PRN

## 2017-10-12 MED ORDER — AZITHROMYCIN 250 MG PO TABS: ORAL_TABLET | ORAL | 0 refills | Status: DC

## 2017-10-12 MED ORDER — FLUTICASONE PROPIONATE 50 MCG/ACT NA SUSP: 2.0000 | Freq: Every day | NASAL | 6 refills | Status: DC

## 2017-10-12 NOTE — Progress Notes (Signed)
popPatient ID: Kevin Glass, male   DOB: 1922-09-13, 84 y.o.   MRN: [id number]     Subjective:  I acted as a Education administrator for Dr. [name].  Kevin Glass, Kevin Glass   Patient ID: Kevin Glass, male    DOB: [birth year], 84 y.o.   MRN: [id number]  Chief Complaint  Patient presents with  . Cough    Cough  This is a new problem. Episode onset: thursday. The cough is productive of sputum. Associated symptoms include postnasal drip and a sore throat. Pertinent negatives include no chills, ear congestion, ear pain or nasal congestion. He has tried nothing for the symptoms.    Patient is in today for cough.   Patient Care Team: [name], [name], DO as PCP - General (Family Medicine) Inda Castle, MD (Inactive) as Consulting Physician (Gastroenterology) Mirna Mires, DDS as Consulting Physician (Dentistry)   Past Medical History:  Diagnosis Date  . Arthritis   . BPH (benign prostatic hypertrophy)   . Colonic polyp   . Hemorrhoid     Past Surgical History:  Procedure Laterality Date  . HEMORRHOID SURGERY    . TOTAL KNEE ARTHROPLASTY  2003   Dr Maureen Ralphs    Family History  Problem Relation Age of Onset  . Cancer Mother   . Arthritis Sister     Social History   Socioeconomic History  . Marital status: Married    Spouse name: Not on file  . Number of children: Not on file  . Years of education: Not on file  . Highest education level: Not on file  Occupational History  . Occupation: retired  Scientific laboratory technician  . Financial resource strain: Not on file  . Food insecurity:    Worry: Not on file    Inability: Not on file  . Transportation needs:    Medical: Not on file    Non-medical: Not on file  Tobacco Use  . Smoking status: Former Smoker    Last attempt to quit: 06/17/1983    Years since quitting: 34.3  . Smokeless tobacco: Never Used  Substance and Sexual Activity  . Alcohol use: No    Alcohol/week: 0.0 oz  . Drug use: No  . Sexual activity: Yes    Partners: Female    Lifestyle  . Physical activity:    Days per week: Not on file    Minutes per session: Not on file  . Stress: Not on file  Relationships  . Social connections:    Talks on phone: Not on file    Gets together: Not on file    Attends religious service: Not on file    Active member of club or organization: Not on file    Attends meetings of clubs or organizations: Not on file    Relationship status: Not on file  . Intimate partner violence:    Fear of current or ex partner: Not on file    Emotionally abused: Not on file    Physically abused: Not on file    Forced sexual activity: Not on file  Other Topics Concern  . Not on file  Social History Narrative   Exercise--[employer] twice a week and walks dog    Outpatient Medications Prior to Visit  Medication Sig Dispense Refill  . Ascorbic Acid (VITAMIN C) 500 MG tablet Take 1,000 mg by mouth daily.     [name] aspirin 81 MG tablet Take 81 mg by mouth every other day.    . Cholecalciferol (VITAMIN D)  2000 UNITS CAPS Take 1 capsule by mouth daily.    . diphenhydramine-acetaminophen (TYLENOL PM) 25-500 MG TABS tablet Take 1 tablet by mouth at bedtime.    . fluticasone (FLONASE) 50 MCG/ACT nasal spray Place 2 sprays into both nostrils daily. 16 g 6  . lactulose (CHRONULAC) 10 GM/15ML solution Take 30 mLs (20 g total) 2 (two) times daily as needed by mouth for moderate constipation. 1800 mL 1  . levocetirizine (XYZAL) 5 MG tablet Take 1 tablet (5 mg total) by mouth every evening. 30 tablet 5  . Omega-3 Fatty Acids (FISH OIL) 1200 MG CAPS Take by mouth.    [name] omeprazole (PRILOSEC) 20 MG capsule TAKE 1 CAPSULE BY MOUTH  DAILY 90 capsule 1  . Saw Palmetto 500 MG CAPS Take 1 capsule by mouth every other day.     . promethazine-dextromethorphan (PROMETHAZINE-DM) 6.25-15 MG/5ML syrup Take 5 mLs by mouth 4 (four) times daily as needed. 118 mL 0   No facility-administered medications prior to visit.     Allergies  Allergen Reactions  . Penicillins  Rash    White blister on the skin     Review of Systems  Constitutional: Negative for chills.  HENT: Positive for postnasal drip and sore throat. Negative for ear pain.   Respiratory: Positive for cough.        Objective:    Physical Exam  Constitutional: He is oriented to person, place, and time. He appears well-developed and well-nourished.  HENT:  Right Ear: External ear normal.  Left Ear: External ear normal.  Nose: Rhinorrhea present. Right sinus exhibits frontal sinus tenderness. Left sinus exhibits frontal sinus tenderness.  + PND + errythema  Eyes: Conjunctivae are normal. Right eye exhibits no discharge. Left eye exhibits no discharge.  Cardiovascular: Normal rate, regular rhythm and normal heart sounds.  No murmur heard. Pulmonary/Chest: Effort normal. No respiratory distress. He has no wheezes. He has rhonchi in the left upper field and the left middle field. He has no rales. He exhibits no tenderness.  Musculoskeletal: He exhibits no edema.  Lymphadenopathy:    He has cervical adenopathy.  Neurological: He is alert and oriented to person, place, and time.  Nursing note and vitals reviewed.   BP 130/68 (BP Location: Right Arm, Cuff Size: Normal)   Pulse (!) 42   Temp 98 F (36.7 C) (Oral)   Resp 16   Ht 5\' 9"  (1.753 m)   Wt 203 lb (92.1 kg)   SpO2 94%   BMI 29.98 kg/m  Wt Readings from Last 3 Encounters:  10/12/17 203 lb (92.1 kg)  06/18/17 207 lb 6.4 oz (94.1 kg)  03/26/17 205 lb (93 kg)   BP Readings from Last 3 Encounters:  10/12/17 130/68  06/18/17 132/78  03/26/17 136/86     Immunization History  Administered Date(s) Administered  . Influenza, High Dose Seasonal PF 03/24/2016, 04/10/2017  . Influenza,inj,Quad PF,6+ Mos 05/27/2013, 04/05/2014, 03/07/2015  . Pneumococcal Conjugate-13 04/08/2016  . Pneumococcal Polysaccharide-23 01/18/2008  . Td 07/31/2004  . Tdap 07/31/2004  . Zoster 06/25/2005    Health Maintenance  Topic Date Due  .  Samul Dada  07/31/2014  . INFLUENZA VACCINE  01/14/2018  . PNA vac Low Risk Adult  Completed    Lab Results  Component Value Date   WBC 6.5 12/08/2016   HGB 15.9 12/08/2016   HCT 46.0 12/08/2016   PLT 165.0 12/08/2016   GLUCOSE 83 12/08/2016   CHOL 170 12/08/2016   TRIG 115.0 12/08/2016  HDL 38.60 (L) 12/08/2016   LDLCALC 108 (H) 12/08/2016   ALT 9 12/08/2016   AST 13 12/08/2016   NA 138 12/08/2016   K 4.1 12/08/2016   CL 102 12/08/2016   CREATININE 0.78 12/08/2016   BUN 17 12/08/2016   CO2 30 12/08/2016   TSH 2.15 10/17/2014   PSA 0.71 12/08/2016   HGBA1C 5.8 06/29/2009    Lab Results  Component Value Date   TSH 2.15 10/17/2014   Lab Results  Component Value Date   WBC 6.5 12/08/2016   HGB 15.9 12/08/2016   HCT 46.0 12/08/2016   MCV 103.9 (H) 12/08/2016   PLT 165.0 12/08/2016   Lab Results  Component Value Date   NA 138 12/08/2016   K 4.1 12/08/2016   CO2 30 12/08/2016   GLUCOSE 83 12/08/2016   BUN 17 12/08/2016   CREATININE 0.78 12/08/2016   BILITOT 1.0 12/08/2016   ALKPHOS 57 12/08/2016   AST 13 12/08/2016   ALT 9 12/08/2016   PROT 6.8 12/08/2016   ALBUMIN 4.4 12/08/2016   CALCIUM 9.3 12/08/2016   GFR 98.90 12/08/2016   Lab Results  Component Value Date   CHOL 170 12/08/2016   Lab Results  Component Value Date   HDL 38.60 (L) 12/08/2016   Lab Results  Component Value Date   LDLCALC 108 (H) 12/08/2016   Lab Results  Component Value Date   TRIG 115.0 12/08/2016   Lab Results  Component Value Date   CHOLHDL 4 12/08/2016   Lab Results  Component Value Date   HGBA1C 5.8 06/29/2009      strep-- neg==  Culture sent   Assessment & Plan:   Problem List Items Addressed This Visit    None    Visit Diagnoses    Sore throat    -  Primary   Relevant Orders   POCT rapid strep A   Culture, Group A Strep (Completed)   Bronchitis       Relevant Medications   azithromycin (ZITHROMAX Z-PAK) 250 MG tablet    promethazine-dextromethorphan (PROMETHAZINE-DM) 6.25-15 MG/5ML syrup   Pansinusitis, unspecified chronicity       Relevant Medications   azithromycin (ZITHROMAX Z-PAK) 250 MG tablet   fluticasone (FLONASE) 50 MCG/ACT nasal spray   promethazine-dextromethorphan (PROMETHAZINE-DM) 6.25-15 MG/5ML syrup      I have discontinued Kevin Glass "Stan"'s promethazine-dextromethorphan. I am also having him start on azithromycin, fluticasone, and promethazine-dextromethorphan. Additionally, I am having him maintain his vitamin C, Saw Palmetto, Vitamin D, aspirin, diphenhydramine-acetaminophen, Fish Oil, fluticasone, lactulose, levocetirizine, and omeprazole.  Meds ordered this encounter  Medications  . azithromycin (ZITHROMAX Z-PAK) 250 MG tablet    Sig: As directed    Dispense:  6 each    Refill:  0  . fluticasone (FLONASE) 50 MCG/ACT nasal spray    Sig: Place 2 sprays into both nostrils daily.    Dispense:  16 g    Refill:  6  . promethazine-dextromethorphan (PROMETHAZINE-DM) 6.25-15 MG/5ML syrup    Sig: Take 5 mLs by mouth 4 (four) times daily as needed.    Dispense:  118 mL    Refill:  0    CMA served as scribe during this visit. History, Physical and Plan performed by medical provider. Documentation and orders reviewed and attested to.  Ann Held, DO

## 2017-10-12 NOTE — Patient Instructions (Signed)

## 2017-10-14 LAB — CULTURE, GROUP A STREP
MICRO NUMBER:: 90518964
SPECIMEN QUALITY:: ADEQUATE

## 2017-10-15 LAB — POCT RAPID STREP A (OFFICE): Rapid Strep A Screen: NEGATIVE

## 2017-12-11 ENCOUNTER — Ambulatory Visit (INDEPENDENT_AMBULATORY_CARE_PROVIDER_SITE_OTHER): Payer: Medicare Other | Admitting: Family Medicine

## 2017-12-11 ENCOUNTER — Encounter: Payer: Self-pay | Admitting: Family Medicine

## 2017-12-11 VITALS — BP 136/78 | HR 103 | Temp 97.9°F | Resp 16 | Wt 197.2 lb

## 2017-12-11 DIAGNOSIS — J324 Chronic pansinusitis: Secondary | ICD-10-CM

## 2017-12-11 DIAGNOSIS — Z0000 Encounter for general adult medical examination without abnormal findings: Secondary | ICD-10-CM

## 2017-12-11 DIAGNOSIS — R739 Hyperglycemia, unspecified: Secondary | ICD-10-CM | POA: Diagnosis not present

## 2017-12-11 DIAGNOSIS — N400 Enlarged prostate without lower urinary tract symptoms: Secondary | ICD-10-CM | POA: Diagnosis not present

## 2017-12-11 DIAGNOSIS — K5901 Slow transit constipation: Secondary | ICD-10-CM

## 2017-12-11 LAB — CBC WITH DIFFERENTIAL/PLATELET
BASOS PCT: 0.6 % (ref 0.0–3.0)
Basophils Absolute: 0 10*3/uL (ref 0.0–0.1)
EOS ABS: 0.2 10*3/uL (ref 0.0–0.7)
Eosinophils Relative: 3.1 % (ref 0.0–5.0)
HCT: 47.1 % (ref 39.0–52.0)
Hemoglobin: 16.2 g/dL (ref 13.0–17.0)
Lymphocytes Relative: 40 % (ref 12.0–46.0)
Lymphs Abs: 2.3 10*3/uL (ref 0.7–4.0)
MCHC: 34.5 g/dL (ref 30.0–36.0)
MCV: 105.3 fl — ABNORMAL HIGH (ref 78.0–100.0)
MONO ABS: 0.4 10*3/uL (ref 0.1–1.0)
Monocytes Relative: 6.9 % (ref 3.0–12.0)
NEUTROS PCT: 49.4 % (ref 43.0–77.0)
Neutro Abs: 2.9 10*3/uL (ref 1.4–7.7)
Platelets: 168 10*3/uL (ref 150.0–400.0)
RBC: 4.47 Mil/uL (ref 4.22–5.81)
RDW: 13 % (ref 11.5–15.5)
WBC: 5.9 10*3/uL (ref 4.0–10.5)

## 2017-12-11 LAB — COMPREHENSIVE METABOLIC PANEL
ALBUMIN: 4.4 g/dL (ref 3.5–5.2)
ALT: 10 U/L (ref 0–53)
AST: 15 U/L (ref 0–37)
Alkaline Phosphatase: 54 U/L (ref 39–117)
BUN: 13 mg/dL (ref 6–23)
CHLORIDE: 100 meq/L (ref 96–112)
CO2: 31 meq/L (ref 19–32)
CREATININE: 0.79 mg/dL (ref 0.40–1.50)
Calcium: 9.5 mg/dL (ref 8.4–10.5)
GFR: 97.24 mL/min (ref >60.00)
GLUCOSE: 97 mg/dL (ref 70–99)
Potassium: 5 mEq/L (ref 3.5–5.1)
SODIUM: 138 meq/L (ref 135–145)
Total Bilirubin: 1 mg/dL (ref 0.2–1.2)
Total Protein: 7 g/dL (ref 6.0–8.3)

## 2017-12-11 LAB — LIPID PANEL
CHOL/HDL RATIO: 4
CHOLESTEROL: 186 mg/dL (ref 0–200)
HDL: 43.4 mg/dL (ref >39.00)
LDL Cholesterol: 121 mg/dL — ABNORMAL HIGH (ref 0–99)
NONHDL: 142.62
Triglycerides: 107 mg/dL (ref 0.0–149.0)
VLDL: 21.4 mg/dL (ref 0.0–40.0)

## 2017-12-11 LAB — HEMOGLOBIN A1C: HEMOGLOBIN A1C: 5.7 % (ref 4.6–6.5)

## 2017-12-11 LAB — PSA: PSA: 1.03 ng/mL (ref 0.10–4.00)

## 2017-12-11 MED ORDER — FLUTICASONE PROPIONATE 50 MCG/ACT NA SUSP: 2.0000 | Freq: Every day | NASAL | 6 refills | Status: DC

## 2017-12-11 MED ORDER — LACTULOSE 10 GM/15ML PO SOLN
20.0000 g | Freq: Two times a day (BID) | ORAL | 1 refills | Status: DC | PRN
Start: 2017-12-11 — End: 2023-05-25

## 2017-12-11 NOTE — Patient Instructions (Signed)
Preventive Care 82 Years and Older, Male Preventive care refers to lifestyle choices and visits with your health care provider that can promote health and wellness. What does preventive care include?  A yearly physical exam. This is also called an annual well check.  Dental exams once or twice a year.  Routine eye exams. Ask your health care provider how often you should have your eyes checked.  Personal lifestyle choices, including: ? Daily care of your teeth and gums. ? Regular physical activity. ? Eating a healthy diet. ? Avoiding tobacco and drug use. ? Limiting alcohol use. ? Practicing safe sex. ? Taking low doses of aspirin every day. ? Taking vitamin and mineral supplements as recommended by your health care provider. What happens during an annual well check? The services and screenings done by your health care provider during your annual well check will depend on your age, overall health, lifestyle risk factors, and family history of disease. Counseling Your health care provider may ask you questions about your:  Alcohol use.  Tobacco use.  Drug use.  Emotional well-being.  Home and relationship well-being.  Sexual activity.  Eating habits.  History of falls.  Memory and ability to understand (cognition).  Work and work environment.  Screening You may have the following tests or measurements:  Height, weight, and BMI.  Blood pressure.  Lipid and cholesterol levels. These may be checked every 5 years, or more frequently if you are over 50 years old.  Skin check.  Lung cancer screening. You may have this screening every year starting at age 55 if you have a 30-pack-year history of smoking and currently smoke or have quit within the past 15 years.  Fecal occult blood test (FOBT) of the stool. You may have this test every year starting at age 50.  Flexible sigmoidoscopy or colonoscopy. You may have a sigmoidoscopy every 5 years or a colonoscopy every 10  years starting at age 50.  Prostate cancer screening. Recommendations will vary depending on your family history and other risks.  Hepatitis C blood test.  Hepatitis B blood test.  Sexually transmitted disease (STD) testing.  Diabetes screening. This is done by checking your blood sugar (glucose) after you have not eaten for a while (fasting). You may have this done every 1-3 years.  Abdominal aortic aneurysm (AAA) screening. You may need this if you are a current or former smoker.  Osteoporosis. You may be screened starting at age 70 if you are at high risk.  Talk with your health care provider about your test results, treatment options, and if necessary, the need for more tests. Vaccines Your health care provider may recommend certain vaccines, such as:  Influenza vaccine. This is recommended every year.  Tetanus, diphtheria, and acellular pertussis (Tdap, Td) vaccine. You may need a Td booster every 10 years.  Varicella vaccine. You may need this if you have not been vaccinated.  Zoster vaccine. You may need this after age 60.  Measles, mumps, and rubella (MMR) vaccine. You may need at least one dose of MMR if you were born in 1957 or later. You may also need a second dose.  Pneumococcal 13-valent conjugate (PCV13) vaccine. One dose is recommended after age 65.  Pneumococcal polysaccharide (PPSV23) vaccine. One dose is recommended after age 65.  Meningococcal vaccine. You may need this if you have certain conditions.  Hepatitis A vaccine. You may need this if you have certain conditions or if you travel or work in places where you   may be exposed to hepatitis A.  Hepatitis B vaccine. You may need this if you have certain conditions or if you travel or work in places where you may be exposed to hepatitis B.  Haemophilus influenzae type b (Hib) vaccine. You may need this if you have certain risk factors.  Talk to your health care provider about which screenings and vaccines  you need and how often you need them. This information is not intended to replace advice given to you by your health care provider. Make sure you discuss any questions you have with your health care provider. Document Released: 06/29/2015 Document Revised: 02/20/2016 Document Reviewed: 04/03/2015 Elsevier Interactive Patient Education  2018 Elsevier Inc[employer]  

## 2017-12-11 NOTE — Progress Notes (Signed)
Subjective:  I acted as a Education administrator for [employer]. Kevin Glass   Patient ID: Kevin Glass, male    DOB: 08-29-1922, 84 y.o.   MRN: [id number]  Chief Complaint  Patient presents with  . Annual Exam    HPI  Patient is in today for annual exam. No complaints.    Patient Care Team: [name], [name], DO as PCP - General (Family Medicine) Inda Castle, MD (Inactive) as Consulting Physician (Gastroenterology) Mirna Mires, DDS as Consulting Physician (Dentistry)   Past Medical History:  Diagnosis Date  . Arthritis   . BPH (benign prostatic hypertrophy)   . Colonic polyp   . Hemorrhoid     Past Surgical History:  Procedure Laterality Date  . HEMORRHOID SURGERY    . TOTAL KNEE ARTHROPLASTY  2003   Dr Maureen Ralphs    Family History  Problem Relation Age of Onset  . Cancer Mother   . Arthritis Sister     Social History   Socioeconomic History  . Marital status: Married    Spouse name: Not on file  . Number of children: Not on file  . Years of education: Not on file  . Highest education level: Not on file  Occupational History  . Occupation: retired  Scientific laboratory technician  . Financial resource strain: Not on file  . Food insecurity:    Worry: Not on file    Inability: Not on file  . Transportation needs:    Medical: Not on file    Non-medical: Not on file  Tobacco Use  . Smoking status: Former Smoker    Last attempt to quit: 06/17/1983    Years since quitting: 34.5  . Smokeless tobacco: Never Used  Substance and Sexual Activity  . Alcohol use: No    Alcohol/week: 0.0 oz  . Drug use: No  . Sexual activity: Yes    Partners: Female  Lifestyle  . Physical activity:    Days per week: Not on file    Minutes per session: Not on file  . Stress: Not on file  Relationships  . Social connections:    Talks on phone: Not on file    Gets together: Not on file    Attends religious service: Not on file    Active member of club or organization: Not on file   Attends meetings of clubs or organizations: Not on file    Relationship status: Not on file  . Intimate partner violence:    Fear of current or ex partner: Not on file    Emotionally abused: Not on file    Physically abused: Not on file    Forced sexual activity: Not on file  Other Topics Concern  . Not on file  Social History Narrative   Exercise--[employer] twice a week and walks dog    Outpatient Medications Prior to Visit  Medication Sig Dispense Refill  . Ascorbic Acid (VITAMIN C) 500 MG tablet Take 1,000 mg by mouth daily.     [name] aspirin 81 MG tablet Take 81 mg by mouth every other day.    . Cholecalciferol (VITAMIN D) 2000 UNITS CAPS Take 1 capsule by mouth daily.    . diphenhydramine-acetaminophen (TYLENOL PM) 25-500 MG TABS tablet Take 1 tablet by mouth at bedtime.    . fluticasone (FLONASE) 50 MCG/ACT nasal spray Place 2 sprays into both nostrils daily. 16 g 6  . levocetirizine (XYZAL) 5 MG tablet Take 1 tablet (5 mg total) by mouth every evening.  30 tablet 5  . Omega-3 Fatty Acids (FISH OIL) 1200 MG CAPS Take by mouth.    [name] omeprazole (PRILOSEC) 20 MG capsule TAKE 1 CAPSULE BY MOUTH  DAILY 90 capsule 1  . Saw Palmetto 500 MG CAPS Take 1 capsule by mouth every other day.     [name] azithromycin (ZITHROMAX Z-PAK) 250 MG tablet As directed 6 each 0  . fluticasone (FLONASE) 50 MCG/ACT nasal spray Place 2 sprays into both nostrils daily. 16 g 6  . lactulose (CHRONULAC) 10 GM/15ML solution Take 30 mLs (20 g total) 2 (two) times daily as needed by mouth for moderate constipation. 1800 mL 1  . promethazine-dextromethorphan (PROMETHAZINE-DM) 6.25-15 MG/5ML syrup Take 5 mLs by mouth 4 (four) times daily as needed. 118 mL 0   No facility-administered medications prior to visit.     Allergies  Allergen Reactions  . Penicillins Rash    White blister on the skin     Review of Systems  Constitutional: Negative for chills, fever and malaise/fatigue.  HENT: Negative for congestion and  hearing loss.   Eyes: Negative for blurred vision and discharge.  Respiratory: Negative for cough, sputum production and shortness of breath.   Cardiovascular: Negative for chest pain, palpitations and leg swelling.  Gastrointestinal: Negative for abdominal pain, blood in stool, constipation, diarrhea, heartburn, nausea and vomiting.  Genitourinary: Negative for dysuria, frequency, hematuria and urgency.  Musculoskeletal: Negative for back pain, falls and myalgias.  Skin: Negative for rash.  Neurological: Negative for dizziness, sensory change, loss of consciousness, weakness and headaches.  Endo/Heme/Allergies: Negative for environmental allergies. Does not bruise/bleed easily.  Psychiatric/Behavioral: Negative for depression and suicidal ideas. The patient is not nervous/anxious and does not have insomnia.        Objective:    Physical Exam  Constitutional: He is oriented to person, place, and time. He appears well-developed and well-nourished. No distress.  HENT:  Head: Normocephalic and atraumatic.  Right Ear: External ear normal.  Left Ear: External ear normal.  Nose: Nose normal.  Mouth/Throat: Oropharynx is clear and moist. No oropharyngeal exudate.  Eyes: Pupils are equal, round, and reactive to light. Conjunctivae and EOM are normal. Right eye exhibits no discharge. Left eye exhibits no discharge.  Neck: Normal range of motion. Neck supple. No JVD present. No thyromegaly present.  Cardiovascular: Normal rate, regular rhythm and intact distal pulses.  No murmur heard. Pulmonary/Chest: Effort normal and breath sounds normal. No respiratory distress. He has no wheezes. He has no rales. He exhibits no tenderness.  Abdominal: Soft. Bowel sounds are normal. He exhibits no distension and no mass. There is no tenderness. There is no rebound and no guarding.  Genitourinary:  Genitourinary Comments: Deferred at pt request  Musculoskeletal: Normal range of motion. He exhibits no edema  or tenderness.  Lymphadenopathy:    He has no cervical adenopathy.  Neurological: He is alert and oriented to person, place, and time. He has normal reflexes. He displays normal reflexes. No cranial nerve deficit. He exhibits normal muscle tone.  Skin: Skin is warm and dry. No rash noted. He is not diaphoretic. No erythema.  Psychiatric: He has a normal mood and affect. His behavior is normal. Judgment and thought content normal.  Nursing note and vitals reviewed.   BP 136/78 (BP Location: Left Arm, Patient Position: Sitting, Cuff Size: Normal)   Pulse (!) 103   Temp 97.9 F (36.6 C) (Oral)   Resp 16   Wt 197 lb 3.2 oz (89.4 kg)  SpO2 98%   BMI 29.12 kg/m  Wt Readings from Last 3 Encounters:  12/11/17 197 lb 3.2 oz (89.4 kg)  10/12/17 203 lb (92.1 kg)  06/18/17 207 lb 6.4 oz (94.1 kg)   BP Readings from Last 3 Encounters:  12/11/17 136/78  10/12/17 130/68  06/18/17 132/78     Immunization History  Administered Date(s) Administered  . Influenza, High Dose Seasonal PF 03/24/2016, 04/10/2017  . Influenza,inj,Quad PF,6+ Mos 05/27/2013, 04/05/2014, 03/07/2015  . Pneumococcal Conjugate-13 04/08/2016  . Pneumococcal Polysaccharide-23 01/18/2008  . Td 07/31/2004  . Tdap 07/31/2004  . Zoster 06/25/2005    Health Maintenance  Topic Date Due  . Samul Dada  07/31/2014  . INFLUENZA VACCINE  01/14/2018  . PNA vac Low Risk Adult  Completed    Lab Results  Component Value Date   WBC 5.9 12/11/2017   HGB 16.2 12/11/2017   HCT 47.1 12/11/2017   PLT 168.0 12/11/2017   GLUCOSE 97 12/11/2017   CHOL 186 12/11/2017   TRIG 107.0 12/11/2017   HDL 43.40 12/11/2017   LDLCALC 121 (H) 12/11/2017   ALT 10 12/11/2017   AST 15 12/11/2017   NA 138 12/11/2017   K 5.0 12/11/2017   CL 100 12/11/2017   CREATININE 0.79 12/11/2017   BUN 13 12/11/2017   CO2 31 12/11/2017   TSH 2.15 10/17/2014   PSA 1.03 12/11/2017   HGBA1C 5.7 12/11/2017    Lab Results  Component Value Date   TSH  2.15 10/17/2014   Lab Results  Component Value Date   WBC 5.9 12/11/2017   HGB 16.2 12/11/2017   HCT 47.1 12/11/2017   MCV 105.3 (H) 12/11/2017   PLT 168.0 12/11/2017   Lab Results  Component Value Date   NA 138 12/11/2017   K 5.0 12/11/2017   CO2 31 12/11/2017   GLUCOSE 97 12/11/2017   BUN 13 12/11/2017   CREATININE 0.79 12/11/2017   BILITOT 1.0 12/11/2017   ALKPHOS 54 12/11/2017   AST 15 12/11/2017   ALT 10 12/11/2017   PROT 7.0 12/11/2017   ALBUMIN 4.4 12/11/2017   CALCIUM 9.5 12/11/2017   GFR 97.24 12/11/2017   Lab Results  Component Value Date   CHOL 186 12/11/2017   Lab Results  Component Value Date   HDL 43.40 12/11/2017   Lab Results  Component Value Date   LDLCALC 121 (H) 12/11/2017   Lab Results  Component Value Date   TRIG 107.0 12/11/2017   Lab Results  Component Value Date   CHOLHDL 4 12/11/2017   Lab Results  Component Value Date   HGBA1C 5.7 12/11/2017         Assessment & Plan:   Problem List Items Addressed This Visit      Unprioritized   Constipation   Relevant Medications   lactulose (Weber) 10 GM/15ML solution   Preventative health care - Primary    See avs Check labs  ghm utd       Other Visit Diagnoses    BPH without urinary obstruction       Relevant Orders   Lipid panel (Completed)   PSA (Completed)   Hyperglycemia       Relevant Orders   CBC with Differential/Platelet (Completed)   Comprehensive metabolic panel (Completed)   Lipid panel (Completed)   Hemoglobin A1c (Completed)   Pansinusitis, unspecified chronicity          I have discontinued Kevin Glass "Stan"'s azithromycin and promethazine-dextromethorphan. I have also changed his lactulose. Additionally,  I am having him maintain his vitamin C, Saw Palmetto, Vitamin D, aspirin, diphenhydramine-acetaminophen, Fish Oil, levocetirizine, omeprazole, and fluticasone.  Meds ordered this encounter  Medications  . DISCONTD: fluticasone (FLONASE) 50  MCG/ACT nasal spray    Sig: Place 2 sprays into both nostrils daily.    Dispense:  16 g    Refill:  6  . lactulose (CHRONULAC) 10 GM/15ML solution    Sig: Take 30 mLs (20 g total) by mouth 2 (two) times daily as needed for moderate constipation.    Dispense:  1800 mL    Refill:  1    CMA served as scribe during this visit. History, Physical and Plan performed by medical provider. Documentation and orders reviewed and attested to.  Ann Held, DO

## 2017-12-12 NOTE — Assessment & Plan Note (Signed)
See avs Check labs ghm utd 

## 2017-12-31 ENCOUNTER — Other Ambulatory Visit: Payer: Self-pay | Admitting: Family Medicine

## 2018-03-12 ENCOUNTER — Ambulatory Visit (INDEPENDENT_AMBULATORY_CARE_PROVIDER_SITE_OTHER): Payer: Medicare Other

## 2018-03-12 DIAGNOSIS — Z23 Encounter for immunization: Secondary | ICD-10-CM

## 2018-03-12 NOTE — Addendum Note (Signed)
Addended by: Bunnie Domino on: 03/12/2018 03:34 PM   Modules accepted: Orders

## 2018-03-12 NOTE — Progress Notes (Signed)
Immunization 

## 2018-06-06 ENCOUNTER — Emergency Department (HOSPITAL_BASED_OUTPATIENT_CLINIC_OR_DEPARTMENT_OTHER)
Admission: EM | Admit: 2018-06-06 | Discharge: 2018-06-06 | Disposition: A | Discharge status: Home / Self Care | Payer: Medicare Other | Attending: Emergency Medicine | Admitting: Emergency Medicine

## 2018-06-06 ENCOUNTER — Emergency Department (HOSPITAL_BASED_OUTPATIENT_CLINIC_OR_DEPARTMENT_OTHER): Payer: Medicare Other

## 2018-06-06 ENCOUNTER — Encounter (HOSPITAL_BASED_OUTPATIENT_CLINIC_OR_DEPARTMENT_OTHER): Payer: Self-pay | Admitting: Emergency Medicine

## 2018-06-06 ENCOUNTER — Other Ambulatory Visit: Payer: Self-pay

## 2018-06-06 DIAGNOSIS — Z79899 Other long term (current) drug therapy: Secondary | ICD-10-CM | POA: Insufficient documentation

## 2018-06-06 DIAGNOSIS — Z96659 Presence of unspecified artificial knee joint: Secondary | ICD-10-CM | POA: Insufficient documentation

## 2018-06-06 DIAGNOSIS — R05 Cough: Secondary | ICD-10-CM | POA: Insufficient documentation

## 2018-06-06 DIAGNOSIS — Z87891 Personal history of nicotine dependence: Secondary | ICD-10-CM | POA: Diagnosis not present

## 2018-06-06 DIAGNOSIS — R059 Cough, unspecified: Secondary | ICD-10-CM

## 2018-06-06 DIAGNOSIS — Z7982 Long term (current) use of aspirin: Secondary | ICD-10-CM | POA: Diagnosis not present

## 2018-06-06 MED ORDER — HYDROCODONE-HOMATROPINE 5-1.5 MG/5ML PO SYRP: 5.0000 mL | ORAL_SOLUTION | Freq: Four times a day (QID) | ORAL | 0 refills | Status: DC | PRN

## 2018-06-06 MED ORDER — AZITHROMYCIN 250 MG PO TABS: ORAL_TABLET | ORAL | 0 refills | Status: DC

## 2018-06-06 NOTE — ED Triage Notes (Signed)
Cough and congestion x 1 week 

## 2018-06-06 NOTE — ED Provider Notes (Signed)
[facility] Provider Note   CSN: [id number] Arrival date & time: 06/06/18  8657     History   Chief Complaint Chief Complaint  Patient presents with  . Cough    HPI Kevin Glass is a 84 y.o. male.  HPI Started having a cough about 1 week ago.  He reports he is also had some nasal congestion and sinus pressure.  This is worsened over the course of the past week.  Reports cough is now productive.  No chest pain or shortness of breath.  No general myalgias.  No headache, confusion or incoordination.  No vomiting no diarrhea.  No lower extremity swelling.  Patient reports in the past he has had similar episode and it was pneumonia.  Has been trying over-the-counter preparations without relief of symptoms. Past Medical History:  Diagnosis Date  . Arthritis   . BPH (benign prostatic hypertrophy)   . Colonic polyp   . Hemorrhoid     Patient Active Problem List   Diagnosis Date Noted  . Dyspepsia 04/09/2016  . Tendinopathy of rotator cuff 01/02/2015  . Right knee pain 09/21/2014  . Constipation 09/18/2014  . Preventative health care 04/05/2014  . Obesity (BMI 30-39.9) 05/27/2013  . URI (upper respiratory infection) 11/21/2010  . DIZZINESS AND GIDDINESS 12/18/2009  . NONSPECIFIC ABNORMAL ELECTROCARDIOGRAM 06/27/2009  . HEMORRHOIDS 08/26/2007  . DIVERTICULOSIS, COLON 08/26/2007  . ARTHRITIS 08/26/2007  . COLONIC POLYPS 05/04/2007  . IMPAIRED GLUCOSE TOLERANCE 02/23/2007  . BPH (benign prostatic hyperplasia) 02/23/2007    Past Surgical History:  Procedure Laterality Date  . HEMORRHOID SURGERY    . TOTAL KNEE ARTHROPLASTY  2003   Dr Maureen Ralphs        Home Medications    Prior to Admission medications   Medication Sig Start Date End Date Taking? Authorizing Provider  Ascorbic Acid (VITAMIN C) 500 MG tablet Take 1,000 mg by mouth daily.     [provider]  aspirin 81 MG tablet Take 81 mg by mouth every other day.    [provider]  azithromycin (ZITHROMAX Z-PAK) 250 MG tablet 2 po day one, then 1 daily x 4 days 06/06/18   Charlesetta Shanks, MD  Cholecalciferol (VITAMIN D) 2000 UNITS CAPS Take 1 capsule by mouth daily.    [provider]  diphenhydramine-acetaminophen (TYLENOL PM) 25-500 MG TABS tablet Take 1 tablet by mouth at bedtime.    [provider]  fluticasone (FLONASE) 50 MCG/ACT nasal spray Place 2 sprays into both nostrils daily. 10/12/17   Ann Held, DO  HYDROcodone-homatropine (HYCODAN) 5-1.5 MG/5ML syrup Take 5 mLs by mouth every 6 (six) hours as needed for cough. 06/06/18   Charlesetta Shanks, MD  lactulose (CHRONULAC) 10 GM/15ML solution Take 30 mLs (20 g total) by mouth 2 (two) times daily as needed for moderate constipation. 12/11/17   Ann Held, DO  levocetirizine (XYZAL) 5 MG tablet Take 1 tablet (5 mg total) by mouth every evening. 06/18/17   Ann Held, DO  Omega-3 Fatty Acids (FISH OIL) 1200 MG CAPS Take by mouth.    [provider]  omeprazole (PRILOSEC) 20 MG capsule TAKE 1 CAPSULE BY MOUTH  DAILY 12/31/17   [name], Yvonne R, DO  Saw Palmetto 500 MG CAPS Take 1 capsule by mouth every other day.     [provider]    Family History Family History  Problem Relation Age of Onset  . Cancer Mother   .  Arthritis Sister     Social History Social History   Tobacco Use  . Smoking status: Former Smoker    Last attempt to quit: 06/17/1983    Years since quitting: 34.9  . Smokeless tobacco: Never Used  Substance Use Topics  . Alcohol use: No    Alcohol/week: 0.0 standard drinks  . Drug use: No     Allergies   Penicillins   Review of Systems Review of Systems 10 Systems reviewed and are negative for acute change except as noted in the HPI.  Physical Exam Updated Vital Signs BP (!) 132/56 (BP Location: Left Arm)   Pulse (!) 54   Temp 98 F (36.7 C) (Oral)   Resp 18   Ht 5\' 9"  (1.753 m)   Wt 90.7 kg    SpO2 99%   BMI 29.53 kg/m   Physical Exam Constitutional:      Appearance: Normal appearance.  HENT:     Head: Normocephalic and atraumatic.     Right Ear: Tympanic membrane normal.     Left Ear: Tympanic membrane normal.     Nose: Nose normal.     Mouth/Throat:     Mouth: Mucous membranes are moist.  Eyes:     Extraocular Movements: Extraocular movements intact.  Neck:     Musculoskeletal: Neck supple.  Cardiovascular:     Rate and Rhythm: Normal rate and regular rhythm.  Pulmonary:     Comments: No respiratory distress.  Crackles lower right lung field.  No wheeze or rhonchi. Abdominal:     General: There is no distension.     Palpations: Abdomen is soft.     Tenderness: There is no abdominal tenderness.  Musculoskeletal: Normal range of motion.        General: No tenderness.     Right lower leg: No edema.     Left lower leg: No edema.  Skin:    General: Skin is warm and dry.  Neurological:     General: No focal deficit present.     Mental Status: He is alert.  Psychiatric:        Mood and Affect: Mood normal.      ED Treatments / Results  Labs (all labs ordered are listed, but only abnormal results are displayed) Labs Reviewed - No data to display  EKG None  Radiology Dg Chest 2 View  Result Date: 06/06/2018 CLINICAL DATA:  Productive cough with congestion. EXAM: CHEST - 2 VIEW COMPARISON:  10/01/2011 FINDINGS: Lungs are somewhat hypoinflated with bilateral patchy coarse interstitial changes with slight interval progression. Findings likely chronic. No focal airspace process or effusion. Mild stable cardiomegaly. Remainder the exam is unchanged. IMPRESSION: Hypoinflation with mild patchy coarse interstitial changes with slight interval progression likely chronic. No acute airspace process. Mild stable cardiomegaly. Electronically Signed   By: Marin Olp M.D.   On: 06/06/2018 08:00    Procedures Procedures (including critical care time)  Medications  Ordered in ED Medications - No data to display   Initial Impression / Assessment and Plan / ED Course  I have reviewed the triage vital signs and the nursing notes.  Pertinent labs & imaging results that were available during my care of the patient were reviewed by me and considered in my medical decision making (see chart for details).     Patient was clinically stable in appearance.  He is alert and nontoxic.  No respiratory distress.  Patient has clear mental status.  He has had symptoms of cough  for approximately a week.  Cough is now productive.  Chest x-ray shows equivocal coarsening of interstitial markings.  Consider likely chronic but in light of patient's symptoms will opt to treat as possible pneumonia.  Patient is however clinically well for outpatient management.  He has no respiratory distress no hypoxia, stable vital signs.  Start Z-Pak and Hycodan syrup.  Return precautions reviewed.  Final Clinical Impressions(s) / ED Diagnoses   Final diagnoses:  Cough    ED Discharge Orders         Ordered    azithromycin (ZITHROMAX Z-PAK) 250 MG tablet     06/06/18 0832    HYDROcodone-homatropine (HYCODAN) 5-1.5 MG/5ML syrup  Every 6 hours PRN     06/06/18 2763           Charlesetta Shanks, MD 06/06/18 [id number]

## 2018-06-29 ENCOUNTER — Ambulatory Visit (INDEPENDENT_AMBULATORY_CARE_PROVIDER_SITE_OTHER): Payer: Medicare Other | Admitting: Family Medicine

## 2018-06-29 ENCOUNTER — Encounter: Payer: Self-pay | Admitting: Family Medicine

## 2018-06-29 VITALS — BP 120/70 | HR 49 | Temp 98.4°F | Resp 16 | Ht 69.0 in | Wt 200.8 lb

## 2018-06-29 DIAGNOSIS — R059 Cough, unspecified: Secondary | ICD-10-CM

## 2018-06-29 DIAGNOSIS — R05 Cough: Secondary | ICD-10-CM | POA: Diagnosis not present

## 2018-06-29 DIAGNOSIS — J069 Acute upper respiratory infection, unspecified: Secondary | ICD-10-CM | POA: Diagnosis not present

## 2018-06-29 MED ORDER — FLUTICASONE PROPIONATE 50 MCG/ACT NA SUSP: 2.0000 | Freq: Every day | NASAL | 6 refills | Status: DC

## 2018-06-29 MED ORDER — PROMETHAZINE-DM 6.25-15 MG/5ML PO SYRP: 5.0000 mL | ORAL_SOLUTION | Freq: Four times a day (QID) | ORAL | 0 refills | Status: DC | PRN

## 2018-06-29 NOTE — Progress Notes (Signed)
Patient ID: Kevin Glass, male    DOB: 05/18/1923  Age: 85 y.o. MRN: [id number]    Subjective:  Subjective  HPI  LADARRELL CORNWALL presents for runny nose , dry cough x <1 week.  No fever no chest pain, sob, wheezing.    Review of Systems  Constitutional: Negative for chills and fever.  HENT: Positive for congestion, postnasal drip and rhinorrhea. Negative for sinus pressure and sneezing.   Respiratory: Negative for cough, chest tightness, shortness of breath, wheezing and stridor.   Cardiovascular: Negative for chest pain, palpitations and leg swelling.  Allergic/Immunologic: Negative for environmental allergies.  Psychiatric/Behavioral: Negative.     History Past Medical History:  Diagnosis Date  . Arthritis   . BPH (benign prostatic hypertrophy)   . Colonic polyp   . Hemorrhoid     He has a past surgical history that includes Total knee arthroplasty (2003) and Hemorrhoid surgery.   His family history includes Arthritis in his sister; Cancer in his mother.He reports that he quit smoking about 35 years ago. He has never used smokeless tobacco. He reports that he does not drink alcohol or use drugs.  Current Outpatient Medications on File Prior to Visit  Medication Sig Dispense Refill  . Ascorbic Acid (VITAMIN C) 500 MG tablet Take 1,000 mg by mouth daily.     [name] aspirin 81 MG tablet Take 81 mg by mouth every other day.    . Cholecalciferol (VITAMIN D) 2000 UNITS CAPS Take 1 capsule by mouth daily.    . diphenhydramine-acetaminophen (TYLENOL PM) 25-500 MG TABS tablet Take 1 tablet by mouth at bedtime.    [name] lactulose (CHRONULAC) 10 GM/15ML solution Take 30 mLs (20 g total) by mouth 2 (two) times daily as needed for moderate constipation. 1800 mL 1  . Omega-3 Fatty Acids (FISH OIL) 1200 MG CAPS Take by mouth.    [name] omeprazole (PRILOSEC) 20 MG capsule TAKE 1 CAPSULE BY MOUTH  DAILY 90 capsule 1  . Saw Palmetto 500 MG CAPS Take 1 capsule by mouth every other day.      No current  facility-administered medications on file prior to visit.      Objective:  Objective  Physical Exam Vitals signs and nursing note reviewed.  Constitutional:      Appearance: He is well-developed.  HENT:     Right Ear: External ear normal.     Left Ear: External ear normal.     Nose: Rhinorrhea present.     Mouth/Throat:     Pharynx: Posterior oropharyngeal erythema present.  Eyes:     General:        Right eye: No discharge.        Left eye: No discharge.     Conjunctiva/sclera: Conjunctivae normal.  Cardiovascular:     Rate and Rhythm: Normal rate and regular rhythm.     Heart sounds: Normal heart sounds. No murmur.  Pulmonary:     Effort: Pulmonary effort is normal. No respiratory distress.     Breath sounds: Normal breath sounds. No wheezing or rales.  Chest:     Chest wall: No tenderness.  Lymphadenopathy:     Cervical: No cervical adenopathy.  Neurological:     Mental Status: He is alert and oriented to person, place, and time.    BP 120/70 (BP Location: Left Arm, Cuff Size: Normal)   Pulse (!) 49   Temp 98.4 F (36.9 C) (Oral)   Resp 16   Ht 5\' 9"  (1.753 m)  Wt 200 lb 12.8 oz (91.1 kg)   SpO2 97%   BMI 29.65 kg/m  Wt Readings from Last 3 Encounters:  06/29/18 200 lb 12.8 oz (91.1 kg)  06/06/18 200 lb (90.7 kg)  12/11/17 197 lb 3.2 oz (89.4 kg)     Lab Results  Component Value Date   WBC 5.9 12/11/2017   HGB 16.2 12/11/2017   HCT 47.1 12/11/2017   PLT 168.0 12/11/2017   GLUCOSE 97 12/11/2017   CHOL 186 12/11/2017   TRIG 107.0 12/11/2017   HDL 43.40 12/11/2017   LDLCALC 121 (H) 12/11/2017   ALT 10 12/11/2017   AST 15 12/11/2017   NA 138 12/11/2017   K 5.0 12/11/2017   CL 100 12/11/2017   CREATININE 0.79 12/11/2017   BUN 13 12/11/2017   CO2 31 12/11/2017   TSH 2.15 10/17/2014   PSA 1.03 12/11/2017   HGBA1C 5.7 12/11/2017    Dg Chest 2 View  Result Date: 06/06/2018 CLINICAL DATA:  Productive cough with congestion. EXAM: CHEST - 2 VIEW  COMPARISON:  10/01/2011 FINDINGS: Lungs are somewhat hypoinflated with bilateral patchy coarse interstitial changes with slight interval progression. Findings likely chronic. No focal airspace process or effusion. Mild stable cardiomegaly. Remainder the exam is unchanged. IMPRESSION: Hypoinflation with mild patchy coarse interstitial changes with slight interval progression likely chronic. No acute airspace process. Mild stable cardiomegaly. Electronically Signed   By: Marin Olp M.D.   On: 06/06/2018 08:00     Assessment & Plan:  Plan  I have discontinued Denzil Magnuson "Stan"'s levocetirizine, azithromycin, and HYDROcodone-homatropine. I am also having him start on promethazine-dextromethorphan. Additionally, I am having him maintain his vitamin C, Saw Palmetto, Vitamin D, aspirin, diphenhydramine-acetaminophen, Fish Oil, lactulose, omeprazole, and fluticasone.  Meds ordered this encounter  Medications  . fluticasone (FLONASE) 50 MCG/ACT nasal spray    Sig: Place 2 sprays into both nostrils daily.    Dispense:  16 g    Refill:  6  . promethazine-dextromethorphan (PROMETHAZINE-DM) 6.25-15 MG/5ML syrup    Sig: Take 5 mLs by mouth 4 (four) times daily as needed.    Dispense:  118 mL    Refill:  0    Problem List Items Addressed This Visit      Unprioritized   Viral URI - Primary   Relevant Medications   fluticasone (FLONASE) 50 MCG/ACT nasal spray    Other Visit Diagnoses    Cough       Relevant Medications   promethazine-dextromethorphan (PROMETHAZINE-DM) 6.25-15 MG/5ML syrup    can use otc antihistamine as well rto prn  Follow-up: Return if symptoms worsen or fail to improve.  Ann Held, DO

## 2018-06-29 NOTE — Patient Instructions (Signed)
Upper Respiratory Infection, Adult An upper respiratory infection (URI) is a common viral infection of the nose, throat, and upper air passages that lead to the lungs. The most common type of URI is the common cold. URIs usually get better on their own, without medical treatment. What are the causes? A URI is caused by a virus. You may catch a virus by:  Breathing in droplets from an infected person's cough or sneeze.  Touching something that has been exposed to the virus (contaminated) and then touching your mouth, nose, or eyes. What increases the risk? You are more likely to get a URI if:  You are very young or very old.  It is autumn or winter.  You have close contact with others, such as at a daycare, school, or health care facility.  You smoke.  You have long-term (chronic) heart or lung disease.  You have a weakened disease-fighting (immune) system.  You have nasal allergies or asthma.  You are experiencing a lot of stress.  You work in an area that has poor air circulation.  You have poor nutrition. What are the signs or symptoms? A URI usually involves some of the following symptoms:  Runny or stuffy (congested) nose.  Sneezing.  Cough.  Sore throat.  Headache.  Fatigue.  Fever.  Loss of appetite.  Pain in your forehead, behind your eyes, and over your cheekbones (sinus pain).  Muscle aches.  Redness or irritation of the eyes.  Pressure in the ears or face. How is this diagnosed? This condition may be diagnosed based on your medical history and symptoms, and a physical exam. Your health care provider may use a cotton swab to take a mucus sample from your nose (nasal swab). This sample can be tested to determine what virus is causing the illness. How is this treated? URIs usually get better on their own within 7-10 days. You can take steps at home to relieve your symptoms. Medicines cannot cure URIs, but your health care provider may recommend  certain medicines to help relieve symptoms, such as:  Over-the-counter cold medicines.  Cough suppressants. Coughing is a type of defense against infection that helps to clear the respiratory system, so take these medicines only as recommended by your health care provider.  Fever-reducing medicines. Follow these instructions at home: Activity  Rest as needed.  If you have a fever, stay home from work or school until your fever is gone or until your health care provider says you are no longer contagious. Your health care provider may have you wear a face mask to prevent your infection from spreading. Relieving symptoms  Gargle with a salt-water mixture 3-4 times a day or as needed. To make a salt-water mixture, completely dissolve -1 tsp of salt in 1 cup of warm water.  Use a cool-mist humidifier to add moisture to the air. This can help you breathe more easily. Eating and drinking   Drink enough fluid to keep your urine pale yellow.  Eat soups and other clear broths. General instructions   Take over-the-counter and prescription medicines only as told by your health care provider. These include cold medicines, fever reducers, and cough suppressants.  Do not use any products that contain nicotine or tobacco, such as cigarettes and e-cigarettes. If you need help quitting, ask your health care provider.  Stay away from secondhand smoke.  Stay up to date on all immunizations, including the yearly (annual) flu vaccine.  Keep all follow-up visits as told by your health   care provider. This is important. How to prevent the spread of infection to others   URIs can be passed from person to person (are contagious). To prevent the infection from spreading: ? Wash your hands often with soap and water. If soap and water are not available, use hand sanitizer. ? Avoid touching your mouth, face, eyes, or nose. ? Cough or sneeze into a tissue or your sleeve or elbow instead of into your hand  or into the air. Contact a health care provider if:  You are getting worse instead of better.  You have a fever or chills.  Your mucus is brown or red.  You have yellow or brown discharge coming from your nose.  You have pain in your face, especially when you bend forward.  You have swollen neck glands.  You have pain while swallowing.  You have white areas in the back of your throat. Get help right away if:  You have shortness of breath that gets worse.  You have severe or persistent: ? Headache. ? Ear pain. ? Sinus pain. ? Chest pain.  You have chronic lung disease along with any of the following: ? Wheezing. ? Prolonged cough. ? Coughing up blood. ? A change in your usual mucus.  You have a stiff neck.  You have changes in your: ? Vision. ? Hearing. ? Thinking. ? Mood. Summary  An upper respiratory infection (URI) is a common infection of the nose, throat, and upper air passages that lead to the lungs.  A URI is caused by a virus.  URIs usually get better on their own within 7-10 days.  Medicines cannot cure URIs, but your health care provider may recommend certain medicines to help relieve symptoms. This information is not intended to replace advice given to you by your health care provider. Make sure you discuss any questions you have with your health care provider. Document Released: 11/26/2000 Document Revised: 01/16/2017 Document Reviewed: 01/16/2017 Elsevier Interactive Patient Education  2019 [employer]    

## 2018-07-05 ENCOUNTER — Other Ambulatory Visit: Payer: Self-pay | Admitting: Family Medicine

## 2018-07-05 ENCOUNTER — Ambulatory Visit: Payer: Self-pay

## 2018-07-05 DIAGNOSIS — R05 Cough: Secondary | ICD-10-CM

## 2018-07-05 DIAGNOSIS — R059 Cough, unspecified: Secondary | ICD-10-CM

## 2018-07-05 MED ORDER — PROMETHAZINE-DM 6.25-15 MG/5ML PO SYRP: 5.0000 mL | ORAL_SOLUTION | Freq: Four times a day (QID) | ORAL | 0 refills | Status: DC | PRN

## 2018-07-05 NOTE — Telephone Encounter (Signed)
Any changes in symptoms?  I sent in cough meds ----  Cough productive?  Fever?

## 2018-07-05 NOTE — Telephone Encounter (Signed)
Patient called to say that he is still coughing and need to get some more cough medicine sent to the pharmacy. Request Rx sent to the [facility] [place] [facility] [place] [phone] (Phone) [phone] (Fax) See triage assessment.  Medication is promethazine-dextromethorphan  Reason for Disposition . Caller requesting a NON-URGENT new prescription or refill and triager unable to refill per unit policy  Answer Assessment - Initial Assessment Questions 1. ONSET: "When did the cough begin?"      Week and a half 2. SEVERITY: "How bad is the cough today?"      Coughing spells when drainage goes down in throat causes coughing 3. RESPIRATORY DISTRESS: "Describe your breathing."      no 4. FEVER: "Do you have a fever?" If so, ask: "What is your temperature, how was it measured, and when did it start?"     no 5. SPUTUM: "Describe the color of your sputum" (clear, white, yellow, green)     clear 6. HEMOPTYSIS: "Are you coughing up any blood?" If so ask: "How much?" (flecks, streaks, tablespoons, etc.)     no 7. CARDIAC HISTORY: "Do you have any history of heart disease?" (e.g., heart attack, congestive heart failure)      no 8. LUNG HISTORY: "Do you have any history of lung disease?"  (e.g., pulmonary embolus, asthma, emphysema)     no 9. PE RISK FACTORS: "Do you have a history of blood clots?" (or: recent major surgery, recent prolonged travel, bedridden)     no 10. OTHER SYMPTOMS: "Do you have any other symptoms?" (e.g., runny nose, wheezing, chest pain)       Runny nose, not sleeping well at night waking frequently to cough. 11. PREGNANCY: "Is there any chance you are pregnant?" "When was your last menstrual period?"       n/a 12. TRAVEL: "Have you traveled out of the country in the last month?" (e.g., travel history, exposures)       no  Answer Assessment - Initial Assessment Questions 1. SYMPTOMS: "Do you have any symptoms?"     Waking up coughing at  night, cough is present  2. SEVERITY: If symptoms are present, ask "Are they mild, moderate or severe?"     Waking up frequently to cough  Protocols used: MEDICATION QUESTION CALL-A-AH, COUGH - ACUTE PRODUCTIVE-A-AH

## 2018-07-06 NOTE — Telephone Encounter (Signed)
Patient stated that he was a little better, just the cough.  He has a little phlegm coming up.

## 2018-07-09 ENCOUNTER — Ambulatory Visit (HOSPITAL_BASED_OUTPATIENT_CLINIC_OR_DEPARTMENT_OTHER)
Admission: RE | Admit: 2018-07-09 | Discharge: 2018-07-09 | Disposition: A | Discharge status: Home / Self Care | Payer: Medicare Other | Source: Ambulatory Visit | Attending: Internal Medicine | Admitting: Internal Medicine

## 2018-07-09 ENCOUNTER — Ambulatory Visit (INDEPENDENT_AMBULATORY_CARE_PROVIDER_SITE_OTHER): Payer: Medicare Other | Admitting: Internal Medicine

## 2018-07-09 ENCOUNTER — Encounter: Payer: Self-pay | Admitting: Internal Medicine

## 2018-07-09 VITALS — BP 126/74 | HR 76 | Temp 98.0°F | Resp 16 | Ht 69.0 in | Wt 198.1 lb

## 2018-07-09 DIAGNOSIS — R05 Cough: Secondary | ICD-10-CM | POA: Insufficient documentation

## 2018-07-09 DIAGNOSIS — R059 Cough, unspecified: Secondary | ICD-10-CM

## 2018-07-09 DIAGNOSIS — I499 Cardiac arrhythmia, unspecified: Secondary | ICD-10-CM | POA: Diagnosis not present

## 2018-07-09 MED ORDER — DOXYCYCLINE HYCLATE 100 MG PO TABS: 100.0000 mg | ORAL_TABLET | Freq: Two times a day (BID) | ORAL | 0 refills | Status: DC

## 2018-07-09 MED ORDER — PREDNISONE 10 MG PO TABS: ORAL_TABLET | ORAL | 0 refills | Status: DC

## 2018-07-09 MED ORDER — AZELASTINE HCL 0.1 % NA SOLN: 2.0000 | Freq: Two times a day (BID) | NASAL | 6 refills | Status: DC

## 2018-07-09 NOTE — Progress Notes (Signed)
Pre visit review using our clinic review tool, if applicable. No additional management support is needed unless otherwise documented below in the visit note. 

## 2018-07-09 NOTE — Patient Instructions (Addendum)
  STOP BY THE FIRST FLOOR:  get the XR    Flonase 2 sprays twice a day until better  Astelin: 2 sprays on each side of the nose twice a day until better  Robitussin-DM as needed for cough  Prednisone for few days  Hydrocodone, okay to take a teaspoon at bedtime if the cough persists.  If you are not gradually better in the next couple of  weeks please let me know

## 2018-07-09 NOTE — Progress Notes (Signed)
Subjective:    Patient ID: Kevin Glass, male    DOB: 1923-03-20, 85 y.o.   MRN: [id number]  DOS:  07/09/2018 Type of visit - description: Acute, here with his daughter Symptoms started 4 weeks ago: Went to the ER 06/06/2018, got a Z-Pak, and hydrocodone.  Chest x-ray showed no acute process. Got temporarily better but then symptoms continue. Saw PCP 06/29/2018, prescribed Flonase and a cough medication. Is here because continue with abundant nasal congestion and postnasal dripping. The mucus pools in the throat and he coughed. Has been unable to sleep well because the symptoms are worse when he is lying down.  I also noticed irregular heartbeat.  Review of Systems Denies fever chills No nausea, vomiting, diarrhea No chest pain Some shortness of breath mostly with coughing spells. Denies itchy eyes or nose, no sneezing.  Past Medical History:  Diagnosis Date  . Arthritis   . BPH (benign prostatic hypertrophy)   . Colonic polyp   . Hemorrhoid     Past Surgical History:  Procedure Laterality Date  . HEMORRHOID SURGERY    . TOTAL KNEE ARTHROPLASTY  2003   Dr Maureen Ralphs    Social History   Socioeconomic History  . Marital status: Married    Spouse name: Not on file  . Number of children: Not on file  . Years of education: Not on file  . Highest education level: Not on file  Occupational History  . Occupation: retired  Scientific laboratory technician  . Financial resource strain: Not on file  . Food insecurity:    Worry: Not on file    Inability: Not on file  . Transportation needs:    Medical: Not on file    Non-medical: Not on file  Tobacco Use  . Smoking status: Former Smoker    Last attempt to quit: 06/17/1983    Years since quitting: 35.0  . Smokeless tobacco: Never Used  Substance and Sexual Activity  . Alcohol use: No    Alcohol/week: 0.0 standard drinks  . Drug use: No  . Sexual activity: Yes    Partners: Female  Lifestyle  . Physical activity:    Days per week: Not  on file    Minutes per session: Not on file  . Stress: Not on file  Relationships  . Social connections:    Talks on phone: Not on file    Gets together: Not on file    Attends religious service: Not on file    Active member of club or organization: Not on file    Attends meetings of clubs or organizations: Not on file    Relationship status: Not on file  . Intimate partner violence:    Fear of current or ex partner: Not on file    Emotionally abused: Not on file    Physically abused: Not on file    Forced sexual activity: Not on file  Other Topics Concern  . Not on file  Social History Narrative   Exercise--[employer] twice a week and walks dog      Allergies as of 07/09/2018      Reactions   Penicillins Rash   White blister on the skin      Medication List       Accurate as of July 09, 2018 11:59 PM. Always use your most recent med list.        aspirin 81 MG tablet Take 81 mg by mouth every other day.   azelastine 0.1 % nasal  spray Commonly known as:  ASTELIN Place 2 sprays into both nostrils 2 (two) times daily.   diphenhydramine-acetaminophen 25-500 MG Tabs tablet Commonly known as:  TYLENOL PM Take 1 tablet by mouth at bedtime.   doxycycline 100 MG tablet Commonly known as:  VIBRA-TABS Take 1 tablet (100 mg total) by mouth 2 (two) times daily.   Fish Oil 1200 MG Caps Take by mouth.   fluticasone 50 MCG/ACT nasal spray Commonly known as:  FLONASE Place 2 sprays into both nostrils daily.   lactulose 10 GM/15ML solution Commonly known as:  CHRONULAC Take 30 mLs (20 g total) by mouth 2 (two) times daily as needed for moderate constipation.   omeprazole 20 MG capsule Commonly known as:  PRILOSEC TAKE 1 CAPSULE BY MOUTH  DAILY   predniSONE 10 MG tablet Commonly known as:  DELTASONE 2 tabs a day x 5 days   promethazine-dextromethorphan 6.25-15 MG/5ML syrup Commonly known as:  PROMETHAZINE-DM Take 5 mLs by mouth 4 (four) times daily as needed.     Saw Palmetto 500 MG Caps Take 1 capsule by mouth every other day.   vitamin C 500 MG tablet Commonly known as:  ASCORBIC ACID Take 1,000 mg by mouth daily.   Vitamin D 50 MCG (2000 UT) Caps Take 1 capsule by mouth daily.           Objective:   Physical Exam BP 126/74 (BP Location: Left Arm, Patient Position: Sitting, Cuff Size: Normal)   Pulse 76   Temp 98 F (36.7 C) (Oral)   Resp 16   Ht 5\' 9"  (1.753 m)   Wt 198 lb 2 oz (89.9 kg)   SpO2 96%   BMI 29.26 kg/m  General:   Well developed, NAD, BMI noted. HEENT:  Normocephalic . Face symmetric, atraumatic. TMs: No bulge or red.  Nose congested, sinuses no TTP. Lungs:   decreased breath sounds Normal respiratory effort, no intercostal retractions, no accessory muscle use. Heart: Seems irregular, no murmur.  No pretibial edema bilaterally  Skin: Not pale. Not jaundice Neurologic:  alert & oriented X3.  Speech normal, gait appropriate for age and unassisted Psych--  Cognition and judgment appear intact.  Cooperative with normal attention span and concentration.  Behavior appropriate. No anxious or depressed appearing.      Assessment     85 year old gent a month, PMH includes BPH, DJD, presents with  Cough: Most likely due to postnasal dripping. Due to persistence of cough will get a second x-ray Also recommend: Flonase twice a day, Astelin twice a day, Robitussin-DM as needed, he has a leftover hydrocodone syrup, okay to take it at night. To decrease the mucus burden, will give him small amount of prednisone.  Call if not better. Addendum: Chest x-ray showed increased airspace disease, doxycycline added. Irregular heartbeat?  EKG: No A. fib.  Sinus rhythm, first-degree AV block, no major change compared to 2011

## 2018-07-10 ENCOUNTER — Encounter: Payer: Self-pay | Admitting: Internal Medicine

## 2018-07-18 ENCOUNTER — Other Ambulatory Visit: Payer: Self-pay | Admitting: Family Medicine

## 2018-07-28 ENCOUNTER — Ambulatory Visit (INDEPENDENT_AMBULATORY_CARE_PROVIDER_SITE_OTHER): Payer: Medicare Other | Admitting: Family Medicine

## 2018-07-28 ENCOUNTER — Encounter: Payer: Self-pay | Admitting: Family Medicine

## 2018-07-28 VITALS — BP 114/70 | HR 90 | Temp 97.8°F | Resp 16 | Ht 69.0 in | Wt 199.6 lb

## 2018-07-28 DIAGNOSIS — J3489 Other specified disorders of nose and nasal sinuses: Secondary | ICD-10-CM

## 2018-07-28 MED ORDER — LORATADINE 10 MG PO TABS: 10.0000 mg | ORAL_TABLET | Freq: Every day | ORAL | 11 refills | Status: DC

## 2018-07-28 NOTE — Progress Notes (Signed)
Subjective:    Patient ID: Kevin Glass, male    DOB: 1922/10/22, 85 y.o.   MRN: [id number]  Chief Complaint  Patient presents with  . Cough    feeling better, no fever  . nasal drainage    nasal drip, using nasal spray BID    HPI Patient is in today for runny nose --- he is over the cold he had but still has the runny nose and is using the nose sprays --flonase and astelin.  No fevers,  Mucus is clear.    Past Medical History:  Diagnosis Date  . Arthritis   . BPH (benign prostatic hypertrophy)   . Colonic polyp   . Hemorrhoid     Past Surgical History:  Procedure Laterality Date  . HEMORRHOID SURGERY    . TOTAL KNEE ARTHROPLASTY  2003   Dr Maureen Ralphs    Family History  Problem Relation Age of Onset  . Cancer Mother   . Arthritis Sister     Social History   Socioeconomic History  . Marital status: Married    Spouse name: Not on file  . Number of children: Not on file  . Years of education: Not on file  . Highest education level: Not on file  Occupational History  . Occupation: retired  Scientific laboratory technician  . Financial resource strain: Not on file  . Food insecurity:    Worry: Not on file    Inability: Not on file  . Transportation needs:    Medical: Not on file    Non-medical: Not on file  Tobacco Use  . Smoking status: Former Smoker    Last attempt to quit: 06/17/1983    Years since quitting: 35.1  . Smokeless tobacco: Never Used  Substance and Sexual Activity  . Alcohol use: No    Alcohol/week: 0.0 standard drinks  . Drug use: No  . Sexual activity: Yes    Partners: Female  Lifestyle  . Physical activity:    Days per week: Not on file    Minutes per session: Not on file  . Stress: Not on file  Relationships  . Social connections:    Talks on phone: Not on file    Gets together: Not on file    Attends religious service: Not on file    Active member of club or organization: Not on file    Attends meetings of clubs or organizations: Not on file   Relationship status: Not on file  . Intimate partner violence:    Fear of current or ex partner: Not on file    Emotionally abused: Not on file    Physically abused: Not on file    Forced sexual activity: Not on file  Other Topics Concern  . Not on file  Social History Narrative   Exercise--[employer] twice a week and walks dog    Outpatient Medications Prior to Visit  Medication Sig Dispense Refill  . Ascorbic Acid (VITAMIN C) 500 MG tablet Take 1,000 mg by mouth daily.     [name] aspirin 81 MG tablet Take 81 mg by mouth every other day.    [name] azelastine (ASTELIN) 0.1 % nasal spray Place 2 sprays into both nostrils 2 (two) times daily. 30 mL 6  . Cholecalciferol (VITAMIN D) 2000 UNITS CAPS Take 1 capsule by mouth daily.    . diphenhydramine-acetaminophen (TYLENOL PM) 25-500 MG TABS tablet Take 1 tablet by mouth at bedtime.    . fluticasone (FLONASE) 50 MCG/ACT nasal spray  Place 2 sprays into both nostrils daily. 16 g 6  . lactulose (CHRONULAC) 10 GM/15ML solution Take 30 mLs (20 g total) by mouth 2 (two) times daily as needed for moderate constipation. 1800 mL 1  . Omega-3 Fatty Acids (FISH OIL) 1200 MG CAPS Take by mouth.    [name] omeprazole (PRILOSEC) 20 MG capsule TAKE 1 CAPSULE BY MOUTH  DAILY 90 capsule 1  . Saw Palmetto 500 MG CAPS Take 1 capsule by mouth every other day.     [name] doxycycline (VIBRA-TABS) 100 MG tablet Take 1 tablet (100 mg total) by mouth 2 (two) times daily. 15 tablet 0  . predniSONE (DELTASONE) 10 MG tablet 2 tabs a day x 5 days 10 tablet 0  . promethazine-dextromethorphan (PROMETHAZINE-DM) 6.25-15 MG/5ML syrup Take 5 mLs by mouth 4 (four) times daily as needed. 118 mL 0   No facility-administered medications prior to visit.     Allergies  Allergen Reactions  . Penicillins Rash    White blister on the skin     Review of Systems  Constitutional: Negative for fever and malaise/fatigue.  HENT: Negative for congestion.        Runny nose  Eyes: Negative for blurred  vision.  Respiratory: Negative for cough and shortness of breath.   Cardiovascular: Negative for chest pain, palpitations and leg swelling.  Gastrointestinal: Negative for abdominal pain, blood in stool and nausea.  Genitourinary: Negative for dysuria and frequency.  Musculoskeletal: Negative for falls.  Skin: Negative for rash.  Neurological: Negative for dizziness, loss of consciousness and headaches.  Endo/Heme/Allergies: Negative for environmental allergies.  Psychiatric/Behavioral: Negative for depression. The patient is not nervous/anxious.        Objective:    Physical Exam Vitals signs and nursing note reviewed.  Constitutional:      General: He is sleeping.     Appearance: He is well-developed.  HENT:     Head: Normocephalic and atraumatic.     Nose: Rhinorrhea present.     Comments: r nostril --  Turbinates red  Clear drainage Eyes:     Pupils: Pupils are equal, round, and reactive to light.  Neck:     Musculoskeletal: Normal range of motion and neck supple.     Thyroid: No thyromegaly.  Cardiovascular:     Rate and Rhythm: Normal rate and regular rhythm.     Heart sounds: No murmur.  Pulmonary:     Effort: Pulmonary effort is normal. No respiratory distress.     Breath sounds: Normal breath sounds. No wheezing or rales.  Chest:     Chest wall: No tenderness.  Musculoskeletal:        General: No tenderness.  Skin:    General: Skin is warm and dry.  Neurological:     Mental Status: He is oriented to person, place, and time.  Psychiatric:        Behavior: Behavior normal.        Thought Content: Thought content normal.        Judgment: Judgment normal.     BP 114/70 (BP Location: Left Arm, Patient Position: Sitting, Cuff Size: Normal) Comment (BP Location): le  Pulse 90   Temp 97.8 F (36.6 C) (Oral)   Resp 16   Ht 5\' 9"  (1.753 m)   Wt 199 lb 9.6 oz (90.5 kg)   SpO2 100%   BMI 29.48 kg/m  Wt Readings from Last 3 Encounters:  07/28/18 199 lb 9.6 oz  (90.5 kg)  07/09/18 198 lb  2 oz (89.9 kg)  06/29/18 200 lb 12.8 oz (91.1 kg)     Lab Results  Component Value Date   WBC 5.9 12/11/2017   HGB 16.2 12/11/2017   HCT 47.1 12/11/2017   PLT 168.0 12/11/2017   GLUCOSE 97 12/11/2017   CHOL 186 12/11/2017   TRIG 107.0 12/11/2017   HDL 43.40 12/11/2017   LDLCALC 121 (H) 12/11/2017   ALT 10 12/11/2017   AST 15 12/11/2017   NA 138 12/11/2017   K 5.0 12/11/2017   CL 100 12/11/2017   CREATININE 0.79 12/11/2017   BUN 13 12/11/2017   CO2 31 12/11/2017   TSH 2.15 10/17/2014   PSA 1.03 12/11/2017   HGBA1C 5.7 12/11/2017    Lab Results  Component Value Date   TSH 2.15 10/17/2014   Lab Results  Component Value Date   WBC 5.9 12/11/2017   HGB 16.2 12/11/2017   HCT 47.1 12/11/2017   MCV 105.3 (H) 12/11/2017   PLT 168.0 12/11/2017   Lab Results  Component Value Date   NA 138 12/11/2017   K 5.0 12/11/2017   CO2 31 12/11/2017   GLUCOSE 97 12/11/2017   BUN 13 12/11/2017   CREATININE 0.79 12/11/2017   BILITOT 1.0 12/11/2017   ALKPHOS 54 12/11/2017   AST 15 12/11/2017   ALT 10 12/11/2017   PROT 7.0 12/11/2017   ALBUMIN 4.4 12/11/2017   CALCIUM 9.5 12/11/2017   GFR 97.24 12/11/2017   Lab Results  Component Value Date   CHOL 186 12/11/2017   Lab Results  Component Value Date   HDL 43.40 12/11/2017   Lab Results  Component Value Date   LDLCALC 121 (H) 12/11/2017   Lab Results  Component Value Date   TRIG 107.0 12/11/2017   Lab Results  Component Value Date   CHOLHDL 4 12/11/2017   Lab Results  Component Value Date   HGBA1C 5.7 12/11/2017       Assessment & Plan:   Problem List Items Addressed This Visit    None    Visit Diagnoses    Rhinorrhea    -  Primary   Relevant Medications   loratadine (CLARITIN) 10 MG tablet    cont to use the flonase and astelin-- consider ent if no better     I have discontinued Denzil Magnuson "Stan"'s promethazine-dextromethorphan, predniSONE, and doxycycline. I am  also having him start on loratadine. Additionally, I am having him maintain his vitamin C, Saw Palmetto, Vitamin D, aspirin, diphenhydramine-acetaminophen, Fish Oil, lactulose, fluticasone, azelastine, and omeprazole.  Meds ordered this encounter  Medications  . loratadine (CLARITIN) 10 MG tablet    Sig: Take 1 tablet (10 mg total) by mouth daily.    Dispense:  30 tablet    Refill:  [id number] [place], [place]

## 2018-07-28 NOTE — Patient Instructions (Signed)
Loratadine capsules or tablets What is this medicine? LORATADINE (lor AT a deen) is an antihistamine. It helps to relieve sneezing, runny nose, and itchy, watery eyes. This medicine is used to treat the symptoms of allergies. It is also used to treat itchy skin rash and hives. This medicine may be used for other purposes; ask your health care provider or pharmacist if you have questions. COMMON BRAND NAME(S): Alavert, Allergy Relief, Claritin, Claritin Hives Relief, Claritin Liqui-Gel, Claritin-D 24 Hour, Clear-Atadine, QlearQuil All Day & All Night Allergy Relief, Tavist ND What should I tell my health care provider before I take this medicine? They need to know if you have any of these conditions: -asthma -kidney disease -liver disease -an unusual or allergic reaction to loratadine, other antihistamines, other medicines, foods, dyes, or preservatives -pregnant or trying to get pregnant -breast-feeding How should I use this medicine? Take this medicine by mouth with a glass of water. Follow the directions on the label. You may take this medicine with food or on an empty stomach. Take your medicine at regular intervals. Do not take your medicine more often than directed. Talk to your pediatrician regarding the use of this medicine in children. While this medicine may be used in children as young as 6 years for selected conditions, precautions do apply. Overdosage: If you think you have taken too much of this medicine contact a poison control center or emergency room at once. NOTE: This medicine is only for you. Do not share this medicine with others. What if I miss a dose? If you miss a dose, take it as soon as you can. If it is almost time for your next dose, take only that dose. Do not take double or extra doses. What may interact with this medicine? -other medicines for colds or allergies This list may not describe all possible interactions. Give your health care provider a list of all the  medicines, herbs, non-prescription drugs, or dietary supplements you use. Also tell them if you smoke, drink alcohol, or use illegal drugs. Some items may interact with your medicine. What should I watch for while using this medicine? Tell your doctor or healthcare professional if your symptoms do not start to get better or if they get worse. Your mouth may get dry. Chewing sugarless gum or sucking hard candy, and drinking plenty of water may help. Contact your doctor if the problem does not go away or is severe. You may get drowsy or dizzy. Do not drive, use machinery, or do anything that needs mental alertness until you know how this medicine affects you. Do not stand or sit up quickly, especially if you are an older patient. This reduces the risk of dizzy or fainting spells. What side effects may I notice from receiving this medicine? Side effects that you should report to your doctor or health care professional as soon as possible: -allergic reactions like skin rash, itching or hives, swelling of the face, lips, or tongue -breathing problems -unusually restless or nervous Side effects that usually do not require medical attention (report to your doctor or health care professional if they continue or are bothersome): -drowsiness -dry or irritated mouth or throat -headache This list may not describe all possible side effects. Call your doctor for medical advice about side effects. You may report side effects to FDA at 1-800-FDA-1088. Where should I keep my medicine? Keep out of the reach of children. Store at room temperature between 2 and 30 degrees C (36 and 86 degrees  F). Protect from moisture. Throw away any unused medicine after the expiration date. NOTE: This sheet is a summary. It may not cover all possible information. If you have questions about this medicine, talk to your doctor, pharmacist, or health care provider.  2019 Elsevier/Gold Standard (2007-12-06 17:17:24)

## 2018-10-28 DIAGNOSIS — R0982 Postnasal drip: Secondary | ICD-10-CM

## 2018-10-28 HISTORY — DX: Postnasal drip: R09.82

## 2018-11-15 DIAGNOSIS — R0982 Postnasal drip: Secondary | ICD-10-CM | POA: Diagnosis not present

## 2018-12-31 ENCOUNTER — Other Ambulatory Visit: Payer: Self-pay | Admitting: Family Medicine

## 2019-03-01 DIAGNOSIS — L821 Other seborrheic keratosis: Secondary | ICD-10-CM | POA: Diagnosis not present

## 2019-03-01 DIAGNOSIS — L570 Actinic keratosis: Secondary | ICD-10-CM | POA: Diagnosis not present

## 2019-03-11 ENCOUNTER — Other Ambulatory Visit: Payer: Self-pay

## 2019-03-11 ENCOUNTER — Ambulatory Visit (INDEPENDENT_AMBULATORY_CARE_PROVIDER_SITE_OTHER): Payer: Medicare Other

## 2019-03-11 DIAGNOSIS — Z23 Encounter for immunization: Secondary | ICD-10-CM

## 2019-03-11 NOTE — Progress Notes (Signed)
Here for follow up

## 2019-06-13 ENCOUNTER — Other Ambulatory Visit: Payer: Self-pay

## 2019-06-13 ENCOUNTER — Ambulatory Visit (HOSPITAL_BASED_OUTPATIENT_CLINIC_OR_DEPARTMENT_OTHER)
Admission: RE | Admit: 2019-06-13 | Discharge: 2019-06-13 | Disposition: A | Discharge status: Home / Self Care | Payer: Medicare Other | Source: Ambulatory Visit | Attending: Family Medicine | Admitting: Family Medicine

## 2019-06-13 ENCOUNTER — Other Ambulatory Visit: Payer: Self-pay | Admitting: Family Medicine

## 2019-06-13 ENCOUNTER — Encounter: Payer: Self-pay | Admitting: Family Medicine

## 2019-06-13 ENCOUNTER — Ambulatory Visit (INDEPENDENT_AMBULATORY_CARE_PROVIDER_SITE_OTHER): Payer: Medicare Other | Admitting: Family Medicine

## 2019-06-13 VITALS — BP 144/88 | HR 84 | Temp 97.4°F | Resp 18 | Ht 69.0 in | Wt 199.0 lb

## 2019-06-13 DIAGNOSIS — M546 Pain in thoracic spine: Secondary | ICD-10-CM

## 2019-06-13 DIAGNOSIS — R2689 Other abnormalities of gait and mobility: Secondary | ICD-10-CM | POA: Insufficient documentation

## 2019-06-13 DIAGNOSIS — M542 Cervicalgia: Secondary | ICD-10-CM

## 2019-06-13 DIAGNOSIS — M79644 Pain in right finger(s): Secondary | ICD-10-CM

## 2019-06-13 DIAGNOSIS — M47814 Spondylosis without myelopathy or radiculopathy, thoracic region: Secondary | ICD-10-CM | POA: Diagnosis not present

## 2019-06-13 DIAGNOSIS — R29890 Loss of height: Secondary | ICD-10-CM

## 2019-06-13 HISTORY — DX: Pain in thoracic spine: M54.6

## 2019-06-13 HISTORY — DX: Cervicalgia: M54.2

## 2019-06-13 HISTORY — DX: Other abnormalities of gait and mobility: R26.89

## 2019-06-13 HISTORY — DX: Pain in right finger(s): M79.644

## 2019-06-13 HISTORY — DX: Loss of height: R29.890

## 2019-06-13 NOTE — Assessment & Plan Note (Addendum)
Kyphosis / weakness---  Xray PT referral for stretching and strengthening  bmd ordered

## 2019-06-13 NOTE — Assessment & Plan Note (Signed)
Xray bmd PT ordered

## 2019-06-13 NOTE — Assessment & Plan Note (Signed)
Pt referral  Pt also having hearing trouble---  He has hearing aids and is having them checked this week  May need neuro

## 2019-06-13 NOTE — Patient Instructions (Signed)
Kyphosis  Kyphosis is a spinal disorder. It involves an excessive outward curve of the spine that causes abnormal rounding of the upper back. It occurs when the spinal bones (vertebrae) in the upper back (thoracic spine) become wedge-shaped and cause deformity. Kyphosis is sometimes called dowager's hump, hunchback, or roundback. It is most common among elderly people, but it can occur at any age. What are the causes? There are four main types of kyphosis, which have different causes. They include:  Postural kyphosis. This type is caused by poor posture or slouching. It does not involve severe abnormalities in the bone structure of the spine. This is the most common type of kyphosis, and it usually becomes noticeable during adolescence.  Congenital kyphosis. This is caused when the spine fails to develop normally while in the womb. A person is born with this type of kyphosis.  Scheuermann kyphosis. In this type of kyphosis, several of the vertebrae are more triangular in shape for unknown reasons. The curved spine usually becomes noticeable during adolescence.  Osteoporotic kyphosis. This type is caused by thinning and loss of density in the bones (osteoporosis), which results in small breaks (compression fractures) in the thoracic vertebrae. The compression fractures cause these vertebrae to become wedge-shaped over time. Other possible causes include:  Certain syndromes, such as Marfan syndrome or Prader-Willi disease.  Cancer and treatment for cancer. Cancer and its treatment can weaken the vertebrae and cause compression fractures.  Disk degeneration. This refers to the drying out and shrinking of the soft, circular structures between the vertebrae as part of the natural aging process. What are the signs or symptoms? The symptoms of kyphosis vary based on the cause and how severe the curve is (the severity). Symptoms may include:  Rounded shoulders.  A visible hump on the  back.  Fatigue.  Back pain (usually mild).  Spine stiffness.  Muscle tightness in the back of the thighs (hamstrings).  Loss of height. Severe symptoms of this condition include:  Loss of feeling in affected areas of the back.  Shortness of breath.  Weakness, numbness, or tingling in the legs. How is this diagnosed? This condition may be diagnosed based on:  A physical exam. Your health care provider may press on your spine to check for areas of tenderness. Severe cases of curvature will be noticeable by the rounding of the upper back. Milder cases may be harder to diagnose.  Your medical history. Your health care provider may ask about your general health and your symptoms.  X-rays.  MRI.  Tests of the nerves and nervous system (neurological tests). How is this treated? Treatment for this condition depends on your age and overall health, and the type and severity of your kyphosis. Non-surgical treatment may include:  Observation. Your health care provider may monitor your condition over time to make sure that it does not cause problems or get worse.  Physical therapy. This involves movements and exercises to strengthen the back.  NSAIDs to help relieve back pain, such as aspirin, ibuprofen, and naproxen.  Wearing a back brace to correct the curve. Surgery (spinal fusion or kyphoplasty) may be recommended if:  You have congenital kyphosis.  You have Scheuermann kyphosis with a curve greater than 75 degrees.  You have severe back pain that does not improve with non-surgical treatment. Follow these instructions at home:  Take over-the-counter and prescription medicines only as told by your health care provider.  Exercise regularly. Ask your health care provider what activities and exercises are  best for you. Keeping your back strong and flexible may help to relieve symptoms and prevent your condition from getting worse.  If physical therapy was prescribed, do  exercises as instructed.  Maintain good posture.  If you were prescribed a back brace: ? Wear the brace as told by your health care provider. ? Remove the brace for bathing. ? Avoid heavy lifting. Do not lift anything that is heavier than the limit that you are told until your health care provider says that it is safe.  Keep all follow-up visits as told by your health care provider. This is important, especially if your health care provider is monitoring your condition. Contact a health care provider if:  You notice a round hump forming on your back.  You have pain that does not get better with medicine. Summary  Kyphosis is a spinal disorder. It involves an excessive outward curve of the spine that causes abnormal rounding of the upper back.  Symptoms of kyphosis vary based on the cause and severe the curve is.  There are non-surgical and surgical treatments for kyphosis.  Exercise regularly. Ask your health care provider what activities and exercises are best for you. Keeping your back strong and flexible may help to relieve symptoms and prevent your condition from getting worse. This information is not intended to replace advice given to you by your health care provider. Make sure you discuss any questions you have with your health care provider. Document Released: 09/03/2016 Document Revised: 05/15/2017 Document Reviewed: 09/03/2016 Elsevier Patient Education  2020 [employer].

## 2019-06-13 NOTE — Assessment & Plan Note (Signed)
?   Trigger finger Refer to hand surgery

## 2019-06-13 NOTE — Assessment & Plan Note (Signed)
Check bmd 

## 2019-06-13 NOTE — Progress Notes (Signed)
Patient ID: Kevin Glass, male    DOB: 26-Nov-1922  Age: 85 y.o. MRN: [id number]    Subjective:  Subjective  HPI Kevin Glass presents for c/o pain in upper back and neck and loss of height.    He is unable to look up due to pain and dizziness  He is wanting to know what  He can do to stand straighter and he c/o pain in R ring finger -- it gets stuck in bent position  Review of Systems  Constitutional: Negative for appetite change, diaphoresis, fatigue and unexpected weight change.  Eyes: Negative for pain, redness and visual disturbance.  Respiratory: Negative for cough, chest tightness, shortness of breath and wheezing.   Cardiovascular: Negative for chest pain, palpitations and leg swelling.  Endocrine: Negative for cold intolerance, heat intolerance, polydipsia, polyphagia and polyuria.  Genitourinary: Negative for difficulty urinating, dysuria and frequency.  Musculoskeletal: Positive for back pain, joint swelling and neck pain.  Neurological: Negative for dizziness, light-headedness, numbness and headaches.    History Past Medical History:  Diagnosis Date  . Arthritis   . BPH (benign prostatic hypertrophy)   . Colonic polyp   . Hemorrhoid     He has a past surgical history that includes Total knee arthroplasty (2003) and Hemorrhoid surgery.   His family history includes Arthritis in his sister; Cancer in his mother.He reports that he quit smoking about 36 years ago. He has never used smokeless tobacco. He reports that he does not drink alcohol or use drugs.  Current Outpatient Medications on File Prior to Visit  Medication Sig Dispense Refill  . Ascorbic Acid (VITAMIN C) 500 MG tablet Take 1,000 mg by mouth daily.     [name] aspirin 81 MG tablet Take 81 mg by mouth every other day.    [name] azelastine (ASTELIN) 0.1 % nasal spray Place 2 sprays into both nostrils 2 (two) times daily. 30 mL 6  . Cholecalciferol (VITAMIN D) 2000 UNITS CAPS Take 1 capsule by mouth daily.    [name]  lactulose (CHRONULAC) 10 GM/15ML solution Take 30 mLs (20 g total) by mouth 2 (two) times daily as needed for moderate constipation. 1800 mL 1  . Omega-3 Fatty Acids (FISH OIL) 1200 MG CAPS Take by mouth.    [name] omeprazole (PRILOSEC) 20 MG capsule TAKE 1 CAPSULE BY MOUTH  DAILY 90 capsule 1  . Saw Palmetto 500 MG CAPS Take 1 capsule by mouth every other day.      No current facility-administered medications on file prior to visit.     Objective:  Objective  Physical Exam Vitals and nursing note reviewed.  Constitutional:      General: He is sleeping.     Appearance: He is well-developed.  HENT:     Head: Normocephalic and atraumatic.  Eyes:     Pupils: Pupils are equal, round, and reactive to light.  Neck:     Thyroid: No thyromegaly.  Cardiovascular:     Rate and Rhythm: Normal rate and regular rhythm.     Heart sounds: No murmur.  Pulmonary:     Effort: Pulmonary effort is normal. No respiratory distress.     Breath sounds: Normal breath sounds. No wheezing or rales.  Chest:     Chest wall: No tenderness.  Musculoskeletal:        General: Deformity present.     Right hand: Swelling and tenderness present.       Arms:     Cervical back: Normal range of  motion and neck supple.       Back:  Skin:    General: Skin is warm and dry.  Neurological:     Mental Status: He is oriented to person, place, and time.  Psychiatric:        Behavior: Behavior normal.        Thought Content: Thought content normal.        Judgment: Judgment normal.    BP (!) 144/88 (BP Location: Right Arm, Patient Position: Sitting, Cuff Size: Normal)   Pulse 84   Temp (!) 97.4 F (36.3 C) (Temporal)   Resp 18   Ht 5\' 9"  (1.753 m)   Wt 199 lb (90.3 kg)   SpO2 97%   BMI 29.39 kg/m  Wt Readings from Last 3 Encounters:  06/13/19 199 lb (90.3 kg)  07/28/18 199 lb 9.6 oz (90.5 kg)  07/09/18 198 lb 2 oz (89.9 kg)     Lab Results  Component Value Date   WBC 5.9 12/11/2017   HGB 16.2  12/11/2017   HCT 47.1 12/11/2017   PLT 168.0 12/11/2017   GLUCOSE 97 12/11/2017   CHOL 186 12/11/2017   TRIG 107.0 12/11/2017   HDL 43.40 12/11/2017   LDLCALC 121 (H) 12/11/2017   ALT 10 12/11/2017   AST 15 12/11/2017   NA 138 12/11/2017   K 5.0 12/11/2017   CL 100 12/11/2017   CREATININE 0.79 12/11/2017   BUN 13 12/11/2017   CO2 31 12/11/2017   TSH 2.15 10/17/2014   PSA 1.03 12/11/2017   HGBA1C 5.7 12/11/2017    DG Chest 2 View  Result Date: 07/09/2018 CLINICAL DATA:  Persistent cough EXAM: CHEST - 2 VIEW COMPARISON:  06/06/2018, 10/01/2011 FINDINGS: Mild cardiomegaly. Multifocal bilateral pulmonary scarring. Interval increased opacity within the right greater than left upper lobes and the left base. No pneumothorax IMPRESSION: 1. Multifocal scarring. Interval increase in airspace opacity within the upper lobes and left base, may reflect superimposed acute pneumonia. Radiographic follow-up to resolution is advised. 2. Mild cardiomegaly Electronically Signed   By: Donavan Foil M.D.   On: 07/09/2018 15:42     Assessment & Plan:  Plan  I have discontinued Denzil Magnuson "Stan"'s diphenhydramine-acetaminophen, fluticasone, and loratadine. I am also having him maintain his vitamin C, Saw Palmetto, Vitamin D, aspirin, Fish Oil, lactulose, azelastine, and omeprazole.  No orders of the defined types were placed in this encounter.   Problem List Items Addressed This Visit      Unprioritized   Balance problem    Pt referral  Pt also having hearing trouble---  He has hearing aids and is having them checked this week  May need neuro       Relevant Orders   Ambulatory referral to Physical Therapy   Finger pain, right    ? Trigger finger Refer to hand surgery      Relevant Orders   Ambulatory referral to Orthopedic Surgery   Height loss    Check bmd       Relevant Orders   DG BONE DENSITY (DXA)   Neck pain    Xray bmd PT ordered       Relevant Orders    Ambulatory referral to Physical Therapy   DG Cervical Spine Complete   Thoracic spine pain - Primary    Kyphosis / weakness---  Xray PT referral for stretching and strengthening  bmd ordered      Relevant Orders   Ambulatory referral to Physical Therapy  Follow-up: Return if symptoms worsen or fail to improve.  Ann Held, DO

## 2019-06-22 ENCOUNTER — Ambulatory Visit: Payer: Medicare Other | Attending: Family Medicine | Admitting: Physical Therapy

## 2019-06-22 ENCOUNTER — Other Ambulatory Visit: Payer: Self-pay

## 2019-06-22 DIAGNOSIS — M542 Cervicalgia: Secondary | ICD-10-CM

## 2019-06-22 DIAGNOSIS — R293 Abnormal posture: Secondary | ICD-10-CM | POA: Diagnosis not present

## 2019-06-22 DIAGNOSIS — R2681 Unsteadiness on feet: Secondary | ICD-10-CM | POA: Diagnosis not present

## 2019-06-22 DIAGNOSIS — Z9181 History of falling: Secondary | ICD-10-CM | POA: Diagnosis not present

## 2019-06-22 DIAGNOSIS — R262 Difficulty in walking, not elsewhere classified: Secondary | ICD-10-CM | POA: Insufficient documentation

## 2019-06-22 DIAGNOSIS — M546 Pain in thoracic spine: Secondary | ICD-10-CM | POA: Diagnosis not present

## 2019-06-22 NOTE — Patient Instructions (Signed)
    Home exercise program created by Tinna Kolker, PT.  For questions, please contact Lucie Friedlander via phone at [phone] or email at [email or web address]  [facility] [place] [address]  [address] [place], [place], [address] Phone: [phone]   Fax:  [phone]    

## 2019-06-22 NOTE — Therapy (Signed)
[facility] [place] [address]  [place], [place], [address] Phone: [phone]   Fax:  [phone]  Physical Therapy Evaluation  Patient Details  Name: Kevin Glass MRN: [id number] Date of Birth: [birth year] Referring Provider (PT): Roma Schanz, [place]   Encounter Date: 06/22/2019  PT End of Session - 06/22/19 0910    Visit Number  1    Number of Visits  16    Date for PT Re-Evaluation  08/17/19    Authorization Type  [employer] Medicare    PT Start Time  0910    PT Stop Time  1009    PT Time Calculation (min)  59 min    Activity Tolerance  Patient tolerated treatment well    Behavior During Therapy  [facility] for tasks assessed/performed       Past Medical History:  Diagnosis Date  . Arthritis   . BPH (benign prostatic hypertrophy)   . Colonic polyp   . Hemorrhoid     Past Surgical History:  Procedure Laterality Date  . HEMORRHOID SURGERY    . TOTAL KNEE ARTHROPLASTY  2003   Dr Maureen Ralphs    There were no vitals filed for this visit.   Subjective Assessment - 06/22/19 0914    Subjective  Patient reports 1-2 yr h/o intermittent neck, upper back and L shoulder/scapular pain. Notes worsening kyphosis since Jan 26, 2019 after his wife passed away. Stooped posture has led to increased instability but only 1 fall ~7-8 months ago when he was trying to straighten up to wash windows and fell backwards. Uses hurrycane style cane (4-way foot on cane) intermittently and has a rollator which he keeps in his bedroom to use at night when he goes to the bathroom.    Patient is accompained by:  Family member   daughter   Limitations  Sitting;Standing;Walking;House hold activities    Patient Stated Goals  "I would like to be able to straighten up more so I can look ahead while walking."    Currently in Pain?  No/denies    Pain Score  0-No pain   typically 5/10   Pain Location  Neck   & thoracic spine/scapula   Pain Orientation   Upper;Left;Lower    Pain Descriptors / Indicators  Sore    Pain Type  Chronic pain    Pain Radiating Towards  L scapula & shoulder    Pain Onset  Other (comment)   1-2 yrs   Pain Frequency  Intermittent    Aggravating Factors   uncertain    Pain Relieving Factors  [place]percreme    Effect of Pain on Daily Activities  not limited specifically due to pain         [facility] PT Assessment - 06/22/19 0910      Assessment   Referring Provider (PT)  Roma Schanz, DO    Onset Date/Surgical Date  --   Carlean Purl 2020   Prior Therapy  remote h/o PT for similar problem      Precautions   Precautions  Fall      Restrictions   Weight Bearing Restrictions  No      Balance Screen   Has the patient fallen in the past 6 months  No    How many times?  1 fall in past year (~7-8 months ago)    Has the patient had a decrease in activity level because of a fear of falling?   Yes  Is the patient reluctant to leave their home because of a fear of falling?   No      Home Social worker  Private residence    Living Arrangements  Children    Available Help at Discharge  Family    Type of [place] to enter   ramps available   Entrance Stairs-Number of Steps  1    [place] - single point;Walker - 4 wheels   adjustable bed     Prior Function   Level of Independence  Independent with basic ADLs;Independent with gait;Needs assistance with homemaking    Vocation  Retired    Leisure  prior to COVID-19 was going to gym 3x/wk      Cognition   Overall Cognitive Status  Within Functional Limits for tasks assessed      Observation/Other Assessments   Observations  resting position in ~80-90 dg thoracic flexion/kyphosis - able to push up with arms to straighten to ~60 dg      Posture/Postural Control   Posture/Postural Control  Postural limitations    Postural Limitations  Forward head;Rounded  Shoulders;Increased thoracic kyphosis      ROM / Strength   AROM / PROM / Strength  AROM;Strength      AROM   Overall AROM Comments  B shoulder flexion to ~110 dg    AROM Assessment Site  Cervical;Shoulder    Cervical Extension  20    Cervical - Right Side Bend  15    Cervical - Left Side Bend  15    Cervical - Right Rotation  45    Cervical - Left Rotation  32      Strength   Overall Strength  Within functional limits for tasks performed    Overall Strength Comments  B LE strength grossly 4/5 to 4+/5    Strength Assessment Site  Hip;Knee;Ankle      Ambulation/Gait   Assistive device  None    Gait Pattern  Decreased stride length;Decreased hip/knee flexion - right;Decreased hip/knee flexion - left;Poor foot clearance - left;Poor foot clearance - right;Wide base of support;Trunk flexed;Decreased arm swing - right;Decreased arm swing - left    Gait velocity  decreased - will formally assess next visit                Objective measurements completed on examination: See above findings.              PT Education - 06/22/19 1005    Education Details  PT eval findings, anticipated POC and initial HEP    Person(s) Educated  Patient;Child(ren)    Methods  Explanation;Demonstration;Verbal cues;Tactile cues;Handout    Comprehension  Verbalized understanding;Returned demonstration;Verbal cues required;Tactile cues required;Need further instruction       PT Short Term Goals - 06/22/19 1009      PT SHORT TERM GOAL #1   Title  Complete balance assessment and update POC/goals as indicated    Status  New    Target Date  07/06/19      PT SHORT TERM GOAL #2   Title  Independent with initial HEP    Status  New    Target Date  07/20/19        PT Long Term Goals - 06/22/19 1009      PT LONG TERM GOAL #1   Title  Independent with  advanced/ongoing postural & balance HEP    Status  New    Target Date  08/17/19      PT LONG TERM GOAL #2   Title  Patient to report  pain reduction in frequency and intensity by >/= 50%    Status  New    Target Date  08/17/19      PT LONG TERM GOAL #3   Title  Patient will demonstrate consistent awareness of improved upright posture >/= 75% of time    Status  New    Target Date  08/17/19      PT LONG TERM GOAL #4   Title  Patinet will demonstrate decreased thoracic kyphosis sufficient to allow forward gaze with gait    Status  New    Target Date  08/17/19             Plan - 06/22/19 1009    Clinical Impression Statement  [name] is a 86 y/o male referred to OP PT for neck and thoracic spine pain and balance problem. He reports 1-2 yr h/o intermittent neck, upper back and L shoulder/scapular pain but notes worsening kyphosis since 09-Jan-2019 after his wife passed away. He had previously been more active, going to the gym 3 days per week but had stopped due to COVID-19. He feels his stooped posture has led to increased instability with 1 fall reported in the past year. His resting posture demonstrates 80-90 dg thoracic kyphosis which is correctable to ~60dg when pushing himself up from raised surface with his arms while standing. Kyphosis has created increased muscle tightness in pectoralis and anterior shoulder musculature as well as abdominal muscles. Significant postural muscle weakness evident but extremity strength grossly WFL. Initiated postural stretching and scapular strengthening for postural support in supine with temporary notable improvement in thoracic extension which will hopefully continue to improve with further training - initial HEP provided. Patient fatigued following this, therefore formal balance assessment deferred until next visit. [name] will benefit from skilled PT to address above deficits to decrease pain interference and improve posture with walking and normal daily activities as well as decrease risk for future falls.    Personal Factors and Comorbidities  Age;Comorbidity 3+;Past/Current  Experience;Time since onset of injury/illness/exacerbation    Comorbidities  mutiple active PT problems - neck, thoracic & L shoulder pain,severe thoracic kyphosis, balance deficits; PMHx including OA, RTC tendinopathy, dizziness & giddiness with additional PMHx as above    Examination-Activity Limitations  Bed Mobility;Lift;Locomotion Level;Reach Overhead;Stand;Transfers    Examination-Participation Restrictions  Cleaning;Community Activity;Yard Work    [employer]  Evolving/Moderate complexity    Clinical Decision Making  Moderate    Rehab Potential  Good    PT Frequency  2x / week    PT Duration  8 weeks    PT Treatment/Interventions  ADLs/Self Care Home Management;Cryotherapy;Electrical Stimulation;Iontophoresis 4mg /ml Dexamethasone;Moist Heat;Ultrasound;Gait training;Stair training;Functional mobility training;Therapeutic activities;Therapeutic exercise;Balance training;Neuromuscular re-education;Patient/family education;Manual techniques;Passive range of motion;Taping;Spinal Manipulations;Joint Manipulations    PT Next Visit Plan  Complete balance assessment; review initial HEP    PT Home Exercise Plan  06/22/19 - supine pec stretch (pt to try gradually increasing firmness of adjustable mattress & then moving arms through snow angel pattern until stretch acheived), scap retraction + yellow TB horiz ABD & shoulder ER    Consulted and Agree with Plan of Care  Patient;Family member/caregiver    Family Member Consulted  daughter       Patient will benefit from skilled therapeutic intervention in order  to improve the following deficits and impairments:  Abnormal gait, Decreased activity tolerance, Decreased balance, Decreased knowledge of use of DME, Decreased mobility, Decreased range of motion, Decreased safety awareness, Decreased strength, Difficulty walking, Increased fascial restricitons, Increased muscle spasms, Impaired perceived functional ability, Impaired  flexibility, Impaired UE functional use, Improper body mechanics, Postural dysfunction, Pain  Visit Diagnosis: Cervicalgia  Pain in thoracic spine  Abnormal posture  Unsteadiness on feet  Difficulty in walking, not elsewhere classified  History of falling     Problem List Patient Active Problem List   Diagnosis Date Noted  . Finger pain, right 06/13/2019  . Neck pain 06/13/2019  . Thoracic spine pain 06/13/2019  . Height loss 06/13/2019  . Balance problem 06/13/2019  . Dyspepsia 04/09/2016  . Tendinopathy of rotator cuff 01/02/2015  . Right knee pain 09/21/2014  . Constipation 09/18/2014  . Preventative health care 04/05/2014  . Obesity (BMI 30-39.9) 05/27/2013  . Viral URI 11/21/2010  . DIZZINESS AND GIDDINESS 12/18/2009  . NONSPECIFIC ABNORMAL ELECTROCARDIOGRAM 06/27/2009  . HEMORRHOIDS 08/26/2007  . DIVERTICULOSIS, COLON 08/26/2007  . ARTHRITIS 08/26/2007  . COLONIC POLYPS 05/04/2007  . IMPAIRED GLUCOSE TOLERANCE 02/23/2007  . BPH (benign prostatic hyperplasia) 02/23/2007    Percival Spanish, PT, MPT 06/22/2019, 3:21 PM  [facility] [address]  [place], [place], [address] Phone: [phone]   Fax:  [phone]  Name: BAUER MALINSKY MRN: [id number] Date of Birth: 1922/12/21

## 2019-06-27 ENCOUNTER — Ambulatory Visit: Payer: Medicare Other | Admitting: Physical Therapy

## 2019-06-27 ENCOUNTER — Other Ambulatory Visit: Payer: Self-pay

## 2019-06-27 ENCOUNTER — Encounter: Payer: Self-pay | Admitting: Physical Therapy

## 2019-06-27 DIAGNOSIS — M546 Pain in thoracic spine: Secondary | ICD-10-CM | POA: Diagnosis not present

## 2019-06-27 DIAGNOSIS — R293 Abnormal posture: Secondary | ICD-10-CM | POA: Diagnosis not present

## 2019-06-27 DIAGNOSIS — R262 Difficulty in walking, not elsewhere classified: Secondary | ICD-10-CM | POA: Diagnosis not present

## 2019-06-27 DIAGNOSIS — Z9181 History of falling: Secondary | ICD-10-CM | POA: Diagnosis not present

## 2019-06-27 DIAGNOSIS — R2681 Unsteadiness on feet: Secondary | ICD-10-CM

## 2019-06-27 DIAGNOSIS — M542 Cervicalgia: Secondary | ICD-10-CM

## 2019-06-27 NOTE — Therapy (Signed)
[facility] [address]  [place], [place], [address] Phone: [phone]   Fax:  [phone]  Physical Therapy Treatment  Patient Details  Name: Kevin Glass MRN: [id number] Date of Birth: Nov 07, 1922 Referring Provider (PT): Roma Schanz, [place]   Encounter Date: 06/27/2019  PT End of Session - 06/27/19 1323    Visit Number  2    Number of Visits  16    Date for PT Re-Evaluation  08/17/19    Authorization Type  [employer] Medicare    PT Start Time  [id number]   Pt arrived late   PT Stop Time  1358    PT Time Calculation (min)  35 min    Activity Tolerance  Patient tolerated treatment well    Behavior During Therapy  [facility] for tasks assessed/performed       Past Medical History:  Diagnosis Date  . Arthritis   . BPH (benign prostatic hypertrophy)   . Colonic polyp   . Hemorrhoid     Past Surgical History:  Procedure Laterality Date  . HEMORRHOID SURGERY    . TOTAL KNEE ARTHROPLASTY  2003   Dr Maureen Ralphs    There were no vitals filed for this visit.  Subjective Assessment - 06/27/19 1327    Subjective  Pt reports his balance feels a little more off today - unable to be more specific other than he feels like he has to be more careful. Reports good compliance with HEP, completing exercises BID (morning & afternoon) and has been able to use adjustable firmness on mattress to promote increased thoracic extension, although still needs to use 1 pillow. Patient & dtr note he particularly likes the "snow angel" stretch.. His daughter notes she sees improvement in his ability to straighten up following the exercises but result is only temporary at present.    Patient is accompained by:  Family member   daughter   Patient Stated Goals  "I would like to be able to straighten up more so I can look ahead while walking."    Currently in Pain?  No/denies    Pain Onset  --   1-2 yrs        [facility] PT Assessment - 06/27/19 1323       Standardized Balance Assessment   Standardized Balance Assessment  Berg Balance Test;Timed Up and Go Test;10 meter walk test    10 Meter Walk  20.19 sec      Berg Balance Test   Sit to Stand  Able to stand  independently using hands    Standing Unsupported  Able to stand safely 2 minutes    Sitting with Back Unsupported but Feet Supported on Floor or Stool  Able to sit safely and securely 2 minutes    Stand to Sit  Controls descent by using hands    Transfers  Able to transfer safely, definite need of hands    Standing Unsupported with Eyes Closed  Unable to keep eyes closed 3 seconds but stays steady    Standing Unsupported with Feet Together  Able to place feet together independently and stand for 1 minute with supervision    From Standing, Reach Forward with Outstretched Arm  Can reach forward >5 cm safely (2")    From Standing Position, Pick up Object from Floor  Unable to pick up and needs supervision    From Standing Position, Turn to Look Behind Over each Shoulder  Turn sideways only but maintains  balance    Turn 360 Degrees  Able to turn 360 degrees safely but slowly    Standing Unsupported, Alternately Place Feet on Step/Stool  Needs assistance to keep from falling or unable to try    Standing Unsupported, One Foot in Front  Needs help to step but can hold 15 seconds    Standing on One Leg  Tries to lift leg/unable to hold 3 seconds but remains standing independently    Total Score  30    Berg comment:  < 36 high risk for falls (close to 100%)      Timed Up and Go Test   Normal TUG (seconds)  22.06                             PT Short Term Goals - 06/27/19 1358      PT SHORT TERM GOAL #1   Title  Complete balance assessment and update POC/goals as indicated    Status  Achieved   06/27/19     PT SHORT TERM GOAL #2   Title  Independent with initial HEP    Status  On-going    Target Date  07/20/19      PT SHORT TERM GOAL #3   Title  Patient will  increase gait speed to >/= 1.8 ft/sec with or w/o LRAD to reduce risk for recurrent falls    Status  New    Target Date  07/20/19        PT Long Term Goals - 06/27/19 1358      PT LONG TERM GOAL #1   Title  Independent with advanced/ongoing postural & balance HEP    Status  On-going    Target Date  08/17/19      PT LONG TERM GOAL #2   Title  Patient to report pain reduction in frequency and intensity by >/= 50%    Status  On-going    Target Date  08/17/19      PT LONG TERM GOAL #3   Title  Patient will demonstrate consistent awareness of improved upright posture >/= 75% of time    Status  On-going    Target Date  08/17/19      PT LONG TERM GOAL #4   Title  Patinet will demonstrate decreased thoracic kyphosis sufficient to allow forward gaze with gait    Status  On-going    Target Date  08/17/19      PT LONG TERM GOAL #5   Title  Patient will increase gait speed to >/= 2.62 ft/sec with or w/o LRAD to increase safety with community ambulation    Status  New    Target Date  08/17/19      PT LONG TERM GOAL #6   Title  Patient will increase Berg score to >/= 38/56 to reduce risk for falls    Status  New    Target Date  08/17/19      PT LONG TERM GOAL #7   Title  Patient will reduce TUG time to </= 13.5 to improve transitional and gait stability    Status  New    Target Date  08/17/19            Plan - 06/27/19 1358    Clinical Impression Statement  Kevin Glass and his daughter reporting good compliance with HEP, completing exercises BID and able to successfully use adjustable firmness on mattress to promote thoracic extension -  patient's daughter notes improved ability for patient to straighten up following exercises but results only temporary thus far. Balance assessment completed with all standardized testing indicating a high risk for falls. Patient encouraged to use AD at all times as he currently typically only uses his cane and rollator in the home, relying on his  daughter when going out.    Personal Factors and Comorbidities  Age;Comorbidity 3+;Past/Current Experience;Time since onset of injury/illness/exacerbation    Comorbidities  mutiple active PT problems - neck, thoracic & L shoulder pain,severe thoracic kyphosis, balance deficits; PMHx including OA, RTC tendinopathy, dizziness & giddiness with additional PMHx as above    Examination-Activity Limitations  Bed Mobility;Lift;Locomotion Level;Reach Overhead;Stand;Transfers    Examination-Participation Restrictions  Cleaning;Community Activity;Yard Work    [employer]  Evolving/Moderate complexity    Rehab Potential  Good    PT Frequency  2x / week    PT Duration  8 weeks    PT Treatment/Interventions  ADLs/Self Care Home Management;Cryotherapy;Electrical Stimulation;Iontophoresis 4mg /ml Dexamethasone;Moist Heat;Ultrasound;Gait training;Stair training;Functional mobility training;Therapeutic activities;Therapeutic exercise;Balance training;Neuromuscular re-education;Patient/family education;Manual techniques;Passive range of motion;Taping;Spinal Manipulations;Joint Manipulations    PT Next Visit Plan  review initial HEP; postural stretching & strengthening with emphasis on thoracic extension and cervical/scapular retraction; assess gait with cane as indicated    PT Home Exercise Plan  06/22/19 - supine pec stretch (pt to try gradually increasing firmness of adjustable mattress & then moving arms through snow angel pattern until stretch acheived), scap retraction + yellow TB horiz ABD & shoulder ER    Consulted and Agree with Plan of Care  Patient;Family member/caregiver    Family Member Consulted  daughter       Patient will benefit from skilled therapeutic intervention in order to improve the following deficits and impairments:  Abnormal gait, Decreased activity tolerance, Decreased balance, Decreased knowledge of use of DME, Decreased mobility, Decreased range of motion, Decreased  safety awareness, Decreased strength, Difficulty walking, Increased fascial restricitons, Increased muscle spasms, Impaired perceived functional ability, Impaired flexibility, Impaired UE functional use, Improper body mechanics, Postural dysfunction, Pain  Visit Diagnosis: Cervicalgia  Pain in thoracic spine  Abnormal posture  Unsteadiness on feet  Difficulty in walking, not elsewhere classified  History of falling     Problem List Patient Active Problem List   Diagnosis Date Noted  . Finger pain, right 06/13/2019  . Neck pain 06/13/2019  . Thoracic spine pain 06/13/2019  . Height loss 06/13/2019  . Balance problem 06/13/2019  . Dyspepsia 04/09/2016  . Tendinopathy of rotator cuff 01/02/2015  . Right knee pain 09/21/2014  . Constipation 09/18/2014  . Preventative health care 04/05/2014  . Obesity (BMI 30-39.9) 05/27/2013  . Viral URI 11/21/2010  . DIZZINESS AND GIDDINESS 12/18/2009  . NONSPECIFIC ABNORMAL ELECTROCARDIOGRAM 06/27/2009  . HEMORRHOIDS 08/26/2007  . DIVERTICULOSIS, COLON 08/26/2007  . ARTHRITIS 08/26/2007  . COLONIC POLYPS 05/04/2007  . IMPAIRED GLUCOSE TOLERANCE 02/23/2007  . BPH (benign prostatic hyperplasia) 02/23/2007    Percival Spanish, PT, MPT 06/27/2019, 2:31 PM  [facility] [address]  [address] [place], [place], [address] Phone: [phone]   Fax:  [phone]  Name: Kevin Glass MRN: [id number] Date of Birth: [birth year]

## 2019-06-28 DIAGNOSIS — H903 Sensorineural hearing loss, bilateral: Secondary | ICD-10-CM | POA: Diagnosis not present

## 2019-06-29 ENCOUNTER — Other Ambulatory Visit: Payer: Self-pay

## 2019-06-29 ENCOUNTER — Ambulatory Visit: Payer: Medicare Other

## 2019-06-29 DIAGNOSIS — R262 Difficulty in walking, not elsewhere classified: Secondary | ICD-10-CM

## 2019-06-29 DIAGNOSIS — M546 Pain in thoracic spine: Secondary | ICD-10-CM

## 2019-06-29 DIAGNOSIS — R293 Abnormal posture: Secondary | ICD-10-CM

## 2019-06-29 DIAGNOSIS — M542 Cervicalgia: Secondary | ICD-10-CM | POA: Diagnosis not present

## 2019-06-29 DIAGNOSIS — R2681 Unsteadiness on feet: Secondary | ICD-10-CM

## 2019-06-29 DIAGNOSIS — Z9181 History of falling: Secondary | ICD-10-CM | POA: Diagnosis not present

## 2019-06-29 NOTE — Therapy (Addendum)
[facility] [address]  [place], [place], [address] Phone: [phone]   Fax:  [phone]  Physical Therapy Treatment / Discharge Summary  Patient Details  Name: Kevin Glass MRN: [id number] Date of Birth: [birth year] Referring Provider (PT): Roma Schanz, [place]   Encounter Date: 06/29/2019  PT End of Session - 06/29/19 0953    Visit Number  3    Number of Visits  16    Date for PT Re-Evaluation  08/17/19    Authorization Type  [employer]HC Medicare    PT Start Time  0937    PT Stop Time  1015    PT Time Calculation (min)  38 min    Activity Tolerance  Patient tolerated treatment well    Behavior During Therapy  [facility] for tasks assessed/performed       Past Medical History:  Diagnosis Date  . Arthritis   . BPH (benign prostatic hypertrophy)   . Colonic polyp   . Hemorrhoid     Past Surgical History:  Procedure Laterality Date  . HEMORRHOID SURGERY    . TOTAL KNEE ARTHROPLASTY  2003   Dr Maureen Ralphs    There were no vitals filed for this visit.  Subjective Assessment - 06/29/19 1413    Subjective  Pt. reporting he continues to perform HEP 2x/day.  Denies soreness after last session.  Pt. son accompanying him into session.    Patient is accompained by:  Family member   son   Patient Stated Goals  "I would like to be able to straighten up more so I can look ahead while walking."    Currently in Pain?  No/denies    Pain Score  0-No pain    Multiple Pain Sites  No                       [facility] Adult PT Treatment/Exercise - 06/29/19 0001      Neck Exercises: Theraband   Shoulder External Rotation  10 reps   table elevated to 30 dg positioning    Shoulder External Rotation Limitations  yellow TB   tactile cueing to ensure scapular retraction    Horizontal ABduction  10 reps   table elevated to 30 dg positioning    Horizontal ABduction Limitations  yellow TB   tactile cueing to ensure  scapular retraction      Neck Exercises: Seated   Other Seated Exercise  Seated scap. retraction with therapist manual guidance 5" x 10 reps     Other Seated Exercise  upright sitting posture + scap. retraction + cervical extension 3" x 10       Neck Exercises: Supine   Neck Retraction  10 reps;5 secs    Neck Retraction Limitations  2 pillows; table elevated into 30 dg head elevated positioning       Neck Exercises: Stretches   Corner Stretch  1 rep;20 seconds   terminated as pt. noting ex "threw off my equilibriumGeophysical data processor Limitations  low in doorway                PT Short Term Goals - 06/29/19 1414      PT SHORT TERM GOAL #1   Title  Complete balance assessment and update POC/goals as indicated    Status  Achieved   06/27/19     PT SHORT TERM GOAL #2   Title  Independent with initial HEP  Status  On-going    Target Date  07/20/19      PT SHORT TERM GOAL #3   Title  Patient will increase gait speed to >/= 1.8 ft/sec with or w/o LRAD to reduce risk for recurrent falls    Status  On-going    Target Date  07/20/19        PT Long Term Goals - 06/29/19 1415      PT LONG TERM GOAL #1   Title  Independent with advanced/ongoing postural & balance HEP    Status  On-going      PT LONG TERM GOAL #2   Title  Patient to report pain reduction in frequency and intensity by >/= 50%    Status  On-going      PT LONG TERM GOAL #3   Title  Patient will demonstrate consistent awareness of improved upright posture >/= 75% of time    Status  On-going      PT LONG TERM GOAL #4   Title  Patinet will demonstrate decreased thoracic kyphosis sufficient to allow forward gaze with gait    Status  On-going      PT LONG TERM GOAL #5   Title  Patient will increase gait speed to >/= 2.62 ft/sec with or w/o LRAD to increase safety with community ambulation    Status  On-going      PT LONG TERM GOAL #6   Title  Patient will increase Berg score to >/= 38/56 to reduce risk  for falls    Status  On-going      PT LONG TERM GOAL #7   Title  Patient will reduce TUG time to </= 13.5 to improve transitional and gait stability    Status  On-going            Plan - 06/29/19 1415    Clinical Impression Statement  [name] reporting he is performing HEP 2x/day without complaints.  Pt. reporting he was pain free throughout session today.  Tolerated gentle postural strengthening including chin tuck/scap retraction, yellow TB horizontal abduction, yellow TB external shoulder rotation well today.  all band exercises performed today on ~ 30 dg head elevated table position to promote improved postural alignment along with two pillows for cervical support with good tolerance.  Attempted gentle low doorway pec stretch in standing today however terminated after pt. reporting this activity "threw off his equilibrium".  Pt. denied acute dizziness/lightheadedness in session.  Will continue to progress postural exercises per pt. tolerance in coming sessions.    Comorbidities  mutiple active PT problems - neck, thoracic & L shoulder pain,severe thoracic kyphosis, balance deficits; PMHx including OA, RTC tendinopathy, dizziness & giddiness with additional PMHx as above    Rehab Potential  Good    PT Treatment/Interventions  ADLs/Self Care Home Management;Cryotherapy;Electrical Stimulation;Iontophoresis 35m/ml Dexamethasone;Moist Heat;Ultrasound;Gait training;Stair training;Functional mobility training;Therapeutic activities;Therapeutic exercise;Balance training;Neuromuscular re-education;Patient/family education;Manual techniques;Passive range of motion;Taping;Spinal Manipulations;Joint Manipulations    PT Next Visit Plan  Postural stretching & strengthening with emphasis on thoracic extension and cervical/scapular retraction; assess gait with cane as indicated    PT Home Exercise Plan  06/22/19 - supine pec stretch (pt to try gradually increasing firmness of adjustable mattress & then moving  arms through snow angel pattern until stretch acheived), scap retraction + yellow TB horiz ABD & shoulder ER    Consulted and Agree with Plan of Care  Patient;Family member/caregiver    Family Member Consulted  son       Patient  will benefit from skilled therapeutic intervention in order to improve the following deficits and impairments:  Abnormal gait, Decreased activity tolerance, Decreased balance, Decreased knowledge of use of DME, Decreased mobility, Decreased range of motion, Decreased safety awareness, Decreased strength, Difficulty walking, Increased fascial restricitons, Increased muscle spasms, Impaired perceived functional ability, Impaired flexibility, Impaired UE functional use, Improper body mechanics, Postural dysfunction, Pain  Visit Diagnosis: Cervicalgia  Pain in thoracic spine  Abnormal posture  Unsteadiness on feet  Difficulty in walking, not elsewhere classified  History of falling     Problem List Patient Active Problem List   Diagnosis Date Noted  . Finger pain, right 06/13/2019  . Neck pain 06/13/2019  . Thoracic spine pain 06/13/2019  . Height loss 06/13/2019  . Balance problem 06/13/2019  . Dyspepsia 04/09/2016  . Tendinopathy of rotator cuff 01/02/2015  . Right knee pain 09/21/2014  . Constipation 09/18/2014  . Preventative health care 04/05/2014  . Obesity (BMI 30-39.9) 05/27/2013  . Viral URI 11/21/2010  . DIZZINESS AND GIDDINESS 12/18/2009  . NONSPECIFIC ABNORMAL ELECTROCARDIOGRAM 06/27/2009  . HEMORRHOIDS 08/26/2007  . DIVERTICULOSIS, COLON 08/26/2007  . ARTHRITIS 08/26/2007  . COLONIC POLYPS 05/04/2007  . IMPAIRED GLUCOSE TOLERANCE 02/23/2007  . BPH (benign prostatic hyperplasia) 02/23/2007    Bess Harvest, PTA 06/29/19 2:25 PM   [facility] [address]  [place], [place], [address] Phone: [phone]   Fax:  [phone]  Name: JCION BUDDENHAGEN MRN:  [id number] Date of Birth: 1923/05/31  PHYSICAL THERAPY DISCHARGE SUMMARY  Visits from Start of Care: 3  Current functional level related to goals / functional outcomes:   Refer to above clinical impression for status as of last visit on 06/29/2019. Patient's daughter cancelled all further scheduled visits due to patient not feeling well and patient has not returned to PT in >30 days, therefore will proceed with discharge from PT for this episode.   Remaining deficits:   As above. Unable to formally assess status at discharge due to failure to return to PT.   Education / Equipment:   HEP  Plan: Patient agrees to discharge.  Patient goals were not met. Patient is being discharged due to not returning since the last visit.  ?????     Percival Spanish, PT, MPT 08/26/19, 8:44 AM  [facility] [place], [place], [address] Phone: [phone]   Fax:  [phone]

## 2019-07-04 ENCOUNTER — Ambulatory Visit: Payer: Medicare Other | Admitting: Physical Therapy

## 2019-07-05 DIAGNOSIS — M79644 Pain in right finger(s): Secondary | ICD-10-CM | POA: Diagnosis not present

## 2019-07-05 DIAGNOSIS — M65341 Trigger finger, right ring finger: Secondary | ICD-10-CM | POA: Diagnosis not present

## 2019-07-06 ENCOUNTER — Encounter: Payer: Self-pay | Admitting: Family Medicine

## 2019-07-09 ENCOUNTER — Other Ambulatory Visit: Payer: Self-pay | Admitting: Family Medicine

## 2019-07-11 ENCOUNTER — Ambulatory Visit: Payer: Medicare Other | Admitting: Physical Therapy

## 2019-07-20 ENCOUNTER — Encounter: Payer: Medicare Other | Admitting: Physical Therapy

## 2019-07-20 DIAGNOSIS — L821 Other seborrheic keratosis: Secondary | ICD-10-CM | POA: Diagnosis not present

## 2019-07-20 DIAGNOSIS — L570 Actinic keratosis: Secondary | ICD-10-CM | POA: Diagnosis not present

## 2019-07-27 ENCOUNTER — Encounter: Payer: Medicare Other | Admitting: Physical Therapy

## 2019-08-03 ENCOUNTER — Encounter: Payer: Medicare Other | Admitting: Physical Therapy

## 2019-08-16 DIAGNOSIS — M65341 Trigger finger, right ring finger: Secondary | ICD-10-CM | POA: Diagnosis not present

## 2019-08-25 ENCOUNTER — Encounter: Payer: Self-pay | Admitting: Family Medicine

## 2019-09-09 ENCOUNTER — Other Ambulatory Visit: Payer: Self-pay

## 2019-09-09 ENCOUNTER — Ambulatory Visit (INDEPENDENT_AMBULATORY_CARE_PROVIDER_SITE_OTHER): Payer: Medicare Other | Admitting: Family Medicine

## 2019-09-09 ENCOUNTER — Encounter: Payer: Self-pay | Admitting: Family Medicine

## 2019-09-09 VITALS — BP 118/88 | HR 47 | Temp 97.6°F | Resp 18 | Ht 69.0 in | Wt 191.4 lb

## 2019-09-09 DIAGNOSIS — R32 Unspecified urinary incontinence: Secondary | ICD-10-CM

## 2019-09-09 LAB — POC URINALSYSI DIPSTICK (AUTOMATED)
Blood, UA: NEGATIVE
Glucose, UA: NEGATIVE
Ketones, UA: NEGATIVE
Leukocytes, UA: NEGATIVE
Nitrite, UA: NEGATIVE
Protein, UA: POSITIVE — AB
Spec Grav, UA: 1.025 (ref 1.010–1.025)
Urobilinogen, UA: 1 E.U./dL
pH, UA: 5.5 (ref 5.0–8.0)

## 2019-09-09 LAB — PSA: PSA: 0.71 ng/mL (ref 0.10–4.00)

## 2019-09-09 NOTE — Progress Notes (Signed)
Patient ID: Kevin Glass, male    DOB: Oct 31, 1922  Age: 86 y.o. MRN: [id number]    Subjective:  Subjective  HPI Kevin Glass presents for c/o urinary incontinence.  He wears diapers and has had a few accidents but not many.   He really does not want to take more meds.    Review of Systems  Constitutional: Negative for appetite change, diaphoresis, fatigue and unexpected weight change.  Eyes: Negative for pain, redness and visual disturbance.  Respiratory: Negative for cough, chest tightness, shortness of breath and wheezing.   Cardiovascular: Negative for chest pain, palpitations and leg swelling.  Endocrine: Negative for cold intolerance, heat intolerance, polydipsia, polyphagia and polyuria.  Genitourinary: Positive for frequency. Negative for decreased urine volume, difficulty urinating, dysuria, testicular pain and urgency.  Neurological: Negative for dizziness, light-headedness, numbness and headaches.    History Past Medical History:  Diagnosis Date  . Arthritis   . BPH (benign prostatic hypertrophy)   . Colonic polyp   . Hemorrhoid     He has a past surgical history that includes Total knee arthroplasty (2003) and Hemorrhoid surgery.   His family history includes Arthritis in his sister; Cancer in his mother.He reports that he quit smoking about 36 years ago. He has never used smokeless tobacco. He reports that he does not drink alcohol or use drugs.  Current Outpatient Medications on File Prior to Visit  Medication Sig Dispense Refill  . Ascorbic Acid (VITAMIN C) 500 MG tablet Take 1,000 mg by mouth daily.     [name] aspirin 81 MG tablet Take 81 mg by mouth every other day.    [name] azelastine (ASTELIN) 0.1 % nasal spray Place 2 sprays into both nostrils 2 (two) times daily. 30 mL 6  . Cholecalciferol (VITAMIN D) 2000 UNITS CAPS Take 1 capsule by mouth daily.    [name] lactulose (CHRONULAC) 10 GM/15ML solution Take 30 mLs (20 g total) by mouth 2 (two) times daily as needed for  moderate constipation. 1800 mL 1  . Omega-3 Fatty Acids (FISH OIL) 1200 MG CAPS Take by mouth.    [name] omeprazole (PRILOSEC) 20 MG capsule TAKE 1 CAPSULE BY MOUTH  DAILY 90 capsule 3  . Saw Palmetto 500 MG CAPS Take 1 capsule by mouth every other day.      No current facility-administered medications on file prior to visit.     Objective:  Objective  Physical Exam Vitals and nursing note reviewed.  Constitutional:      General: He is sleeping.     Appearance: He is well-developed.  HENT:     Head: Normocephalic and atraumatic.  Eyes:     Pupils: Pupils are equal, round, and reactive to light.  Neck:     Thyroid: No thyromegaly.  Cardiovascular:     Rate and Rhythm: Normal rate and regular rhythm.     Heart sounds: No murmur.  Pulmonary:     Effort: Pulmonary effort is normal. No respiratory distress.     Breath sounds: Normal breath sounds. No wheezing or rales.  Chest:     Chest wall: No tenderness.  Musculoskeletal:        General: No tenderness.     Cervical back: Normal range of motion and neck supple.  Skin:    General: Skin is warm and dry.  Neurological:     Mental Status: He is oriented to person, place, and time.  Psychiatric:        Behavior: Behavior normal.  Thought Content: Thought content normal.        Judgment: Judgment normal.    BP 118/88 (BP Location: Left Arm, Patient Position: Sitting, Cuff Size: Normal)   Pulse (!) 47   Temp 97.6 F (36.4 C) (Temporal)   Resp 18   Ht 5\' 9"  (1.753 m)   Wt 191 lb 6.4 oz (86.8 kg)   SpO2 97%   BMI 28.26 kg/m  Wt Readings from Last 3 Encounters:  09/09/19 191 lb 6.4 oz (86.8 kg)  06/13/19 199 lb (90.3 kg)  07/28/18 199 lb 9.6 oz (90.5 kg)     Lab Results  Component Value Date   WBC 5.9 12/11/2017   HGB 16.2 12/11/2017   HCT 47.1 12/11/2017   PLT 168.0 12/11/2017   GLUCOSE 97 12/11/2017   CHOL 186 12/11/2017   TRIG 107.0 12/11/2017   HDL 43.40 12/11/2017   LDLCALC 121 (H) 12/11/2017   ALT 10  12/11/2017   AST 15 12/11/2017   NA 138 12/11/2017   K 5.0 12/11/2017   CL 100 12/11/2017   CREATININE 0.79 12/11/2017   BUN 13 12/11/2017   CO2 31 12/11/2017   TSH 2.15 10/17/2014   PSA 1.03 12/11/2017   HGBA1C 5.7 12/11/2017    DG Cervical Spine Complete  Result Date: 06/14/2019 CLINICAL DATA:  Pain unable to straighten EXAM: CERVICAL SPINE - COMPLETE 4+ VIEW COMPARISON:  None. FINDINGS: Dens and lateral masses are poorly evaluated. Suboptimal visualization C7 and below. Trace anterolisthesis C4 on C5. Mild diffuse degenerative change C3 through C6. Diffuse posterior facet degenerative change with probable fusion of posterior elements at C3 and C4. IMPRESSION: Inadequate evaluation C7 and below. Dens and lateral masses are poorly evaluated. Trace anterolisthesis C4 on C5 with mild diffuse degenerative changes. Electronically Signed   By: Donavan Foil M.D.   On: 06/14/2019 02:11   DG Thoracic Spine W/Swimmers  Result Date: 06/14/2019 CLINICAL DATA:  Back pain EXAM: THORACIC SPINE - 3 VIEWS COMPARISON:  11/26/2011 FINDINGS: Thoracic alignment is within normal limits. The vertebral body heights are maintained. Mild diffuse degenerative changes with osteophyte. IMPRESSION: Mild diffuse degenerative changes.  No acute osseous abnormality. Electronically Signed   By: Donavan Foil M.D.   On: 06/14/2019 02:06     Assessment & Plan:  Plan  I am having Denzil Magnuson "Stan" maintain his vitamin C, Saw Palmetto, Vitamin D, aspirin, Fish Oil, lactulose, azelastine, and omeprazole.  No orders of the defined types were placed in this encounter.   Problem List Items Addressed This Visit    None    Visit Diagnoses    Urinary incontinence, unspecified type    -  Primary   Relevant Orders   POCT Urinalysis Dipstick (Automated) (Completed)   Urine Culture   PSA    pt did not want medication  Check labs--- if psa elevated may need urology referral  If symptoms worsen---he will  reconsider meds   Follow-up: Return in about 3 months (around 12/10/2019), or if symptoms worsen or fail to improve, for annual exam, fasting.  Ann Held, DO

## 2019-09-09 NOTE — Patient Instructions (Signed)
Urinary Frequency, Adult Urinary frequency means urinating more often than usual. You may urinate every 1-2 hours even though you drink a normal amount of fluid and do not have a bladder infection or condition. Although you urinate more often than normal, the total amount of urine produced in a day is normal. With urinary frequency, you may have an urgent need to urinate often. The stress and anxiety of needing to find a bathroom quickly can make this urge worse. This condition may go away on its own or you may need treatment at home. Home treatment may include bladder training, exercises, taking medicines, or making changes to your diet. Follow these instructions at home: Bladder health   Keep a bladder diary if told by your health care provider. Keep track of: ? What you eat and drink. ? How often you urinate. ? How much you urinate.  Follow a bladder training program if told by your health care provider. This may include: ? Learning to delay going to the bathroom. ? Double urinating (voiding). This helps if you are not completely emptying your bladder. ? Scheduled voiding.  Do Kegel exercises as told by your health care provider. Kegel exercises strengthen the muscles that help control urination, which may help the condition. Eating and drinking  If told by your health care provider, make diet changes, such as: ? Avoiding caffeine. ? Drinking fewer fluids, especially alcohol. ? Not drinking in the evening. ? Avoiding foods or drinks that may irritate the bladder. These include coffee, tea, soda, artificial sweeteners, citrus, tomato-based foods, and chocolate. ? Eating foods that help prevent or ease constipation. Constipation can make this condition worse. Your health care provider may recommend that you:  Drink enough fluid to keep your urine pale yellow.  Take over-the-counter or prescription medicines.  Eat foods that are high in fiber, such as beans, whole grains, and fresh  fruits and vegetables.  Limit foods that are high in fat and processed sugars, such as fried or sweet foods. General instructions  Take over-the-counter and prescription medicines only as told by your health care provider.  Keep all follow-up visits as told by your health care provider. This is important. Contact a health care provider if:  You start urinating more often.  You feel pain or irritation when you urinate.  You notice blood in your urine.  Your urine looks cloudy.  You develop a fever.  You begin vomiting. Get help right away if:  You are unable to urinate. Summary  Urinary frequency means urinating more often than usual. With urinary frequency, you may urinate every 1-2 hours even though you drink a normal amount of fluid and do not have a bladder infection or other bladder condition.  Your health care provider may recommend that you keep a bladder diary, follow a bladder training program, or make dietary changes.  If told by your health care provider, do Kegel exercises to strengthen the muscles that help control urination.  Take over-the-counter and prescription medicines only as told by your health care provider.  Contact a health care provider if your symptoms do not improve or get worse. This information is not intended to replace advice given to you by your health care provider. Make sure you discuss any questions you have with your health care provider. Document Revised: 12/10/2017 Document Reviewed: 12/10/2017 Elsevier Patient Education  2020 Elsevier Inc.  

## 2019-09-10 LAB — URINE CULTURE
MICRO NUMBER:: 10297051
SPECIMEN QUALITY:: ADEQUATE

## 2019-12-23 NOTE — Progress Notes (Signed)
Subjective:   Kevin Glass is a 86 y.o. male who presents for Medicare Annual/Subsequent preventive examination. Pt is accompanied by son.   Review of Systems     Cardiac Risk Factors include: advanced age (>55men, >87 women)     Objective:    Today's Vitals   12/26/19 1449  BP: (!) 106/57  Pulse: 94  Temp: (!) 97 F (36.1 C)  TempSrc: Temporal  SpO2: 97%  Weight: 191 lb 3.2 oz (86.7 kg)  Height: 5\' 9"  (1.753 m)   Body mass index is 28.24 kg/m.  Advanced Directives 12/26/2019 06/22/2019 06/06/2018 12/08/2016 11/06/2015 10/26/2015 10/25/2015  Does Patient Have a Medical Advance Directive? Yes Yes Yes Yes Yes Yes Yes  Type of Paramedic of [place];Living will [place];Living will - Living will [place];Living will [place];Living will [place];Living will  Does patient want to make changes to medical advance directive? No - Patient declined No - Patient declined - - No - Patient declined No - Patient declined No - Patient declined  Copy of [place] in Chart? No - copy requested Yes - validated most recent copy scanned in chart (See row information) - - - No - copy requested No - copy requested  Would patient like information on creating a medical advance directive? - - - - - - -    Current Medications (verified) Outpatient Encounter Medications as of 12/26/2019  Medication Sig  . Ascorbic Acid (VITAMIN C) 500 MG tablet Take 1,000 mg by mouth daily.   [name] aspirin 81 MG tablet Take 81 mg by mouth every other day.  [name] azelastine (ASTELIN) 0.1 % nasal spray Place 2 sprays into both nostrils 2 (two) times daily.  . Cholecalciferol (VITAMIN D) 2000 UNITS CAPS Take 1 capsule by mouth daily.  . Omega-3 Fatty Acids (FISH OIL) 1200 MG CAPS Take by mouth.  [name] omeprazole (PRILOSEC) 20 MG capsule TAKE 1 CAPSULE BY MOUTH  DAILY  . Saw Palmetto 500 MG CAPS Take 1 capsule by  mouth every other day.   . lactulose (CHRONULAC) 10 GM/15ML solution Take 30 mLs (20 g total) by mouth 2 (two) times daily as needed for moderate constipation. (Patient not taking: Reported on 12/26/2019)   No facility-administered encounter medications on file as of 12/26/2019.    Allergies (verified) Penicillins   History: Past Medical History:  Diagnosis Date  . Arthritis   . BPH (benign prostatic hypertrophy)   . Colonic polyp   . Hemorrhoid    Past Surgical History:  Procedure Laterality Date  . HEMORRHOID SURGERY    . TOTAL KNEE ARTHROPLASTY  2003   Dr Maureen Ralphs   Family History  Problem Relation Age of Onset  . Cancer Mother   . Arthritis Sister    Social History   Socioeconomic History  . Marital status: Married    Spouse name: Not on file  . Number of children: Not on file  . Years of education: Not on file  . Highest education level: Not on file  Occupational History  . Occupation: retired  Tobacco Use  . Smoking status: Former Smoker    Quit date: 06/17/1983    Years since quitting: 36.5  . Smokeless tobacco: Never Used  Substance and Sexual Activity  . Alcohol use: No    Alcohol/week: 0.0 standard drinks  . Drug use: No  . Sexual activity: Yes    Partners: Female  Other  Topics Concern  . Not on file  Social History Narrative   Exercise--[employer] twice a week and walks dog   Social Determinants of Health   Financial Resource Strain: Low Risk   . Difficulty of Paying Living Expenses: Not hard at all  Food Insecurity: No Food Insecurity  . Worried About Charity fundraiser in the Last Year: Never true  . Ran Out of Food in the Last Year: Never true  Transportation Needs: No Transportation Needs  . Lack of Transportation (Medical): No  . Lack of Transportation (Non-Medical): No  Physical Activity:   . Days of Exercise per Week:   . Minutes of Exercise per Session:   Stress:   . Feeling of Stress :   Social Connections:   . Frequency of  Communication with Friends and Family:   . Frequency of Social Gatherings with Friends and Family:   . Attends Religious Services:   . Active Member of Clubs or Organizations:   . Attends Archivist Meetings:   [name] Marital Status:     Tobacco Counseling Counseling given: Not Answered   Clinical Intake: Pain : No/denies pain    Activities of Daily Living In your present state of health, do you have any difficulty performing the following activities: 12/26/2019  Hearing? N  Vision? N  Difficulty concentrating or making decisions? N  Walking or climbing stairs? N  Dressing or bathing? N  Doing errands, shopping? Y  Preparing Food and eating ? N  Using the Toilet? N  In the past six months, have you accidently leaked urine? N  Do you have problems with loss of bowel control? N  Managing your Medications? N  Managing your Finances? N  Housekeeping or managing your Housekeeping? N  Some recent data might be hidden    Patient Care Team: [name], [name], DO as PCP - General (Family Medicine) Inda Castle, MD (Inactive) as Consulting Physician (Gastroenterology) Mirna Mires, DDS as Consulting Physician (Dentistry)  Indicate any recent Medical Services you may have received from other than [facility] providers in the past year (date may be approximate).     Assessment:   This is a routine wellness examination for [employer].  Dietary issues and exercise activities discussed: Current Exercise Habits: Home exercise routine, Type of exercise: walking, Time (Minutes): 10, Frequency (Times/Week): 7, Weekly Exercise (Minutes/Week): 70, Intensity: Mild, Exercise limited by: None identified Diet (meal preparation, eat out, water intake, caffeinated beverages, dairy products, fruits and vegetables): well balanced    Goals    . Maintain healthy lifestlye      Depression Screen PHQ 2/9 Scores 12/26/2019 12/11/2017 12/08/2016 10/25/2015 10/25/2015 04/05/2014 05/27/2013  PHQ - 2  Score 0 0 0 0 0 0 0  Exception Documentation - - - - Patient refusal - -    Fall Risk Fall Risk  12/26/2019 12/11/2017 12/08/2016 10/25/2015 10/25/2015  Falls in the past year? 0 No Yes No No  Number falls in past yr: 0 - 1 - -  Injury with Fall? 0 - - - -  Follow up Education provided;Falls prevention discussed - - - -   Lives with dtr in 1 story condo.  Any stairs in or around the home? No  If so, are there any without handrails? No  Home free of loose throw rugs in walkways, pet beds, electrical cords, etc? Yes  Adequate lighting in your home to reduce risk of falls? Yes   ASSISTIVE DEVICES UTILIZED TO PREVENT FALLS:  Life alert? No  Use of a cane, walker or w/c? Yes  Grab bars in the bathroom? Yes  Shower chair or bench in shower? No  Elevated toilet seat or a handicapped toilet? Yes   TIMED UP AND GO:  Was the test performed? Yes .  Length of time to ambulate 10 feet: 10 sec.   Gait steady and fast with assistive device  Cognitive Function:      6CIT Screen 12/26/2019  What Year? 0 points  What month? 0 points  What time? 0 points  Count back from 20 2 points  Months in reverse 0 points  Repeat phrase 6 points  Total Score 8    Immunizations Immunization History  Administered Date(s) Administered  . Fluad Quad(high Dose 65+) 03/11/2019  . Influenza, High Dose Seasonal PF 03/24/2016, 04/10/2017, 03/12/2018  . Influenza,inj,Quad PF,6+ Mos 05/27/2013, 04/05/2014, 03/07/2015  . Pneumococcal Conjugate-13 04/08/2016  . Pneumococcal Polysaccharide-23 01/18/2008  . Td 07/31/2004  . Tdap 07/31/2004  . Zoster 06/25/2005  . Zoster Recombinat (Shingrix) 04/19/2018    TDAP status: Due, Education has been provided regarding the importance of this vaccine. Advised may receive this vaccine at local pharmacy or Health Dept. Aware to provide a copy of the vaccination record if obtained from local pharmacy or Health Dept. Verbalized acceptance and understanding. Pneumococcal  vaccine status: Up to date Covid-19 vaccine status: Declined, Education has been provided regarding the importance of this vaccine but patient still declined. Advised may receive this vaccine at local pharmacy or Health Dept.or vaccine clinic. Aware to provide a copy of the vaccination record if obtained from local pharmacy or Health Dept. Verbalized acceptance and understanding.  Qualifies for Shingles Vaccine? Yes   Zostavax completed Yes     Screening Tests Health Maintenance  Topic Date Due  . COVID-19 Vaccine (1) 01/11/2020 (Originally 09/29/1934)  . TETANUS/TDAP  12/25/2020 (Originally 07/31/2014)  . INFLUENZA VACCINE  01/15/2020  . PNA vac Low Risk Adult  Completed    Health Maintenance  There are no preventive care reminders to display for this patient.  Colorectal cancer screening: No longer required.   Lung Cancer Screening: (Low Dose CT Chest recommended if Age 81-80 years, 30 pack-year currently smoking OR have quit w/in 15years.) does not qualify.     Additional Screening:  Vision Screening: Recommended annual ophthalmology exams for early detection of glaucoma and other disorders of the eye. Is the patient up to date with their annual eye exam?  yes per pt. Who is the provider or what is the name of the office in which the patient attends annual eye exams? Dr.Gould   Dental Screening: Recommended annual dental exams for proper oral hygiene  Community Resource Referral / Chronic Care Management: CRR required this visit?  No   CCM required this visit?  No      Plan:    Please schedule your next medicare wellness visit with me in 1 yr.  Continue to eat heart healthy diet (full of fruits, vegetables, whole grains, lean protein, water--limit salt, fat, and sugar intake) and increase physical activity as tolerated.  Continue doing brain stimulating activities (puzzles, reading, adult coloring books, staying active) to keep memory sharp.   Bring a copy of your  living will and/or healthcare power of attorney to your next office visit.   I have personally reviewed and noted the following in the patient's chart:   . Medical and social history . Use of alcohol, tobacco or illicit drugs  . Current  medications and supplements . Functional ability and status . Nutritional status . Physical activity . Advanced directives . List of other physicians . Hospitalizations, surgeries, and ER visits in previous 12 months . Vitals . Screenings to include cognitive, depression, and falls . Referrals and appointments  In addition, I have reviewed and discussed with patient certain preventive protocols, quality metrics, and best practice recommendations. A written personalized care plan for preventive services as well as general preventive health recommendations were provided to patient.     Naaman Plummer McBride, [place]   12/26/2019

## 2019-12-26 ENCOUNTER — Encounter: Payer: Self-pay | Admitting: *Deleted

## 2019-12-26 ENCOUNTER — Other Ambulatory Visit: Payer: Self-pay

## 2019-12-26 ENCOUNTER — Encounter: Payer: Self-pay | Admitting: Family Medicine

## 2019-12-26 ENCOUNTER — Ambulatory Visit (INDEPENDENT_AMBULATORY_CARE_PROVIDER_SITE_OTHER): Payer: Medicare Other | Admitting: *Deleted

## 2019-12-26 ENCOUNTER — Ambulatory Visit (INDEPENDENT_AMBULATORY_CARE_PROVIDER_SITE_OTHER): Payer: Medicare Other | Admitting: Family Medicine

## 2019-12-26 VITALS — BP 106/57 | HR 94 | Temp 97.0°F | Ht 69.0 in | Wt 191.2 lb

## 2019-12-26 DIAGNOSIS — L89311 Pressure ulcer of right buttock, stage 1: Secondary | ICD-10-CM | POA: Insufficient documentation

## 2019-12-26 DIAGNOSIS — R350 Frequency of micturition: Secondary | ICD-10-CM

## 2019-12-26 DIAGNOSIS — K219 Gastro-esophageal reflux disease without esophagitis: Secondary | ICD-10-CM

## 2019-12-26 DIAGNOSIS — Z0000 Encounter for general adult medical examination without abnormal findings: Secondary | ICD-10-CM | POA: Diagnosis not present

## 2019-12-26 HISTORY — DX: Frequency of micturition: R35.0

## 2019-12-26 HISTORY — DX: Gastro-esophageal reflux disease without esophagitis: K21.9

## 2019-12-26 HISTORY — DX: Pressure ulcer of right buttock, stage 1: L89.311

## 2019-12-26 LAB — POC URINALSYSI DIPSTICK (AUTOMATED)
Bilirubin, UA: NEGATIVE
Glucose, UA: NEGATIVE
Ketones, UA: NEGATIVE
Leukocytes, UA: NEGATIVE
Nitrite, UA: NEGATIVE
Protein, UA: NEGATIVE
Spec Grav, UA: 1.025 (ref 1.010–1.025)
Urobilinogen, UA: 1 E.U./dL
pH, UA: 5.5 (ref 5.0–8.0)

## 2019-12-26 MED ORDER — DOXYCYCLINE HYCLATE 100 MG PO TABS: 100.0000 mg | ORAL_TABLET | Freq: Two times a day (BID) | ORAL | 0 refills | Status: DC

## 2019-12-26 MED ORDER — OMEPRAZOLE 40 MG PO CPDR: 40.0000 mg | DELAYED_RELEASE_CAPSULE | Freq: Every day | ORAL | 1 refills | Status: DC

## 2019-12-26 NOTE — Patient Instructions (Signed)
Preventing Pressure Injuries  A pressure injury, sometimes called a bedsore or a pressure ulcer, is an injury to the skin and underlying tissue caused by pressure. A pressure injury can happen when your skin presses against a surface, such as a mattress or wheelchair seat, for too long. The pressure on the blood vessels causes reduced blood flow to your skin. This can eventually cause the skin tissue to die and break down into a wound. Pressure injuries usually develop:  Over bony parts of the body, such as the tailbone, shoulders, elbows, hips, and heels.  Under medical devices, such as respiratory equipment, stockings, tubes, and splints. How can this condition affect me? Pressure injuries are caused by a lack of blood supply to an area of skin. These injuries begin as a reddened area on the skin and can become an open sore. They can result from intense pressure over a short period of time or from less pressure over a long period of time. Pressure injuries can vary in severity. They can cause pain, muscle damage, and infection. What can increase my risk? This condition is more likely to develop in people who:  Are in the hospital or an extended care facility.  Are bedridden or in a wheelchair.  Have an injury or disease that keeps them from: ? Moving normally. ? Feeling pain or pressure. ? Communicating if they feel pain or pressure.  Have a condition that: ? Makes them sleepy or less alert. ? Causes poor blood flow.  Need to wear a medical device.  Have poor control of their bladder or bowel functions (incontinence).  Have poor nutrition (malnutrition).  Have had this condition before.  Are of certain ethnicities. People of African American, Latino, or Hispanic descent are at higher risk compared to other ethnic groups. What actions can I take to prevent pressure injuries? Reducing and redistributing pressure  Do not lie or sit in one position for a long time. Move or change  position: ? Every hour when out of bed in a chair. ? Every two hours when in bed. ? As often as told by your health care provider.  Use pillows, wedges, or cushions to redistribute pressure. Ask your health care provider to recommend a mattress, cushions, or pads for you.  Use medical devices that do not rub your skin. Tell your health care provider if one of your medical devices is causing pain or irritation. Skin care If you are in the hospital, your health care providers:  Will inspect your skin, including areas under or around medical devices, at least twice a day.  May recommend that you use certain types of bedding to help prevent pressure injuries. These may include a pad, mattress, or chair cushion that is filled with gel, air, water, or foam.  Will evaluate your nutrition and consult a dietitian if needed.  Will inspect and change any wound dressings regularly.  May help you move into different positions every few hours.  Will adjust any medical devices and braces as needed to limit pressure on your skin.  Will keep your skin clean and dry.  May use gentle cleansers and skin protectants if you are incontinent.  Will moisturize any dry skin. In general, at home:  Keep your skin clean and dry. Gently pat your skin dry.  Do not rub or massage bony areas of your skin.  Moisturize dry skin.  Use gentle cleansers and skin protectants routinely if you are incontinent.  Check your skin at least once   a day for any changes in color and for any new blisters or sores. Make sure to check under and around any medical devices and between skin folds. Have a caregiver do this for you if you are not able.  Lifestyle  Be as active as you can every day. Ask your health care provider to suggest safe exercises or activities.  Do not abuse drugs or alcohol.  Do not use any products that contain nicotine or tobacco, such as cigarettes, e-cigarettes, and chewing tobacco. If you need  help quitting, ask your health care provider. General instructions   Take over-the-counter and prescription medicines only as told by your health care provider.  Work with your health care provider to manage any chronic health conditions.  Eat a healthy diet that includes protein, vitamins, and minerals. Ask your health care provider what types of food you should eat.  Drink enough fluid to keep your urine pale yellow.  Keep all follow-up visits as told by your health care provider. This is important. Contact a health care provider if you:  Feel or see any changes in your skin. Summary  A pressure injury, sometimes called a bedsore or a pressure ulcer, is an injury to the skin and underlying tissue caused by pressure.  Do not lie or sit in one position for a long time.  Check your skin at least once a day for any changes in color and for any new blisters or sores.  Make sure to check under and around any medical devices and between skin folds. Have a caregiver do this for you if you are not able.  Eat a healthy diet that includes protein, vitamins, and minerals. Ask your health care provider what types of food you should eat. This information is not intended to replace advice given to you by your health care provider. Make sure you discuss any questions you have with your health care provider. Document Revised: 09/24/2018 Document Reviewed: 02/23/2018 Elsevier Patient Education  2020 [employer] Inc.  

## 2019-12-26 NOTE — Progress Notes (Signed)
Patient ID: Kevin Glass, male    DOB: 1922-11-30  Age: 86 y.o. MRN: [id number]    Subjective:  Subjective  HPI Kevin Glass presents for sore on his bottom x 90 days  and has been treating it with equate antibacterial ointment with little relief.  It is more red now per his son.   He also c/o inc gerd and trouble swallowing -- and she feels like food is getting stuck  he also c.o urinary frequency--- no dysuria   Review of Systems  Constitutional: Negative.  Negative for appetite change, diaphoresis, fatigue and unexpected weight change.  HENT: Negative for congestion, ear pain, hearing loss, nosebleeds, postnasal drip, rhinorrhea, sinus pressure, sneezing and tinnitus.   Eyes: Negative for photophobia, pain, discharge, redness, itching and visual disturbance.  Respiratory: Negative.  Negative for cough, chest tightness, shortness of breath and wheezing.   Cardiovascular: Negative.  Negative for chest pain, palpitations and leg swelling.  Gastrointestinal: Negative for abdominal distention, abdominal pain, anal bleeding, blood in stool and constipation.  Endocrine: Negative.  Negative for cold intolerance, heat intolerance, polydipsia, polyphagia and polyuria.  Genitourinary: Positive for frequency. Negative for difficulty urinating and dysuria.  Musculoskeletal: Negative.   Skin: Negative.   Allergic/Immunologic: Negative.   Neurological: Negative for dizziness, weakness, light-headedness, numbness and headaches.  Psychiatric/Behavioral: Negative for agitation, confusion, decreased concentration, dysphoric mood, sleep disturbance and suicidal ideas. The patient is not nervous/anxious.      History Past Medical History:  Diagnosis Date  . Arthritis   . BPH (benign prostatic hypertrophy)   . Colonic polyp   . Hemorrhoid     He has a past surgical history that includes Total knee arthroplasty (2003) and Hemorrhoid surgery.   His family history includes Arthritis in his  sister; Cancer in his mother.He reports that he quit smoking about 36 years ago. He has never used smokeless tobacco. He reports that he does not drink alcohol and does not use drugs.  Current Outpatient Medications on File Prior to Visit  Medication Sig Dispense Refill  . Ascorbic Acid (VITAMIN C) 500 MG tablet Take 1,000 mg by mouth daily.     [name] aspirin 81 MG tablet Take 81 mg by mouth every other day.    [name] azelastine (ASTELIN) 0.1 % nasal spray Place 2 sprays into both nostrils 2 (two) times daily. 30 mL 6  . Cholecalciferol (VITAMIN D) 2000 UNITS CAPS Take 1 capsule by mouth daily.    [name] lactulose (CHRONULAC) 10 GM/15ML solution Take 30 mLs (20 g total) by mouth 2 (two) times daily as needed for moderate constipation. (Patient not taking: Reported on 12/26/2019) 1800 mL 1  . Omega-3 Fatty Acids (FISH OIL) 1200 MG CAPS Take by mouth.    . Saw Palmetto 500 MG CAPS Take 1 capsule by mouth every other day.      No current facility-administered medications on file prior to visit.     Objective:  Objective  Physical Exam Vitals and nursing note reviewed.  Constitutional:      General: He is sleeping.     Appearance: He is well-developed.  HENT:     Head: Normocephalic and atraumatic.  Eyes:     Pupils: Pupils are equal, round, and reactive to light.  Neck:     Thyroid: No thyromegaly.  Cardiovascular:     Rate and Rhythm: Normal rate and regular rhythm.     Heart sounds: No murmur heard.   Pulmonary:     Effort: Pulmonary  effort is normal. No respiratory distress.     Breath sounds: Normal breath sounds. No wheezing or rales.  Chest:     Chest wall: No tenderness.  Musculoskeletal:        General: No tenderness.     Cervical back: Normal range of motion and neck supple.  Skin:    General: Skin is warm and dry.  Neurological:     Mental Status: He is oriented to person, place, and time.  Psychiatric:        Behavior: Behavior normal.        Thought Content: Thought content  normal.        Judgment: Judgment normal.    BP (!) 106/57   Pulse 94   Temp (!) 97 F (36.1 C)   Ht 5\' 9"  (1.753 m)   Wt 191 lb 3.2 oz (86.7 kg)   SpO2 97%   BMI 28.24 kg/m  Wt Readings from Last 3 Encounters:  12/26/19 191 lb 3.2 oz (86.7 kg)  12/26/19 191 lb 3.2 oz (86.7 kg)  09/09/19 191 lb 6.4 oz (86.8 kg)     Lab Results  Component Value Date   WBC 5.9 12/11/2017   HGB 16.2 12/11/2017   HCT 47.1 12/11/2017   PLT 168.0 12/11/2017   GLUCOSE 97 12/11/2017   CHOL 186 12/11/2017   TRIG 107.0 12/11/2017   HDL 43.40 12/11/2017   LDLCALC 121 (H) 12/11/2017   ALT 10 12/11/2017   AST 15 12/11/2017   NA 138 12/11/2017   K 5.0 12/11/2017   CL 100 12/11/2017   CREATININE 0.79 12/11/2017   BUN 13 12/11/2017   CO2 31 12/11/2017   TSH 2.15 10/17/2014   PSA 0.71 09/09/2019   HGBA1C 5.7 12/11/2017    DG Cervical Spine Complete  Result Date: 06/14/2019 CLINICAL DATA:  Pain unable to straighten EXAM: CERVICAL SPINE - COMPLETE 4+ VIEW COMPARISON:  None. FINDINGS: Dens and lateral masses are poorly evaluated. Suboptimal visualization C7 and below. Trace anterolisthesis C4 on C5. Mild diffuse degenerative change C3 through C6. Diffuse posterior facet degenerative change with probable fusion of posterior elements at C3 and C4. IMPRESSION: Inadequate evaluation C7 and below. Dens and lateral masses are poorly evaluated. Trace anterolisthesis C4 on C5 with mild diffuse degenerative changes. Electronically Signed   By: Donavan Foil M.D.   On: 06/14/2019 02:11   DG Thoracic Spine W/Swimmers  Result Date: 06/14/2019 CLINICAL DATA:  Back pain EXAM: THORACIC SPINE - 3 VIEWS COMPARISON:  11/26/2011 FINDINGS: Thoracic alignment is within normal limits. The vertebral body heights are maintained. Mild diffuse degenerative changes with osteophyte. IMPRESSION: Mild diffuse degenerative changes.  No acute osseous abnormality. Electronically Signed   By: Donavan Foil M.D.   On: 06/14/2019 02:06       Assessment & Plan:  Plan  I have discontinued Denzil Magnuson "Stan"'s omeprazole. I am also having him start on omeprazole and doxycycline. Additionally, I am having him maintain his vitamin C, Saw Palmetto, Vitamin D, aspirin, Fish Oil, lactulose, and azelastine.  Meds ordered this encounter  Medications  . omeprazole (PRILOSEC) 40 MG capsule    Sig: Take 1 capsule (40 mg total) by mouth daily.    Dispense:  90 capsule    Refill:  1  . doxycycline (VIBRA-TABS) 100 MG tablet    Sig: Take 1 tablet (100 mg total) by mouth 2 (two) times daily.    Dispense:  20 tablet    Refill:  0  Problem List Items Addressed This Visit      Unprioritized   Gastroesophageal reflux disease - Primary    Worsening--- increase omeprazole 40 mg daily  Consider GI referral if no improvement       Relevant Medications   omeprazole (PRILOSEC) 40 MG capsule   Pressure injury of right buttock, stage 1    Home health referral for wound care       Relevant Medications   doxycycline (VIBRA-TABS) 100 MG tablet   Other Relevant Orders   Ambulatory referral to [place]   Urinary frequency    Urine normal  Culture pending       Relevant Orders   POCT Urinalysis Dipstick (Automated) (Completed)   Urine Culture      Follow-up: Return if symptoms worsen or fail to improve.  Ann Held, DO

## 2019-12-26 NOTE — Assessment & Plan Note (Signed)
Urine normal  Culture pending

## 2019-12-26 NOTE — Patient Instructions (Signed)
Please schedule your next medicare wellness visit with me in 1 yr.  Continue to eat heart healthy diet (full of fruits, vegetables, whole grains, lean protein, water--limit salt, fat, and sugar intake) and increase physical activity as tolerated.  Continue doing brain stimulating activities (puzzles, reading, adult coloring books, staying active) to keep memory sharp.   Bring a copy of your living will and/or healthcare power of attorney to your next office visit.   Kevin Glass , Thank you for taking time to come for your Medicare Wellness Visit. I appreciate your ongoing commitment to your health goals. Please review the following plan we discussed and let me know if I can assist you in the future.   These are the goals we discussed: Goals    . Maintain healthy lifestlye       This is a list of the screening recommended for you and due dates:  Health Maintenance  Topic Date Due  . COVID-19 Vaccine (1) 01/11/2020*  . Tetanus Vaccine  12/25/2020*  . Flu Shot  01/15/2020  . Pneumonia vaccines  Completed  *Topic was postponed. The date shown is not the original due date.    Preventive Care 31 Years and Older, Male Preventive care refers to lifestyle choices and visits with your health care provider that can promote health and wellness. This includes:  A yearly physical exam. This is also called an annual well check.  Regular dental and eye exams.  Immunizations.  Screening for certain conditions.  Healthy lifestyle choices, such as diet and exercise. What can I expect for my preventive care visit? Physical exam Your health care provider will check:  Height and weight. These may be used to calculate body mass index (BMI), which is a measurement that tells if you are at a healthy weight.  Heart rate and blood pressure.  Your skin for abnormal spots. Counseling Your health care provider may ask you questions about:  Alcohol, tobacco, and drug use.  Emotional  well-being.  Home and relationship well-being.  Sexual activity.  Eating habits.  History of falls.  Memory and ability to understand (cognition).  Work and work Statistician. What immunizations do I need?  Influenza (flu) vaccine  This is recommended every year. Tetanus, diphtheria, and pertussis (Tdap) vaccine  You may need a Td booster every 10 years. Varicella (chickenpox) vaccine  You may need this vaccine if you have not already been vaccinated. Zoster (shingles) vaccine  You may need this after age 1. Pneumococcal conjugate (PCV13) vaccine  One dose is recommended after age 54. Pneumococcal polysaccharide (PPSV23) vaccine  One dose is recommended after age 74. Measles, mumps, and rubella (MMR) vaccine  You may need at least one dose of MMR if you were born in 1957 or later. You may also need a second dose. Meningococcal conjugate (MenACWY) vaccine  You may need this if you have certain conditions. Hepatitis A vaccine  You may need this if you have certain conditions or if you travel or work in places where you may be exposed to hepatitis A. Hepatitis B vaccine  You may need this if you have certain conditions or if you travel or work in places where you may be exposed to hepatitis B. Haemophilus influenzae type b (Hib) vaccine  You may need this if you have certain conditions. You may receive vaccines as individual doses or as more than one vaccine together in one shot (combination vaccines). Talk with your health care provider about the risks and benefits of  combination vaccines. What tests do I need? Blood tests  Lipid and cholesterol levels. These may be checked every 5 years, or more frequently depending on your overall health.  Hepatitis C test.  Hepatitis B test. Screening  Lung cancer screening. You may have this screening every year starting at age 55 if you have a 30-pack-year history of smoking and currently smoke or have quit within the  past 15 years.  Colorectal cancer screening. All adults should have this screening starting at age 50 and continuing until age 75. Your health care provider may recommend screening at age 45 if you are at increased risk. You will have tests every 1-10 years, depending on your results and the type of screening test.  Prostate cancer screening. Recommendations will vary depending on your family history and other risks.  Diabetes screening. This is done by checking your blood sugar (glucose) after you have not eaten for a while (fasting). You may have this done every 1-3 years.  Abdominal aortic aneurysm (AAA) screening. You may need this if you are a current or former smoker.  Sexually transmitted disease (STD) testing. Follow these instructions at home: Eating and drinking  Eat a diet that includes fresh fruits and vegetables, whole grains, lean protein, and low-fat dairy products. Limit your intake of foods with high amounts of sugar, saturated fats, and salt.  Take vitamin and mineral supplements as recommended by your health care provider.  Do not drink alcohol if your health care provider tells you not to drink.  If you drink alcohol: ? Limit how much you have to 0-2 drinks a day. ? Be aware of how much alcohol is in your drink. In the [place] one drink equals one 12 oz bottle of beer (355 mL), one 5 oz glass of wine (148 mL), or one 1 oz glass of hard liquor (44 mL). Lifestyle  Take daily care of your teeth and gums.  Stay active. Exercise for at least 30 minutes on 5 or more days each week.  Do not use any products that contain nicotine or tobacco, such as cigarettes, e-cigarettes, and chewing tobacco. If you need help quitting, ask your health care provider.  If you are sexually active, practice safe sex. Use a condom or other form of protection to prevent STIs (sexually transmitted infections).  Talk with your health care provider about taking a low-dose aspirin or  statin. What's next?  Visit your health care provider once a year for a well check visit.  Ask your health care provider how often you should have your eyes and teeth checked.  Stay up to date on all vaccines. This information is not intended to replace advice given to you by your health care provider. Make sure you discuss any questions you have with your health care provider. Document Revised: 05/27/2018 Document Reviewed: 05/27/2018 Elsevier Patient Education  2020 Elsevier Inc.  

## 2019-12-26 NOTE — Assessment & Plan Note (Signed)
Worsening--- increase omeprazole 40 mg daily  Consider GI referral if no improvement

## 2019-12-26 NOTE — Assessment & Plan Note (Signed)
Home health referral for wound care

## 2019-12-27 LAB — URINE CULTURE
MICRO NUMBER:: 10693154
SPECIMEN QUALITY:: ADEQUATE

## 2020-01-03 ENCOUNTER — Encounter: Payer: Self-pay | Admitting: Family Medicine

## 2020-01-03 ENCOUNTER — Other Ambulatory Visit: Payer: Self-pay | Admitting: Family Medicine

## 2020-01-03 ENCOUNTER — Encounter: Payer: Self-pay | Admitting: Physician Assistant

## 2020-01-03 DIAGNOSIS — K219 Gastro-esophageal reflux disease without esophagitis: Secondary | ICD-10-CM

## 2020-01-03 DIAGNOSIS — M65352 Trigger finger, left little finger: Secondary | ICD-10-CM | POA: Diagnosis not present

## 2020-01-03 DIAGNOSIS — M65341 Trigger finger, right ring finger: Secondary | ICD-10-CM | POA: Diagnosis not present

## 2020-01-03 NOTE — Telephone Encounter (Signed)
Refer to GI 

## 2020-01-07 DIAGNOSIS — Z87891 Personal history of nicotine dependence: Secondary | ICD-10-CM | POA: Diagnosis not present

## 2020-01-07 DIAGNOSIS — K219 Gastro-esophageal reflux disease without esophagitis: Secondary | ICD-10-CM | POA: Diagnosis not present

## 2020-01-07 DIAGNOSIS — Z9181 History of falling: Secondary | ICD-10-CM | POA: Diagnosis not present

## 2020-01-07 DIAGNOSIS — M1990 Unspecified osteoarthritis, unspecified site: Secondary | ICD-10-CM | POA: Diagnosis not present

## 2020-01-07 DIAGNOSIS — Z7982 Long term (current) use of aspirin: Secondary | ICD-10-CM | POA: Diagnosis not present

## 2020-01-07 DIAGNOSIS — L89312 Pressure ulcer of right buttock, stage 2: Secondary | ICD-10-CM | POA: Diagnosis not present

## 2020-01-09 DIAGNOSIS — Z87891 Personal history of nicotine dependence: Secondary | ICD-10-CM | POA: Diagnosis not present

## 2020-01-09 DIAGNOSIS — Z7982 Long term (current) use of aspirin: Secondary | ICD-10-CM | POA: Diagnosis not present

## 2020-01-09 DIAGNOSIS — Z9181 History of falling: Secondary | ICD-10-CM | POA: Diagnosis not present

## 2020-01-09 DIAGNOSIS — M1990 Unspecified osteoarthritis, unspecified site: Secondary | ICD-10-CM | POA: Diagnosis not present

## 2020-01-09 DIAGNOSIS — K219 Gastro-esophageal reflux disease without esophagitis: Secondary | ICD-10-CM | POA: Diagnosis not present

## 2020-01-09 DIAGNOSIS — L89312 Pressure ulcer of right buttock, stage 2: Secondary | ICD-10-CM | POA: Diagnosis not present

## 2020-01-13 DIAGNOSIS — K219 Gastro-esophageal reflux disease without esophagitis: Secondary | ICD-10-CM | POA: Diagnosis not present

## 2020-01-13 DIAGNOSIS — Z7982 Long term (current) use of aspirin: Secondary | ICD-10-CM | POA: Diagnosis not present

## 2020-01-13 DIAGNOSIS — Z87891 Personal history of nicotine dependence: Secondary | ICD-10-CM | POA: Diagnosis not present

## 2020-01-13 DIAGNOSIS — Z9181 History of falling: Secondary | ICD-10-CM | POA: Diagnosis not present

## 2020-01-13 DIAGNOSIS — M1990 Unspecified osteoarthritis, unspecified site: Secondary | ICD-10-CM | POA: Diagnosis not present

## 2020-01-13 DIAGNOSIS — L89312 Pressure ulcer of right buttock, stage 2: Secondary | ICD-10-CM | POA: Diagnosis not present

## 2020-01-16 DIAGNOSIS — K219 Gastro-esophageal reflux disease without esophagitis: Secondary | ICD-10-CM | POA: Diagnosis not present

## 2020-01-16 DIAGNOSIS — Z87891 Personal history of nicotine dependence: Secondary | ICD-10-CM | POA: Diagnosis not present

## 2020-01-16 DIAGNOSIS — M1990 Unspecified osteoarthritis, unspecified site: Secondary | ICD-10-CM | POA: Diagnosis not present

## 2020-01-16 DIAGNOSIS — L89312 Pressure ulcer of right buttock, stage 2: Secondary | ICD-10-CM | POA: Diagnosis not present

## 2020-01-16 DIAGNOSIS — Z7982 Long term (current) use of aspirin: Secondary | ICD-10-CM | POA: Diagnosis not present

## 2020-01-16 DIAGNOSIS — Z9181 History of falling: Secondary | ICD-10-CM | POA: Diagnosis not present

## 2020-02-08 DIAGNOSIS — H532 Diplopia: Secondary | ICD-10-CM | POA: Diagnosis not present

## 2020-02-08 DIAGNOSIS — H2513 Age-related nuclear cataract, bilateral: Secondary | ICD-10-CM | POA: Diagnosis not present

## 2020-02-09 ENCOUNTER — Ambulatory Visit: Payer: Medicare Other | Admitting: Physician Assistant

## 2020-02-09 ENCOUNTER — Encounter: Payer: Self-pay | Admitting: Physician Assistant

## 2020-02-09 VITALS — BP 120/64 | HR 69 | Ht 69.0 in | Wt 186.0 lb

## 2020-02-09 DIAGNOSIS — R142 Eructation: Secondary | ICD-10-CM

## 2020-02-09 DIAGNOSIS — K219 Gastro-esophageal reflux disease without esophagitis: Secondary | ICD-10-CM | POA: Diagnosis not present

## 2020-02-09 MED ORDER — OMEPRAZOLE 20 MG PO CPDR: 20.0000 mg | DELAYED_RELEASE_CAPSULE | Freq: Two times a day (BID) | ORAL | 11 refills | Status: DC

## 2020-02-09 NOTE — Patient Instructions (Addendum)
If you are age 84 or older, your body mass index should be between 23-30. Your Body mass index is 27.47 kg/m. If this is out of the aforementioned range listed, please consider follow up with your Primary Care Provider.  If you are age 53 or younger, your body mass index should be between 19-25. Your Body mass index is 27.47 kg/m. If this is out of the aformentioned range listed, please consider follow up with your Primary Care Provider.   START Omeprazole 20 mg 1 capsule twice a day  Use Gas X over the counter as needed.  Follow a low gas diet and anti-reflux regimen.  Follow up as needed.

## 2020-02-09 NOTE — Progress Notes (Signed)
Subjective:    Patient ID: Kevin Glass, male    DOB: 01/11/1923, 86 y.o.   MRN: [id number]  HPI Kevin Glass is a pleasant 86 year old white male, previously known to Dr. Arlyce Dice and last seen in 2016.  He has history of colon polyps, diverticulosis, coronary artery disease and GERD. Patient comes in today after being seen recently by primary care with increased GERD symptoms and increase in burping and belching. Patient had been started on 40 mg of omeprazole every morning in mid July. Notes by PCP relates some complaints of dysphagia, however today patient and his daughter deny any dysphagia or odynophagia. He feels that the omeprazole has helped his symptoms significantly.  He has no complaints of heartburn or indigestion.  His primary symptom had been gas and belching postprandially.  He says he does not have belching on a daily basis and may go to for 2 to 3 days without any symptoms and then have a day with a lot of belching.  No complaints of sour brash, no chest pain, no complaints of abdominal pain.  Appetite has been good, weight is stable. He has occasional constipation for which she uses lactulose though not on a regular basis and occasional MiraLAX and stool softener.  No general issues with abdominal bloating or trapped gas. Colonoscopy 2008 with removal of one small polyp-biopsy showed benign colonic mucosa. No prior EGD. Review of Systems Pertinent positive and negative review of systems were noted in the above HPI section.  All other review of systems was otherwise negative.  Outpatient Encounter Medications as of 02/09/2020  Medication Sig  . Ascorbic Acid (VITAMIN C) 500 MG tablet Take 1,000 mg by mouth daily.   [name] aspirin 81 MG tablet Take 81 mg by mouth every other day.  [name] azelastine (ASTELIN) 0.1 % nasal spray Place 2 sprays into both nostrils 2 (two) times daily.  . Cholecalciferol (VITAMIN D) 2000 UNITS CAPS Take 1 capsule by mouth daily.  [name] doxycycline (VIBRA-TABS) 100 MG  tablet Take 1 tablet (100 mg total) by mouth 2 (two) times daily.  [name] lactulose (CHRONULAC) 10 GM/15ML solution Take 30 mLs (20 g total) by mouth 2 (two) times daily as needed for moderate constipation.  . Omega-3 Fatty Acids (FISH OIL) 1200 MG CAPS Take by mouth.  . Saw Palmetto 500 MG CAPS Take 1 capsule by mouth every other day.   . [DISCONTINUED] omeprazole (PRILOSEC) 40 MG capsule Take 1 capsule (40 mg total) by mouth daily.  [name] omeprazole (PRILOSEC) 20 MG capsule Take 1 capsule (20 mg total) by mouth 2 (two) times daily before a meal.   No facility-administered encounter medications on file as of 02/09/2020.   Allergies  Allergen Reactions  . Penicillins Rash    White blister on the skin    Patient Active Problem List   Diagnosis Date Noted  . Gastroesophageal reflux disease 12/26/2019  . Urinary frequency 12/26/2019  . Pressure injury of right buttock, stage 1 12/26/2019  . Finger pain, right 06/13/2019  . Neck pain 06/13/2019  . Thoracic spine pain 06/13/2019  . Height loss 06/13/2019  . Balance problem 06/13/2019  . Dyspepsia 04/09/2016  . Tendinopathy of rotator cuff 01/02/2015  . Right knee pain 09/21/2014  . Constipation 09/18/2014  . Preventative health care 04/05/2014  . Obesity (BMI 30-39.9) 05/27/2013  . Viral URI 11/21/2010  . DIZZINESS AND GIDDINESS 12/18/2009  . NONSPECIFIC ABNORMAL ELECTROCARDIOGRAM 06/27/2009  . HEMORRHOIDS 08/26/2007  . DIVERTICULOSIS, COLON 08/26/2007  . ARTHRITIS  08/26/2007  . COLONIC POLYPS 05/04/2007  . IMPAIRED GLUCOSE TOLERANCE 02/23/2007  . BPH (benign prostatic hyperplasia) 02/23/2007   Social History   Socioeconomic History  . Marital status: Married    Spouse name: Not on file  . Number of children: Not on file  . Years of education: Not on file  . Highest education level: Not on file  Occupational History  . Occupation: retired  Tobacco Use  . Smoking status: Former Smoker    Quit date: 06/17/1983    Years since  quitting: 36.6  . Smokeless tobacco: Never Used  Vaping Use  . Vaping Use: Never used  Substance and Sexual Activity  . Alcohol use: No    Alcohol/week: 0.0 standard drinks  . Drug use: No  . Sexual activity: Yes    Partners: Female  Other Topics Concern  . Not on file  Social History Narrative   Exercise--[employer] twice a week and walks dog   Social Determinants of Health   Financial Resource Strain: Low Risk   . Difficulty of Paying Living Expenses: Not hard at all  Food Insecurity: No Food Insecurity  . Worried About Programme researcher, broadcasting/film/video in the Last Year: Never true  . Ran Out of Food in the Last Year: Never true  Transportation Needs: No Transportation Needs  . Lack of Transportation (Medical): No  . Lack of Transportation (Non-Medical): No  Physical Activity:   . Days of Exercise per Week: Not on file  . Minutes of Exercise per Session: Not on file  Stress:   . Feeling of Stress : Not on file  Social Connections:   . Frequency of Communication with Friends and Family: Not on file  . Frequency of Social Gatherings with Friends and Family: Not on file  . Attends Religious Services: Not on file  . Active Member of Clubs or Organizations: Not on file  . Attends Banker Meetings: Not on file  . Marital Status: Not on file  Intimate Partner Violence:   . Fear of Current or Ex-Partner: Not on file  . Emotionally Abused: Not on file  . Physically Abused: Not on file  . Sexually Abused: Not on file    Mr. Mastromarino family history includes Arthritis in his sister; Cancer in his mother.      Objective:    Vitals:   02/09/20 0825  BP: 120/64  Pulse: 69    Physical Exam Well-developed well-nourished pleasant elderly white male in no acute distress.  Accompanied by his daughter height, Weight, 186 BMI 27.4 Patient is very kyphotic  HEENT; nontraumatic normocephalic, EOMI, PE RR R LA, sclera anicteric.  Eye patch over right eye Oropharynx; not  examined Neck; supple, no JVD Cardiovascular; regular rate and rhythm with S1-S2, no murmur rub or gallop Pulmonary; Clear bilaterally, severe kyphosis Abdomen; soft, nontender, nondistended, no palpable mass or hepatosplenomegaly, bowel sounds are active Rectal; not done today Skin; benign exam, no jaundice rash or appreciable lesions Extremities; no clubbing cyanosis or edema skin warm and dry Neuro/Psych; alert and oriented x4, grossly nonfocal mood and affect appropriate       Assessment & Plan:   #62 86 year old white male with 1 year history of frequent burping and belching. Significant improvement in symptoms on omeprazole over the past month. , No worrisome symptoms i.e. no dysphagia or odynophagia, no weight loss, no abdominal pain. Symptoms are consistent with GERD. He may have a component of aerophagia, He also has a significant kyphosis which  may be contributing to symptoms.  Plan; Will change omeprazole to 20 mg twice daily AC breakfast and AC dinner. Low gas diet Advised  Gas-X which can be used AC and/or as needed. We discussed both EGD and barium swallow for further evaluation.  Patient and daughter both content with present management, patient does not feel that he needs further work-up at present. Patient will be established with Dr. Meridee Score. Happy to see him in follow-up if he has any changes or progression in his symptoms.  Sary Bogie S Karan Inclan PA-C 02/09/2020   Cc: Donato Schultz, *

## 2020-02-10 NOTE — Progress Notes (Signed)
Attending Physician's Attestation   I have reviewed the chart.   I agree with the Advanced Practitioner's note, impression, and recommendations with any updates as below. Hopefully, patient will get continued benefit while on omeprazole. Although endoscopic evaluation would be reasonable in the setting of these new onset symptoms we understand based on his age and other medical comorbidities the need for try to minimize invasive procedures. If things worsen or do not improve and follow-up as needed, then certainly endoscopic evaluation versus at least a barium swallow should be performed.    Justice Britain, MD [facility] Office # [phone]

## 2020-02-22 DIAGNOSIS — H532 Diplopia: Secondary | ICD-10-CM | POA: Diagnosis not present

## 2020-04-11 ENCOUNTER — Encounter: Payer: Self-pay | Admitting: *Deleted

## 2020-04-16 ENCOUNTER — Ambulatory Visit: Payer: Medicare Other | Admitting: Diagnostic Neuroimaging

## 2020-04-16 ENCOUNTER — Encounter: Payer: Self-pay | Admitting: Diagnostic Neuroimaging

## 2020-04-16 ENCOUNTER — Other Ambulatory Visit: Payer: Self-pay

## 2020-04-16 VITALS — BP 123/74 | HR 96 | Ht 70.0 in | Wt 194.8 lb

## 2020-04-16 DIAGNOSIS — H532 Diplopia: Secondary | ICD-10-CM

## 2020-04-16 DIAGNOSIS — R6889 Other general symptoms and signs: Secondary | ICD-10-CM | POA: Diagnosis not present

## 2020-04-16 NOTE — Patient Instructions (Signed)
  TRANSIENT DOUBLE VISION (fluctuating; both eyes open; with or without prism lenses) - check lab testing; monitor symptoms; may consider MRI brain in future if symptoms worsen

## 2020-04-16 NOTE — Progress Notes (Signed)
[facility]  PATIENT: Kevin Glass DOB: [birth year]  REFERRING CLINICIAN: Melissa Noon, OD HISTORY FROM: patient and daughter  REASON FOR VISIT: new consult    HISTORICAL  CHIEF COMPLAINT:  Chief Complaint  Patient presents with  . Vertical Diplopia    rm 7 New Pt dgr- Gae Bon "double vision treated with prism lenses, Dr Delman Cheadle wanted him checked out"    HISTORY OF PRESENT ILLNESS:   86 year old male with double vision.  Symptoms started in June 2021 with sudden onset double vision.  Patient recalls horizontal double vision but patient's daughter and referring notes mention diagonal or vertical double vision.  Symptoms were present with both eyes open.  Symptoms have been fluctuating.  Patient went to eye doctor in August 2021 for evaluation and was fitted with prism glasses.  Since that time symptoms are overall improved but continue to fluctuate.  Today patient does not have a double vision with his glasses on or off.  No headaches, numbness, weakness, slurred speech.   REVIEW OF SYSTEMS: Full 14 system review of systems performed and negative with exception of: As per HPI.  ALLERGIES: Allergies  Allergen Reactions  . Penicillins Rash    White blister on the skin     HOME MEDICATIONS: Outpatient Medications Prior to Visit  Medication Sig Dispense Refill  . Ascorbic Acid (VITAMIN C) 500 MG tablet Take 1,000 mg by mouth daily.     [name] aspirin 81 MG tablet Take 81 mg by mouth every other day.    [name] azelastine (ASTELIN) 0.1 % nasal spray Place 2 sprays into both nostrils 2 (two) times daily. 30 mL 6  . Cholecalciferol (VITAMIN D) 2000 UNITS CAPS Take 1 capsule by mouth daily.    [name] lactulose (CHRONULAC) 10 GM/15ML solution Take 30 mLs (20 g total) by mouth 2 (two) times daily as needed for moderate constipation. 1800 mL 1  . Omega-3 Fatty Acids (FISH OIL) 1200 MG CAPS Take by mouth.    [name] omeprazole (PRILOSEC) 20 MG capsule Take 1 capsule (20 mg total) by  mouth 2 (two) times daily before a meal. 60 capsule 11  . Saw Palmetto 500 MG CAPS Take 1 capsule by mouth every other day.     [name] doxycycline (VIBRA-TABS) 100 MG tablet Take 1 tablet (100 mg total) by mouth 2 (two) times daily. 20 tablet 0   No facility-administered medications prior to visit.    PAST MEDICAL HISTORY: Past Medical History:  Diagnosis Date  . Acid reflux   . Arthritis   . BPH (benign prostatic hypertrophy)   . Colonic polyp   . Hemorrhoid   . Vertical diplopia     PAST SURGICAL HISTORY: Past Surgical History:  Procedure Laterality Date  . HEMORRHOID SURGERY    . HERNIA REPAIR  1994   x 2  . TOTAL KNEE ARTHROPLASTY  2003   Dr Maureen Ralphs    FAMILY HISTORY: Family History  Problem Relation Age of Onset  . Cancer Mother   . Parkinson's disease Father   . Arthritis Sister     SOCIAL HISTORY: Social History   Socioeconomic History  . Marital status: Widowed    Spouse name: Not on file  . Number of children: 3  . Years of education: 51  . Highest education level: Not on file  Occupational History  . Occupation: retired  Tobacco Use  . Smoking status: Former Smoker    Quit date: 06/16/1984    Years since quitting: 35.8  .  Smokeless tobacco: Never Used  Vaping Use  . Vaping Use: Never used  Substance and Sexual Activity  . Alcohol use: No    Alcohol/week: 0.0 standard drinks    Comment: quit 1986  . Drug use: No  . Sexual activity: Yes    Partners: Female  Other Topics Concern  . Not on file  Social History Narrative   [birth year]/1/21ives with daughter Gae Bon   No caffeine   Exercise--[employer] twice a week and walks dog   Social Determinants of Health   Financial Resource Strain: Low Risk   . Difficulty of Paying Living Expenses: Not hard at all  Food Insecurity: No Food Insecurity  . Worried About Charity fundraiser in the Last Year: Never true  . Ran Out of Food in the Last Year: Never true  Transportation Needs: No Transportation Needs  . Lack  of Transportation (Medical): No  . Lack of Transportation (Non-Medical): No  Physical Activity:   . Days of Exercise per Week: Not on file  . Minutes of Exercise per Session: Not on file  Stress:   . Feeling of Stress : Not on file  Social Connections:   . Frequency of Communication with Friends and Family: Not on file  . Frequency of Social Gatherings with Friends and Family: Not on file  . Attends Religious Services: Not on file  . Active Member of Clubs or Organizations: Not on file  . Attends Archivist Meetings: Not on file  . Marital Status: Not on file  Intimate Partner Violence:   . Fear of Current or Ex-Partner: Not on file  . Emotionally Abused: Not on file  . Physically Abused: Not on file  . Sexually Abused: Not on file     PHYSICAL EXAM  GENERAL EXAM/CONSTITUTIONAL: Vitals:  Vitals:   04/16/20 0846  BP: 123/74  Pulse: 96  Weight: 194 lb 12.8 oz (88.4 kg)  Height: 5\' 10"  (1.778 m)     Body mass index is 27.95 kg/m. Wt Readings from Last 3 Encounters:  04/16/20 194 lb 12.8 oz (88.4 kg)  02/09/20 186 lb (84.4 kg)  12/26/19 191 lb 3.2 oz (86.7 kg)     Patient is in no distress; well developed, nourished and groomed; neck is supple  CARDIOVASCULAR:  Examination of carotid arteries is normal; no carotid bruits  Regular rate and rhythm, no murmurs  Examination of peripheral vascular system by observation and palpation is normal  EYES:  Ophthalmoscopic exam of optic discs and posterior segments is normal; no papilledema or hemorrhages  No exam data present  MUSCULOSKELETAL:  Gait, strength, tone, movements noted in Neurologic exam below  NEUROLOGIC: MENTAL STATUS:  No flowsheet data found.  awake, alert, oriented to person, place and time  recent and remote memory intact  normal attention and concentration  language fluent, comprehension intact, naming intact  fund of knowledge appropriate  CRANIAL NERVE:   2nd - no  papilledema on fundoscopic exam  2nd, 3rd, 4th, 6th - pupils equal and reactive to light, visual fields full to confrontation, extraocular muscles intact, no nystagmus; NO DOUBLE VISION (WITH OR WITHOUTGLASSES)  5th - facial sensation symmetric  7th - facial strength symmetric  8th - hearing --> DECR  9th - palate elevates symmetrically, uvula midline  11th - shoulder shrug symmetric  12th - tongue protrusion midline  MOTOR:   normal bulk and tone, full strength in the BUE, BLE  SENSORY:   normal and symmetric to light touch, temperature,  vibration  COORDINATION:   finger-nose-finger, fine finger movements normal  REFLEXES:   deep tendon reflexes TRACE and symmetric  GAIT/STATION:   STOOPED POSTURE; SEVERE KYPHOSIS; SHORT STEPS; UNSTEADY; SINGLE POINT CANE     DIAGNOSTIC DATA (LABS, IMAGING, TESTING) - I reviewed patient records, labs, notes, testing and imaging myself where available.  Lab Results  Component Value Date   WBC 5.9 12/11/2017   HGB 16.2 12/11/2017   HCT 47.1 12/11/2017   MCV 105.3 (H) 12/11/2017   PLT 168.0 12/11/2017      Component Value Date/Time   NA 138 12/11/2017 0925   K 5.0 12/11/2017 0925   CL 100 12/11/2017 0925   CO2 31 12/11/2017 0925   GLUCOSE 97 12/11/2017 0925   BUN 13 12/11/2017 0925   CREATININE 0.79 12/11/2017 0925   CREATININE 0.89 05/27/2013 1608   CALCIUM 9.5 12/11/2017 0925   PROT 7.0 12/11/2017 0925   ALBUMIN 4.4 12/11/2017 0925   AST 15 12/11/2017 0925   ALT 10 12/11/2017 0925   ALKPHOS 54 12/11/2017 0925   BILITOT 1.0 12/11/2017 0925   GFRNONAA 79 (L) 09/20/2011 1820   GFRAA >90 09/20/2011 1820   Lab Results  Component Value Date   CHOL 186 12/11/2017   HDL 43.40 12/11/2017   LDLCALC 121 (H) 12/11/2017   TRIG 107.0 12/11/2017   CHOLHDL 4 12/11/2017   Lab Results  Component Value Date   HGBA1C 5.7 12/11/2017   No results found for: VITAMINB12 Lab Results  Component Value Date   TSH 2.15  10/17/2014       ASSESSMENT AND PLAN  86 y.o. year old male here with transient intermittent double vision since June 2021.  Dx:  1. Double vision with both eyes open      PLAN:  TRANSIENT DOUBLE VISION (fluctuating; both eyes open; with or without prism lenses) - check lab testing; monitor symptoms; may consider MRI brain in future if symptoms worsen  Orders Placed This Encounter  Procedures  . CBC with Differential/Platelet  . Comprehensive metabolic panel  . Vitamin B12  . TSH  . AChR Abs with Reflex to MuSK  . CK  . Aldolase   Return for pending if symptoms worsen or fail to improve, return to PCP.    Penni Bombard, MD 76/01/880, 1:03 AM Certified in Neurology, Neurophysiology and Neuroimaging  [facility] [address] [place], [place] [address] [phone]

## 2020-04-17 ENCOUNTER — Telehealth: Payer: Self-pay | Admitting: *Deleted

## 2020-04-17 DIAGNOSIS — H532 Diplopia: Secondary | ICD-10-CM

## 2020-04-17 NOTE — Telephone Encounter (Signed)
LVM informing patient most of his labs are unremarkable. His B12 is very low. Dr Leta Baptist recommends he start oral replacement B12 1065mcg daily and follow up with PCP for possible injections. Repeated this message, left # for questions.

## 2020-04-19 LAB — CBC WITH DIFFERENTIAL/PLATELET
Basophils Absolute: 0 10*3/uL (ref 0.0–0.2)
Basos: 1 %
EOS (ABSOLUTE): 0.2 10*3/uL (ref 0.0–0.4)
Eos: 3 %
Hematocrit: 43.1 % (ref 37.5–51.0)
Hemoglobin: 15.1 g/dL (ref 13.0–17.7)
Immature Grans (Abs): 0 10*3/uL (ref 0.0–0.1)
Immature Granulocytes: 0 %
Lymphocytes Absolute: 1.9 10*3/uL (ref 0.7–3.1)
Lymphs: 34 %
MCH: 37.9 pg — ABNORMAL HIGH (ref 26.6–33.0)
MCHC: 35 g/dL (ref 31.5–35.7)
MCV: 108 fL — ABNORMAL HIGH (ref 79–97)
Monocytes Absolute: 0.4 10*3/uL (ref 0.1–0.9)
Monocytes: 7 %
Neutrophils Absolute: 3 10*3/uL (ref 1.4–7.0)
Neutrophils: 55 %
Platelets: 160 10*3/uL (ref 150–450)
RBC: 3.98 x10E6/uL — ABNORMAL LOW (ref 4.14–5.80)
RDW: 13.5 % (ref 11.6–15.4)
WBC: 5.5 10*3/uL (ref 3.4–10.8)

## 2020-04-19 LAB — COMPREHENSIVE METABOLIC PANEL
ALT: 8 IU/L (ref 0–44)
AST: 14 IU/L (ref 0–40)
Albumin/Globulin Ratio: 1.7 (ref 1.2–2.2)
Albumin: 4.1 g/dL (ref 3.5–4.6)
Alkaline Phosphatase: 66 IU/L (ref 44–121)
BUN/Creatinine Ratio: 19 (ref 10–24)
BUN: 15 mg/dL (ref 10–36)
Bilirubin Total: 0.6 mg/dL (ref 0.0–1.2)
CO2: 27 mmol/L (ref 20–29)
Calcium: 9.3 mg/dL (ref 8.6–10.2)
Chloride: 101 mmol/L (ref 96–106)
Creatinine, Ser: 0.79 mg/dL (ref 0.76–1.27)
GFR calc Af Amer: 88 mL/min/{1.73_m2} (ref >59)
GFR calc non Af Amer: 76 mL/min/{1.73_m2} (ref >59)
Globulin, Total: 2.4 g/dL (ref 1.5–4.5)
Glucose: 88 mg/dL (ref 65–99)
Potassium: 4.6 mmol/L (ref 3.5–5.2)
Sodium: 140 mmol/L (ref 134–144)
Total Protein: 6.5 g/dL (ref 6.0–8.5)

## 2020-04-19 LAB — VITAMIN B12: Vitamin B-12: 104 pg/mL — ABNORMAL LOW (ref 232–1245)

## 2020-04-19 LAB — TSH: TSH: 4.24 u[IU]/mL (ref 0.450–4.500)

## 2020-04-19 LAB — ACHR ABS WITH REFLEX TO MUSK: AChR Binding Ab, Serum: 33.1 nmol/L — ABNORMAL HIGH (ref 0.00–0.24)

## 2020-04-19 LAB — ALDOLASE: Aldolase: 4.2 U/L (ref 3.3–10.3)

## 2020-04-19 LAB — CK: Total CK: 34 U/L (ref 30–208)

## 2020-04-19 LAB — REFLEX TEST INFORMATION

## 2020-04-24 NOTE — Telephone Encounter (Signed)
Pt's daughter Gae Bon called back and states after the family had a sitdown to discuss, the pt has agreed to go ahead and start medication for his diagnosis Myasthenia Gravis. Pt uses the [facility] [employer] Please advise.

## 2020-04-24 NOTE — Telephone Encounter (Signed)
Called patient, reached daughter Gae Bon on [place] and advised her of previous VM I had left. She was aware and aware of low B12. I informed her that the remaining lab showed him to have myasthenia gravis. He may consider mestinon if double vision issues persist. She asked what other symptoms might they see; I advised her of typical MG symptoms. She stated she would let us know if  he develops them or if vision worsens. She  verbalized understanding, appreciation.

## 2020-04-25 NOTE — Telephone Encounter (Signed)
Dr Leta Baptist will call family to discuss.

## 2020-04-25 NOTE — Telephone Encounter (Signed)
Pt's daughter, Duanne Duchesne (on [place]) called, checking on the status of the medication we have agreed to go ahead and start the medication. Would like a call from the nurse. Contact info: [phone].

## 2020-04-26 NOTE — Telephone Encounter (Signed)
Pt.'s daughter Gae Bon called back to give cell phone number if unable to reach her on house phone. [phone]

## 2020-05-01 MED ORDER — PYRIDOSTIGMINE BROMIDE 60 MG PO TABS
30.0000 mg | ORAL_TABLET | Freq: Three times a day (TID) | ORAL | 6 refills | Status: DC
Start: 2020-05-01 — End: 2021-03-06

## 2020-05-01 NOTE — Telephone Encounter (Signed)
I called daughter. Explained dx of myasthenia gravis.  Patient having some ongoing intermittent double vision.  Has had some mild generalized weakness and breathing issues over the past few years but not currently.  Trial of mestinon 30mg  twice a day; may increase to 1 tab three times a day.   Meds ordered this encounter  Medications  . pyridostigmine (MESTINON) 60 MG tablet    Sig: Take 0.5-1 tablets (30-60 mg total) by mouth 3 (three) times daily.    Dispense:  90 tablet    Refill:  [place], MD 68/15/9470, 7:61 PM Certified in Neurology, Neurophysiology and Neuroimaging  [facility] [address] [place], [place] [address] [phone]

## 2020-05-01 NOTE — Addendum Note (Signed)
Addended by: Andrey Spearman R on: 05/01/2020 04:40 PM   Modules accepted: Orders

## 2020-06-01 DIAGNOSIS — M65341 Trigger finger, right ring finger: Secondary | ICD-10-CM | POA: Diagnosis not present

## 2020-06-01 DIAGNOSIS — M65352 Trigger finger, left little finger: Secondary | ICD-10-CM | POA: Diagnosis not present

## 2020-06-07 ENCOUNTER — Emergency Department (HOSPITAL_BASED_OUTPATIENT_CLINIC_OR_DEPARTMENT_OTHER): Payer: Medicare Other

## 2020-06-07 ENCOUNTER — Encounter (HOSPITAL_BASED_OUTPATIENT_CLINIC_OR_DEPARTMENT_OTHER): Payer: Self-pay | Admitting: Emergency Medicine

## 2020-06-07 ENCOUNTER — Other Ambulatory Visit: Payer: Self-pay

## 2020-06-07 ENCOUNTER — Telehealth: Payer: Self-pay | Admitting: Family Medicine

## 2020-06-07 ENCOUNTER — Emergency Department (HOSPITAL_BASED_OUTPATIENT_CLINIC_OR_DEPARTMENT_OTHER)
Admission: EM | Admit: 2020-06-07 | Discharge: 2020-06-07 | Discharge status: Against Medical Advice | Payer: Medicare Other | Attending: Emergency Medicine | Admitting: Emergency Medicine

## 2020-06-07 DIAGNOSIS — U071 COVID-19: Secondary | ICD-10-CM | POA: Diagnosis not present

## 2020-06-07 DIAGNOSIS — R609 Edema, unspecified: Secondary | ICD-10-CM | POA: Diagnosis not present

## 2020-06-07 DIAGNOSIS — Z7982 Long term (current) use of aspirin: Secondary | ICD-10-CM | POA: Diagnosis not present

## 2020-06-07 DIAGNOSIS — Z87891 Personal history of nicotine dependence: Secondary | ICD-10-CM | POA: Insufficient documentation

## 2020-06-07 DIAGNOSIS — R059 Cough, unspecified: Secondary | ICD-10-CM | POA: Diagnosis not present

## 2020-06-07 DIAGNOSIS — R0602 Shortness of breath: Secondary | ICD-10-CM

## 2020-06-07 DIAGNOSIS — I472 Ventricular tachycardia, unspecified: Secondary | ICD-10-CM

## 2020-06-07 DIAGNOSIS — R0902 Hypoxemia: Secondary | ICD-10-CM

## 2020-06-07 LAB — CBC WITH DIFFERENTIAL/PLATELET
Abs Immature Granulocytes: 0.01 10*3/uL (ref 0.00–0.07)
Basophils Absolute: 0 10*3/uL (ref 0.0–0.1)
Basophils Relative: 0 %
Eosinophils Absolute: 0 10*3/uL (ref 0.0–0.5)
Eosinophils Relative: 0 %
HCT: 45.1 % (ref 39.0–52.0)
Hemoglobin: 15.6 g/dL (ref 13.0–17.0)
Immature Granulocytes: 0 %
Lymphocytes Relative: 23 %
Lymphs Abs: 1.2 10*3/uL (ref 0.7–4.0)
MCH: 36.4 pg — ABNORMAL HIGH (ref 26.0–34.0)
MCHC: 34.6 g/dL (ref 30.0–36.0)
MCV: 105.1 fL — ABNORMAL HIGH (ref 80.0–100.0)
Monocytes Absolute: 0.4 10*3/uL (ref 0.1–1.0)
Monocytes Relative: 9 %
Neutro Abs: 3.5 10*3/uL (ref 1.7–7.7)
Neutrophils Relative %: 68 %
Platelets: 169 10*3/uL (ref 150–400)
RBC: 4.29 MIL/uL (ref 4.22–5.81)
RDW: 12.2 % (ref 11.5–15.5)
WBC: 5.1 10*3/uL (ref 4.0–10.5)
nRBC: 0 % (ref 0.0–0.2)

## 2020-06-07 LAB — COMPREHENSIVE METABOLIC PANEL
ALT: 14 U/L (ref 0–44)
AST: 18 U/L (ref 15–41)
Albumin: 3.7 g/dL (ref 3.5–5.0)
Alkaline Phosphatase: 54 U/L (ref 38–126)
Anion gap: 8 (ref 5–15)
BUN: 16 mg/dL (ref 8–23)
CO2: 29 mmol/L (ref 22–32)
Calcium: 8.6 mg/dL — ABNORMAL LOW (ref 8.9–10.3)
Chloride: 99 mmol/L (ref 98–111)
Creatinine, Ser: 0.78 mg/dL (ref 0.61–1.24)
GFR, Estimated: >60 mL/min (ref >60)
Glucose, Bld: 94 mg/dL (ref 70–99)
Potassium: 4.3 mmol/L (ref 3.5–5.1)
Sodium: 136 mmol/L (ref 135–145)
Total Bilirubin: 1.1 mg/dL (ref 0.3–1.2)
Total Protein: 6.8 g/dL (ref 6.5–8.1)

## 2020-06-07 LAB — RESP PANEL BY RT-PCR (FLU A&B, COVID) ARPGX2
Influenza A by PCR: NEGATIVE
Influenza B by PCR: NEGATIVE
SARS Coronavirus 2 by RT PCR: POSITIVE — AB

## 2020-06-07 LAB — URINALYSIS, ROUTINE W REFLEX MICROSCOPIC
Bilirubin Urine: NEGATIVE
Glucose, UA: NEGATIVE mg/dL
Hgb urine dipstick: NEGATIVE
Ketones, ur: 40 mg/dL — AB
Leukocytes,Ua: NEGATIVE
Nitrite: NEGATIVE
Protein, ur: NEGATIVE mg/dL
Specific Gravity, Urine: 1.015 (ref 1.005–1.030)
pH: 7.5 (ref 5.0–8.0)

## 2020-06-07 LAB — BRAIN NATRIURETIC PEPTIDE: B Natriuretic Peptide: 232.6 pg/mL — ABNORMAL HIGH (ref 0.0–100.0)

## 2020-06-07 LAB — LACTIC ACID, PLASMA
Lactic Acid, Venous: 1.2 mmol/L (ref 0.5–1.9)
Lactic Acid, Venous: 1.5 mmol/L (ref 0.5–1.9)

## 2020-06-07 MED ORDER — SODIUM CHLORIDE 0.9 % IV SOLN: INTRAVENOUS | Status: DC | PRN

## 2020-06-07 MED ORDER — DIPHENHYDRAMINE HCL 50 MG/ML IJ SOLN: 50.0000 mg | Freq: Once | INTRAMUSCULAR | Status: DC | PRN

## 2020-06-07 MED ORDER — ALBUTEROL SULFATE HFA 108 (90 BASE) MCG/ACT IN AERS: 2.0000 | INHALATION_SPRAY | Freq: Once | RESPIRATORY_TRACT | Status: DC | PRN

## 2020-06-07 MED ORDER — FAMOTIDINE IN NACL 20-0.9 MG/50ML-% IV SOLN: 20.0000 mg | Freq: Once | INTRAVENOUS | Status: DC | PRN

## 2020-06-07 MED ORDER — SODIUM CHLORIDE 0.9 % IV SOLN
1200.0000 mg | Freq: Once | INTRAVENOUS | Status: AC
  Administered 2020-06-07: 13:00:00 1200 mg via INTRAVENOUS
  Filled 2020-06-07: qty 10

## 2020-06-07 MED ORDER — METHYLPREDNISOLONE SODIUM SUCC 125 MG IJ SOLR: 125.0000 mg | Freq: Once | INTRAMUSCULAR | Status: DC | PRN

## 2020-06-07 MED ORDER — EPINEPHRINE 0.3 MG/0.3ML IJ SOAJ: 0.3000 mg | Freq: Once | INTRAMUSCULAR | Status: DC | PRN

## 2020-06-07 NOTE — Discharge Instructions (Addendum)
Your work-up today was concerning for multiple findings.  You were found to have COVID-19 infection likely contributing to the episodes of cough and shortness of breath.  Your oxygen saturations were in the 90s normally but they did drop into the 80s at times.  We also found you to have some irregularity with your heartbeat with episodes of V. tach and intermittent arrhythmias.  With your recent diagnosis of myasthenia gravis, the intermittent hypoxia, and the new COVID-19 infection, we were concerned that you need admission for further management.  After a long shared decision-making conversation with you and your family, you would like to go home.  To respect your wishes, we did feels appropriate to provide Monocal antibody infusion which he received today.  You were then observed and had no reactions.  As we are recommending admission and you do not want to be admitted, and we are still concerned with how sick you could become, you are leaving against our medical advice.  Please call to follow-up with your primary doctor.  Please rest and stay hydrated.  If you change your mind and have any worsening symptoms, please return to the nearest emergency department for likely admission.

## 2020-06-07 NOTE — ED Notes (Signed)
Tech in with patient placing condom catheter

## 2020-06-07 NOTE — ED Notes (Signed)
MD aware of abnormal VS

## 2020-06-07 NOTE — ED Notes (Signed)
XR at bedside

## 2020-06-07 NOTE — ED Notes (Signed)
Pt is a DNR. Yellow DNR brought from Dr office upstairs

## 2020-06-07 NOTE — Telephone Encounter (Signed)
Per Tia in the emergency room they are requesting a copy of patient DNR. Per Tia they would like someone to bring it to the Emergency room.   Please Advise

## 2020-06-07 NOTE — ED Notes (Signed)
RT unable to ambulate patient due to his condition. He was unable to walk, not because of SOB but because of being unable to walk well at baseline. I was going to obtain a NIF due to not having one. MD aware

## 2020-06-07 NOTE — ED Provider Notes (Signed)
[facility] EMERGENCY DEPARTMENT Provider Note   CSN: [id number] Arrival date & time: 06/07/20  [id number]     History Chief Complaint  Patient presents with  . Cough    Kevin Glass is a 86 y.o. male.  The history is provided by the patient and medical records. No language interpreter was used.  Cough Cough characteristics:  Productive Sputum characteristics:  Nondescript Severity:  Moderate Onset quality:  Gradual Duration:  3 days Timing:  Constant Progression:  Waxing and waning Chronicity:  New Context: sick contacts (granddaughter)   Worsened by:  Nothing Ineffective treatments:  None tried Associated symptoms: shortness of breath (occasional reported to nursing)   Associated symptoms: no chest pain, no chills, no diaphoresis, no fever, no headaches, no rash and no wheezing        Past Medical History:  Diagnosis Date  . Acid reflux   . Arthritis   . BPH (benign prostatic hypertrophy)   . Colonic polyp   . Hemorrhoid   . Vertical diplopia     Patient Active Problem List   Diagnosis Date Noted  . Gastroesophageal reflux disease 12/26/2019  . Urinary frequency 12/26/2019  . Pressure injury of right buttock, stage 1 12/26/2019  . Finger pain, right 06/13/2019  . Neck pain 06/13/2019  . Thoracic spine pain 06/13/2019  . Height loss 06/13/2019  . Balance problem 06/13/2019  . Dyspepsia 04/09/2016  . Tendinopathy of rotator cuff 01/02/2015  . Right knee pain 09/21/2014  . Constipation 09/18/2014  . Preventative health care 04/05/2014  . Obesity (BMI 30-39.9) 05/27/2013  . Viral URI 11/21/2010  . DIZZINESS AND GIDDINESS 12/18/2009  . NONSPECIFIC ABNORMAL ELECTROCARDIOGRAM 06/27/2009  . HEMORRHOIDS 08/26/2007  . DIVERTICULOSIS, COLON 08/26/2007  . ARTHRITIS 08/26/2007  . COLONIC POLYPS 05/04/2007  . IMPAIRED GLUCOSE TOLERANCE 02/23/2007  . BPH (benign prostatic hyperplasia) 02/23/2007    Past Surgical History:  Procedure Laterality Date   . HEMORRHOID SURGERY    . HERNIA REPAIR  1994   x 2  . TOTAL KNEE ARTHROPLASTY  2003   Dr Maureen Ralphs       Family History  Problem Relation Age of Onset  . Cancer Mother   . Parkinson's disease Father   . Arthritis Sister     Social History   Tobacco Use  . Smoking status: Former Smoker    Quit date: 06/16/1984    Years since quitting: 36.0  . Smokeless tobacco: Never Used  Vaping Use  . Vaping Use: Never used  Substance Use Topics  . Alcohol use: No    Alcohol/week: 0.0 standard drinks    Comment: quit 1986  . Drug use: No    Home Medications Prior to Admission medications   Medication Sig Start Date End Date Taking? Authorizing Provider  Ascorbic Acid (VITAMIN C) 500 MG tablet Take 1,000 mg by mouth daily.     [provider]  aspirin 81 MG tablet Take 81 mg by mouth every other day.    [provider]  azelastine (ASTELIN) 0.1 % nasal spray Place 2 sprays into both nostrils 2 (two) times daily. 07/09/18   Colon Branch, MD  Cholecalciferol (VITAMIN D) 2000 UNITS CAPS Take 1 capsule by mouth daily.    [provider]  lactulose (CHRONULAC) 10 GM/15ML solution Take 30 mLs (20 g total) by mouth 2 (two) times daily as needed for moderate constipation. 12/11/17   Ann Held, DO  Omega-3 Fatty Acids (FISH OIL) 1200 MG CAPS  Take by mouth.    [provider]  omeprazole (PRILOSEC) 20 MG capsule Take 1 capsule (20 mg total) by mouth 2 (two) times daily before a meal. 02/09/20   Esterwood, Amy S, PA-C  pyridostigmine (MESTINON) 60 MG tablet Take 0.5-1 tablets (30-60 mg total) by mouth 3 (three) times daily. 05/01/20   Penumalli, Earlean Polka, MD  Saw Palmetto 500 MG CAPS Take 1 capsule by mouth every other day.     [provider]    Allergies    Penicillins  Review of Systems   Review of Systems  Constitutional: Positive for fatigue. Negative for chills, diaphoresis and fever.  Eyes: Negative for visual disturbance.   Respiratory: Positive for cough and shortness of breath (occasional reported to nursing). Negative for chest tightness and wheezing.   Cardiovascular: Positive for leg swelling (at baseline). Negative for chest pain and palpitations.  Gastrointestinal: Negative for abdominal pain, constipation, diarrhea, nausea and vomiting.  Genitourinary: Negative for dysuria, flank pain and frequency.  Musculoskeletal: Negative for back pain, neck pain and neck stiffness.  Skin: Negative for rash and wound.  Neurological: Negative for light-headedness and headaches.  Psychiatric/Behavioral: Negative for agitation and confusion.  All other systems reviewed and are negative.   Physical Exam Updated Vital Signs BP 140/87 (BP Location: Right Arm)   Pulse (!) 108   Temp 98.3 F (36.8 C) (Oral)   Resp (!) 24   Ht 5\' 9"  (1.753 m)   Wt 86.3 kg   SpO2 98%   BMI 28.09 kg/m   Physical Exam Vitals and nursing note reviewed.  Constitutional:      General: He is not in acute distress.    Appearance: He is well-developed and well-nourished. He is not ill-appearing, toxic-appearing or diaphoretic.  HENT:     Head: Normocephalic and atraumatic.     Nose: Nose normal.     Mouth/Throat:     Mouth: Mucous membranes are moist.     Pharynx: No oropharyngeal exudate or posterior oropharyngeal erythema.  Eyes:     Extraocular Movements: Extraocular movements intact.     Conjunctiva/sclera: Conjunctivae normal.     Pupils: Pupils are equal, round, and reactive to light.  Cardiovascular:     Rate and Rhythm: Regular rhythm. Tachycardia present.     Pulses: Normal pulses.     Heart sounds: No murmur heard.   Pulmonary:     Effort: Pulmonary effort is normal. No respiratory distress.     Breath sounds: Rhonchi and rales present. No wheezing.  Chest:     Chest wall: No tenderness.  Abdominal:     General: Abdomen is flat.     Palpations: Abdomen is soft.     Tenderness: There is no abdominal tenderness.  There is no right CVA tenderness, left CVA tenderness, guarding or rebound.  Musculoskeletal:        General: No tenderness.     Cervical back: Neck supple. No tenderness.     Right lower leg: Edema present.     Left lower leg: Edema present.  Skin:    General: Skin is warm and dry.     Capillary Refill: Capillary refill takes less than 2 seconds.     Findings: No erythema or rash.  Neurological:     General: No focal deficit present.     Mental Status: He is alert.  Psychiatric:        Mood and Affect: Mood and affect and mood normal.     ED  Results / Procedures / Treatments   Labs (all labs ordered are listed, but only abnormal results are displayed) Labs Reviewed  RESP PANEL BY RT-PCR (FLU A&B, COVID) ARPGX2 - Abnormal; Notable for the following components:      Result Value   SARS Coronavirus 2 by RT PCR POSITIVE (*)    All other components within normal limits  CBC WITH DIFFERENTIAL/PLATELET - Abnormal; Notable for the following components:   MCV 105.1 (*)    MCH 36.4 (*)    All other components within normal limits  COMPREHENSIVE METABOLIC PANEL - Abnormal; Notable for the following components:   Calcium 8.6 (*)    All other components within normal limits  BRAIN NATRIURETIC PEPTIDE - Abnormal; Notable for the following components:   B Natriuretic Peptide 232.6 (*)    All other components within normal limits  URINALYSIS, ROUTINE W REFLEX MICROSCOPIC - Abnormal; Notable for the following components:   Ketones, ur 40 (*)    All other components within normal limits  CULTURE, BLOOD (ROUTINE X 2)  CULTURE, BLOOD (ROUTINE X 2)  LACTIC ACID, PLASMA  LACTIC ACID, PLASMA    EKG EKG Interpretation  Date/Time:  Thursday June 07 2020 09:08:45 EST Ventricular Rate:  113 PR Interval:    QRS Duration: 108 QT Interval:  352 QTC Calculation: 355 R Axis:   -45 Text Interpretation: Sinus tachycardia Ventricular tachycardia, unsustained Prolonged PR interval Probable  left atrial enlargement LAD, consider left anterior fascicular block RSR' in V1 or V2, right VCD or RVH When compared to prior, more PVC with bigeminy. No STEMI Confirmed by Antony Blackbird [phone]) on 06/07/2020 9:18:05 AM   Radiology DG Chest Portable 1 View  Result Date: 06/07/2020 CLINICAL DATA:  Productive cough, mild shortness of breath EXAM: PORTABLE CHEST 1 VIEW COMPARISON:  07/09/2018 FINDINGS: Chronic interstitial changes bilaterally. Suspected superimposed patchy increased density. No significant pleural effusion. No pneumothorax. Chronic elevation of the right hemidiaphragm. Stable cardiomediastinal contours. IMPRESSION: Chronic interstitial changes with suspected superimposed patchy density bilaterally, which may reflect edema or pneumonia. Electronically Signed   By: Macy Mis M.D.   On: 06/07/2020 08:32    Procedures Procedures (including critical care time)  Medications Ordered in ED Medications  0.9 %  sodium chloride infusion (has no administration in time range)  diphenhydrAMINE (BENADRYL) injection 50 mg (has no administration in time range)  famotidine (PEPCID) IVPB 20 mg premix (has no administration in time range)  methylPREDNISolone sodium succinate (SOLU-MEDROL) 125 mg/2 mL injection 125 mg (has no administration in time range)  albuterol (VENTOLIN HFA) 108 (90 Base) MCG/ACT inhaler 2 puff (has no administration in time range)  EPINEPHrine (EPI-PEN) injection 0.3 mg (has no administration in time range)  casirivimab-imdevimab (REGEN-COV) 1,200 mg in sodium chloride 0.9 % 110 mL IVPB (0 mg Intravenous Stopped 06/07/20 1334)    ED Course  I have reviewed the triage vital signs and the nursing notes.  Pertinent labs & imaging results that were available during my care of the patient were reviewed by me and considered in my medical decision making (see chart for details).    MDM Rules/Calculators/A&P                          Kevin Glass is a 86 y.o. male  past medical history significant for recent diagnosis of myasthenia gravis last month, GERD, BPH, and prior diverticulosis who presents with fatigue and productive cough.  Patient says that for the  last 3 days, he has had a cough that has been rapidly worsening.  He says his granddaughter has had a cough as well but is unsure if this is where he got it from.  He is not vaccinated against COVID-19.  He reports no chest pain and denies shortness of breath initially however he did tell nursing at one point he had some shortness of breath.  He does report feeling tired.  He denies any new leg pain and reports his leg swelling is at his baseline.  He denies trauma.  He denies fevers or chills but does feel warm to the touch on arrival.  Abdomen is nontender.  Denies nausea, vomiting, constipation, diarrhea.  On my exam, patient does have extremely rhonchorous breath sounds bilaterally with some rales in the bases bilaterally.  Chest and abdomen are nontender.  No murmur.  Legs have pitting edema in both legs which she reports is at his baseline.  Patient has intact grip strength and sensation in all extremities.  Symmetric smile.  Patient denies diplopia after being started on the medications for his myasthenia.  Clinically I am somewhat concerned that patient has either pneumonia or viral infection or even COVID-19.  With his edema in the legs and rales in the lungs, we will get a BNP although he has no history of CHF.  I am also concerned that given his age and this rapidly worsening cough and his tachycardia and tachypnea, patient may have a viral infection or other infection that could cause myasthenia crisis.  We will closely watch his vital signs.  We will try to ambulate with pulse oximetry and try to get a negative expiratory force if the equipment is available at this facility to check it.  Anticipate reassessment after work-up to determine disposition.  Patient's work-up began to return.  We found  several problems.  Today we found he is positive for COVID-19.  He also had elevated BNP likely reflecting some of the edema in the legs and crackles in the lungs.  His chest x-ray showed evidence of edema and potential infection and this is likely from the Covid.  Patient was observed to have episodes of ectopy with occasional V. tach lasting for several beats.  He also had intermittent episodes of hypoxia with oxygen saturations dipping into the 80s at times at rest.  Due to his new myasthenia gravis diagnosis, the intermittent hypoxia, new COVID-19 diagnosis, and his ectopy with his heart rhythm, he was offered admission.  Patient had a long discussion with family and I had a discussion with both a son and a daughter and they would like the patient to go home.  Patient says he does not want to be admitted and understands that he could die in the next several days given the myasthenia, his age of 63, and the new COVID-19 diagnosis.  Patient does not want to be admitted and refuses.  He does however agree to getting the monoclonal antibody infusion prior to discharge.  Patient received the Mab infusion therapy after I confirmed that there was no contraindication given his myasthenia gravis.  Patient tolerated without reactions.  Patient was discharged to the care of his family to follow-up with his PCP.  He understands that if any symptoms change or worsen, he can return to discuss admission at that time.  He had no other questions or concerns and left AGAINST MEDICAL ADVICE for outpatient follow-up of his hypoxia, Covid, in the setting of myasthenia gravis and V. tach.  Final Clinical Impression(s) / ED Diagnoses Final diagnoses:  COVID-19  Shortness of breath  Hypoxia  V-tach ([place])    Rx / DC Orders ED Discharge Orders    None      Clinical Impression: 1. COVID-19   2. Shortness of breath   3. Hypoxia   4. V-tach ([place])     Disposition: Admit  This note was prepared with  assistance of Dragon voice recognition software. Occasional wrong-word or sound-a-like substitutions may have occurred due to the inherent limitations of voice recognition software.     Edilson Vital, Gwenyth Allegra, MD 06/07/20 [phone]

## 2020-06-07 NOTE — ED Triage Notes (Signed)
Pt c/o cough for several days. Denies SOB

## 2020-06-07 NOTE — ED Notes (Signed)
ED Provider at bedside. 

## 2020-06-07 NOTE — ED Notes (Signed)
MD had lengthy conversation with pt's son regarding risks of pt going home, as patient does not want to be admitted to the hospital.  Son and Patient are aware of the increased risks, including death.  Patient states he wants to go home.

## 2020-06-07 NOTE — Progress Notes (Signed)
Pharmacy COVID-19 Monoclonal Antibody Screening  Kevin Glass was identified as being not hospitalized with symptoms from Covid-19 on admission but an incidental positive PCR has been documented.  The patient may qualify for the use of monoclonal antibodies (mAB) for COVID-19 viral infection to prevent worsening symptoms stemming from Covid-19 infection.  The patient was identified based on a positive COVID-19 PCR and not requiring the use of supplemental oxygen at this time.  This patient meets the FDA criteria for Emergency Use Authorization of casirivimab/imdevimab or bamlanivimab/etesevimab.  Has a (+) direct SARS-CoV-2 viral test result  Is NOT hospitalized due to COVID-19  Is within 10 days of symptom onset  Has at least one of the high risk factor(s) for progression to severe COVID-19 and/or hospitalization as defined in EUA.  Specific high risk criteria : Older age (>/= 84 yo) and BMI > 25  Additionally: The patient has not had a positive COVID-19 PCR in the last 90 days.  The patient is unvaccinated against COVID-19.  Since the patient is unvaccinated and meets high risk criteria, the patient is eligible for mAB administration.   This eligibility and indication for treatment was discussed with the patient's physician: Dr. Sherry Ruffing  Plan: Based on the above discussion, it was decided that the patient will receive one dose of the available COVID-19 mAB combination. Pharmacy will coordinate administration timing with patient's nurse. Recommended infusion monitoring parameters communicated to the nursing team.   [name], PharmD Clinical Pharmacist  06/07/2020  12:34 PM

## 2020-06-07 NOTE — Telephone Encounter (Signed)
New DNR filled out by Dr. Etter Sjogren. DNR delivered downstairs

## 2020-06-12 ENCOUNTER — Telehealth: Payer: Self-pay

## 2020-06-12 LAB — CULTURE, BLOOD (ROUTINE X 2)
Culture: NO GROWTH
Culture: NO GROWTH
Special Requests: ADEQUATE
Special Requests: ADEQUATE

## 2020-06-12 NOTE — Telephone Encounter (Signed)
Zola Button, [place], DO _ [place] R, R  Pt refused admission to hosp and +covid-- he needs covid clinic f/u    Spoke with patients daughter, Coralee North regarding appointment.  Daughter states patient is feeling much better, only symptom currently is cough.  Advised daughter that pt will need ER f/u, provided number for COVID clinic [phone]) to schedule. Daughter states she will call now to schedule appointment.

## 2020-06-14 ENCOUNTER — Other Ambulatory Visit: Payer: Self-pay | Admitting: Family Medicine

## 2020-08-16 DIAGNOSIS — L570 Actinic keratosis: Secondary | ICD-10-CM | POA: Diagnosis not present

## 2020-08-16 DIAGNOSIS — D485 Neoplasm of uncertain behavior of skin: Secondary | ICD-10-CM | POA: Diagnosis not present

## 2020-08-16 DIAGNOSIS — C44329 Squamous cell carcinoma of skin of other parts of face: Secondary | ICD-10-CM | POA: Diagnosis not present

## 2020-09-07 ENCOUNTER — Encounter: Payer: Self-pay | Admitting: Family Medicine

## 2020-09-14 ENCOUNTER — Ambulatory Visit (INDEPENDENT_AMBULATORY_CARE_PROVIDER_SITE_OTHER): Payer: Medicare Other | Admitting: Family Medicine

## 2020-09-14 ENCOUNTER — Other Ambulatory Visit: Payer: Self-pay

## 2020-09-14 ENCOUNTER — Encounter: Payer: Self-pay | Admitting: Family Medicine

## 2020-09-14 VITALS — BP 118/80 | HR 74 | Temp 97.4°F | Resp 18 | Ht 69.0 in | Wt 193.4 lb

## 2020-09-14 DIAGNOSIS — R32 Unspecified urinary incontinence: Secondary | ICD-10-CM | POA: Diagnosis not present

## 2020-09-14 DIAGNOSIS — N401 Enlarged prostate with lower urinary tract symptoms: Secondary | ICD-10-CM

## 2020-09-14 DIAGNOSIS — R351 Nocturia: Secondary | ICD-10-CM

## 2020-09-14 DIAGNOSIS — R600 Localized edema: Secondary | ICD-10-CM

## 2020-09-14 DIAGNOSIS — R8290 Unspecified abnormal findings in urine: Secondary | ICD-10-CM | POA: Diagnosis not present

## 2020-09-14 DIAGNOSIS — N400 Enlarged prostate without lower urinary tract symptoms: Secondary | ICD-10-CM

## 2020-09-14 HISTORY — DX: Localized edema: R60.0

## 2020-09-14 LAB — COMPREHENSIVE METABOLIC PANEL
ALT: 9 U/L (ref 0–53)
AST: 13 U/L (ref 0–37)
Albumin: 4 g/dL (ref 3.5–5.2)
Alkaline Phosphatase: 69 U/L (ref 39–117)
BUN: 24 mg/dL — ABNORMAL HIGH (ref 6–23)
CO2: 31 mEq/L (ref 19–32)
Calcium: 9.3 mg/dL (ref 8.4–10.5)
Chloride: 105 mEq/L (ref 96–112)
Creatinine, Ser: 0.83 mg/dL (ref 0.40–1.50)
GFR: 74.16 mL/min (ref >60.00)
Glucose, Bld: 93 mg/dL (ref 70–99)
Potassium: 4.7 mEq/L (ref 3.5–5.1)
Sodium: 142 mEq/L (ref 135–145)
Total Bilirubin: 0.7 mg/dL (ref 0.2–1.2)
Total Protein: 6.5 g/dL (ref 6.0–8.3)

## 2020-09-14 LAB — CBC WITH DIFFERENTIAL/PLATELET
Basophils Absolute: 0 10*3/uL (ref 0.0–0.1)
Basophils Relative: 0.7 % (ref 0.0–3.0)
Eosinophils Absolute: 0.2 10*3/uL (ref 0.0–0.7)
Eosinophils Relative: 2.6 % (ref 0.0–5.0)
HCT: 43.4 % (ref 39.0–52.0)
Hemoglobin: 14.5 g/dL (ref 13.0–17.0)
Lymphocytes Relative: 32.8 % (ref 12.0–46.0)
Lymphs Abs: 2 10*3/uL (ref 0.7–4.0)
MCHC: 33.5 g/dL (ref 30.0–36.0)
MCV: 100.5 fl — ABNORMAL HIGH (ref 78.0–100.0)
Monocytes Absolute: 0.5 10*3/uL (ref 0.1–1.0)
Monocytes Relative: 7.5 % (ref 3.0–12.0)
Neutro Abs: 3.4 10*3/uL (ref 1.4–7.7)
Neutrophils Relative %: 56.4 % (ref 43.0–77.0)
Platelets: 163 10*3/uL (ref 150.0–400.0)
RBC: 4.32 Mil/uL (ref 4.22–5.81)
RDW: 13.2 % (ref 11.5–15.5)
WBC: 6.1 10*3/uL (ref 4.0–10.5)

## 2020-09-14 LAB — POC URINALSYSI DIPSTICK (AUTOMATED)
Blood, UA: NEGATIVE
Glucose, UA: NEGATIVE
Ketones, UA: NEGATIVE
Leukocytes, UA: NEGATIVE
Nitrite, UA: NEGATIVE
Protein, UA: POSITIVE — AB
Spec Grav, UA: >=1.03 — AB (ref 1.010–1.025)
Urobilinogen, UA: 0.2 E.U./dL
pH, UA: 5.5 (ref 5.0–8.0)

## 2020-09-14 LAB — PSA: PSA: 1.08 ng/mL (ref 0.10–4.00)

## 2020-09-14 NOTE — Assessment & Plan Note (Signed)
Check psa  Consider urology referral----  Consider flomax

## 2020-09-14 NOTE — Progress Notes (Signed)
Patient ID: Kevin Glass, male    DOB: 1922-11-24  Age: 87 y.o. MRN: [id number]    Subjective:  Subjective  HPI Kevin Glass presents for an office visit today accompanied by her daughter. He complains of urination issues. He reports that he loss full control of his kidney and he is experiencing incontinent. He also complains of right Le and ankle swelling. He reports that he has been elevating his right leg to relieve his symptoms. He denies any chest pain, SOB, fever, abdominal pain, cough, chills, sore throat,  back pain, HA, or N/VD at this time.    Review of Systems  Constitutional: Negative for chills, fatigue and fever.  HENT: Negative for ear pain, sinus pain and sore throat.   Eyes: Negative for pain.  Respiratory: Negative for cough and shortness of breath.   Cardiovascular: Positive for leg swelling. Negative for chest pain and palpitations.       (+) Right LE   Gastrointestinal: Negative for abdominal pain, blood in stool, constipation, diarrhea, nausea and vomiting.  Genitourinary:       (+) incontinent   Musculoskeletal:       (+) right ankle swelling   Neurological: Negative for headaches.    History Past Medical History:  Diagnosis Date  . Acid reflux   . Arthritis   . BPH (benign prostatic hypertrophy)   . Colonic polyp   . Hemorrhoid   . Vertical diplopia     He has a past surgical history that includes Total knee arthroplasty (2003); Hemorrhoid surgery; and Hernia repair (1994).   His family history includes Arthritis in his sister; Cancer in his mother; Parkinson's disease in his father.He reports that he quit smoking about 36 years ago. He has never used smokeless tobacco. He reports that he does not drink alcohol and does not use drugs.  Current Outpatient Medications on File Prior to Visit  Medication Sig Dispense Refill  . Ascorbic Acid (VITAMIN C) 500 MG tablet Take 1,000 mg by mouth daily.    [name] aspirin 81 MG tablet Take 81 mg by mouth every  other day.    [name] azelastine (ASTELIN) 0.1 % nasal spray Place 2 sprays into both nostrils 2 (two) times daily. 30 mL 6  . Cholecalciferol (VITAMIN D) 2000 UNITS CAPS Take 1 capsule by mouth daily.    [name] lactulose (CHRONULAC) 10 GM/15ML solution Take 30 mLs (20 g total) by mouth 2 (two) times daily as needed for moderate constipation. 1800 mL 1  . Omega-3 Fatty Acids (FISH OIL) 1200 MG CAPS Take by mouth.    [name] omeprazole (PRILOSEC) 20 MG capsule TAKE 1 CAPSULE BY MOUTH  DAILY 90 capsule 3  . pyridostigmine (MESTINON) 60 MG tablet Take 0.5-1 tablets (30-60 mg total) by mouth 3 (three) times daily. 90 tablet 6  . Saw Palmetto 500 MG CAPS Take 1 capsule by mouth every other day.     No current facility-administered medications on file prior to visit.     Objective:  Objective  Physical Exam Vitals and nursing note reviewed.  Constitutional:      General: He is not in acute distress.    Appearance: Normal appearance. He is well-developed. He is not ill-appearing.  HENT:     Head: Normocephalic and atraumatic.     Right Ear: External ear normal.     Left Ear: External ear normal.     Nose: Nose normal.  Eyes:     Extraocular Movements: Extraocular movements intact.  Pupils: Pupils are equal, round, and reactive to light.  Cardiovascular:     Rate and Rhythm: Normal rate and regular rhythm.     Pulses: Normal pulses.     Heart sounds: Normal heart sounds. No murmur heard. No friction rub. No gallop.   Pulmonary:     Effort: Pulmonary effort is normal. No respiratory distress.     Breath sounds: Normal breath sounds. No wheezing, rhonchi or rales.  Abdominal:     General: Bowel sounds are normal. There is no distension.     Palpations: Abdomen is soft.     Tenderness: There is no abdominal tenderness. There is no guarding or rebound.     Hernia: No hernia is present.  Genitourinary:    Prostate: Enlarged.     Comments: There is an enlarged prostate with no blood present.   Musculoskeletal:        General: Normal range of motion.     Cervical back: Normal range of motion and neck supple.     Right lower leg: Swelling present. 1+ Edema present.     Right ankle: Swelling present.     Comments: There is right LE and ankle 1+ swelling present.   Skin:    General: Skin is warm and dry.  Neurological:     Mental Status: He is alert and oriented to person, place, and time.  Psychiatric:        Behavior: Behavior normal.        Thought Content: Thought content normal.    BP 118/80 (BP Location: Right Arm, Patient Position: Sitting, Cuff Size: Normal)   Pulse 74   Temp (!) 97.4 F (36.3 C) (Oral)   Resp 18   Ht 5\' 9"  (1.753 m)   Wt 193 lb 6.4 oz (87.7 kg)   SpO2 94%   BMI 28.56 kg/m  Wt Readings from Last 3 Encounters:  09/14/20 193 lb 6.4 oz (87.7 kg)  06/07/20 190 lb 3.2 oz (86.3 kg)  04/16/20 194 lb 12.8 oz (88.4 kg)     Lab Results  Component Value Date   WBC 5.1 06/07/2020   HGB 15.6 06/07/2020   HCT 45.1 06/07/2020   PLT 169 06/07/2020   GLUCOSE 94 06/07/2020   CHOL 186 12/11/2017   TRIG 107.0 12/11/2017   HDL 43.40 12/11/2017   LDLCALC 121 (H) 12/11/2017   ALT 14 06/07/2020   AST 18 06/07/2020   NA 136 06/07/2020   K 4.3 06/07/2020   CL 99 06/07/2020   CREATININE 0.78 06/07/2020   BUN 16 06/07/2020   CO2 29 06/07/2020   TSH 4.240 04/16/2020   PSA 0.71 09/09/2019   HGBA1C 5.7 12/11/2017    DG Chest Portable 1 View  Result Date: 06/07/2020 CLINICAL DATA:  Productive cough, mild shortness of breath EXAM: PORTABLE CHEST 1 VIEW COMPARISON:  07/09/2018 FINDINGS: Chronic interstitial changes bilaterally. Suspected superimposed patchy increased density. No significant pleural effusion. No pneumothorax. Chronic elevation of the right hemidiaphragm. Stable cardiomediastinal contours. IMPRESSION: Chronic interstitial changes with suspected superimposed patchy density bilaterally, which may reflect edema or pneumonia. Electronically Signed    By: Macy Mis M.D.   On: 06/07/2020 08:32     Assessment & Plan:  Plan    No orders of the defined types were placed in this encounter.   Problem List Items Addressed This Visit      Unprioritized   BPH (benign prostatic hyperplasia)    Check psa  Consider urology referral----  Consider flomax  Lower extremity edema    Low ext edema ----  Wear compression stocks Keep legs elevated  Consider diuretics ---but pt is urinating so much they prefer not to take it        Other Visit Diagnoses    Urinary incontinence, unspecified type    -  Primary   Relevant Orders   POCT Urinalysis Dipstick (Automated) (Completed)   Urine Culture   PSA   CBC with Differential/Platelet   Comprehensive metabolic panel   Abnormal urine finding       Relevant Orders   Urine Culture   BPH associated with nocturia       Relevant Orders   PSA   CBC with Differential/Platelet   Comprehensive metabolic panel      Follow-up: Return in about 3 months (around 12/14/2020), or if symptoms worsen or fail to improve.   I,Gordon Zheng,acting as a Education administrator for [employer], DO.,have documented all relevant documentation on the behalf of Ann Held, DO,as directed by  Ann Held, DO while in the presence of [place], DO, have reviewed all documentation for this visit. The documentation on 09/14/20 for the exam, diagnosis, procedures, and orders are all accurate and complete. Ann Held, DO

## 2020-09-14 NOTE — Patient Instructions (Signed)

## 2020-09-14 NOTE — Assessment & Plan Note (Signed)
Low ext edema ----  Wear compression stocks Keep legs elevated  Consider diuretics ---but pt is urinating so much they prefer not to take it

## 2020-09-16 LAB — URINE CULTURE
MICRO NUMBER:: 11721121
Result:: NO GROWTH
SPECIMEN QUALITY:: ADEQUATE

## 2020-09-18 ENCOUNTER — Encounter: Payer: Self-pay | Admitting: Family Medicine

## 2020-09-18 MED ORDER — SOLIFENACIN SUCCINATE 5 MG PO TABS: 5.0000 mg | ORAL_TABLET | Freq: Every day | ORAL | 0 refills | Status: DC

## 2020-10-01 DIAGNOSIS — C44329 Squamous cell carcinoma of skin of other parts of face: Secondary | ICD-10-CM | POA: Diagnosis not present

## 2020-10-08 DIAGNOSIS — L905 Scar conditions and fibrosis of skin: Secondary | ICD-10-CM | POA: Diagnosis not present

## 2020-10-15 ENCOUNTER — Other Ambulatory Visit: Payer: Self-pay | Admitting: Family Medicine

## 2020-10-15 ENCOUNTER — Encounter: Payer: Self-pay | Admitting: Family Medicine

## 2020-10-18 DIAGNOSIS — H532 Diplopia: Secondary | ICD-10-CM | POA: Diagnosis not present

## 2020-10-18 DIAGNOSIS — H2513 Age-related nuclear cataract, bilateral: Secondary | ICD-10-CM | POA: Diagnosis not present

## 2020-10-18 DIAGNOSIS — H5203 Hypermetropia, bilateral: Secondary | ICD-10-CM | POA: Diagnosis not present

## 2020-11-02 ENCOUNTER — Other Ambulatory Visit: Payer: Self-pay

## 2020-11-02 MED ORDER — SOLIFENACIN SUCCINATE 5 MG PO TABS: 5.0000 mg | ORAL_TABLET | Freq: Every day | ORAL | 0 refills | Status: DC

## 2020-11-16 DIAGNOSIS — M65352 Trigger finger, left little finger: Secondary | ICD-10-CM | POA: Diagnosis not present

## 2020-12-06 DIAGNOSIS — L570 Actinic keratosis: Secondary | ICD-10-CM | POA: Diagnosis not present

## 2020-12-06 DIAGNOSIS — L821 Other seborrheic keratosis: Secondary | ICD-10-CM | POA: Diagnosis not present

## 2020-12-06 DIAGNOSIS — Z85828 Personal history of other malignant neoplasm of skin: Secondary | ICD-10-CM | POA: Diagnosis not present

## 2020-12-06 DIAGNOSIS — D692 Other nonthrombocytopenic purpura: Secondary | ICD-10-CM | POA: Diagnosis not present

## 2020-12-06 DIAGNOSIS — X32XXXS Exposure to sunlight, sequela: Secondary | ICD-10-CM | POA: Diagnosis not present

## 2020-12-06 DIAGNOSIS — D1801 Hemangioma of skin and subcutaneous tissue: Secondary | ICD-10-CM | POA: Diagnosis not present

## 2020-12-06 DIAGNOSIS — L814 Other melanin hyperpigmentation: Secondary | ICD-10-CM | POA: Diagnosis not present

## 2021-01-06 ENCOUNTER — Other Ambulatory Visit: Payer: Self-pay | Admitting: Family Medicine

## 2021-03-05 ENCOUNTER — Ambulatory Visit (INDEPENDENT_AMBULATORY_CARE_PROVIDER_SITE_OTHER): Payer: Medicare Other | Admitting: Family Medicine

## 2021-03-05 ENCOUNTER — Ambulatory Visit (HOSPITAL_BASED_OUTPATIENT_CLINIC_OR_DEPARTMENT_OTHER)
Admission: RE | Admit: 2021-03-05 | Discharge: 2021-03-05 | Disposition: A | Discharge status: Home / Self Care | Payer: Medicare Other | Source: Ambulatory Visit | Attending: Family Medicine | Admitting: Family Medicine

## 2021-03-05 ENCOUNTER — Encounter: Payer: Self-pay | Admitting: Family Medicine

## 2021-03-05 ENCOUNTER — Other Ambulatory Visit: Payer: Self-pay | Admitting: Family Medicine

## 2021-03-05 ENCOUNTER — Other Ambulatory Visit: Payer: Self-pay

## 2021-03-05 VITALS — BP 124/90 | HR 81 | Temp 98.3°F | Resp 20 | Ht 69.0 in | Wt 199.8 lb

## 2021-03-05 DIAGNOSIS — R0600 Dyspnea, unspecified: Secondary | ICD-10-CM

## 2021-03-05 DIAGNOSIS — R0609 Other forms of dyspnea: Secondary | ICD-10-CM

## 2021-03-05 DIAGNOSIS — R9431 Abnormal electrocardiogram [ECG] [EKG]: Secondary | ICD-10-CM | POA: Diagnosis not present

## 2021-03-05 DIAGNOSIS — R0602 Shortness of breath: Secondary | ICD-10-CM | POA: Diagnosis not present

## 2021-03-05 DIAGNOSIS — Z23 Encounter for immunization: Secondary | ICD-10-CM | POA: Diagnosis not present

## 2021-03-05 DIAGNOSIS — I517 Cardiomegaly: Secondary | ICD-10-CM | POA: Diagnosis not present

## 2021-03-05 DIAGNOSIS — R9389 Abnormal findings on diagnostic imaging of other specified body structures: Secondary | ICD-10-CM

## 2021-03-05 LAB — LIPID PANEL
Cholesterol: 145 mg/dL (ref 0–200)
HDL: 41.3 mg/dL (ref >39.00)
LDL Cholesterol: 68 mg/dL (ref 0–99)
NonHDL: 104.13
Total CHOL/HDL Ratio: 4
Triglycerides: 179 mg/dL — ABNORMAL HIGH (ref 0.0–149.0)
VLDL: 35.8 mg/dL (ref 0.0–40.0)

## 2021-03-05 LAB — COMPREHENSIVE METABOLIC PANEL
ALT: 10 U/L (ref 0–53)
AST: 15 U/L (ref 0–37)
Albumin: 3.9 g/dL (ref 3.5–5.2)
Alkaline Phosphatase: 61 U/L (ref 39–117)
BUN: 20 mg/dL (ref 6–23)
CO2: 33 mEq/L — ABNORMAL HIGH (ref 19–32)
Calcium: 9.4 mg/dL (ref 8.4–10.5)
Chloride: 102 mEq/L (ref 96–112)
Creatinine, Ser: 0.83 mg/dL (ref 0.40–1.50)
GFR: 73.91 mL/min (ref >60.00)
Glucose, Bld: 70 mg/dL (ref 70–99)
Potassium: 4.8 mEq/L (ref 3.5–5.1)
Sodium: 141 mEq/L (ref 135–145)
Total Bilirubin: 0.7 mg/dL (ref 0.2–1.2)
Total Protein: 6.7 g/dL (ref 6.0–8.3)

## 2021-03-05 LAB — CBC WITH DIFFERENTIAL/PLATELET
Basophils Absolute: 0 10*3/uL (ref 0.0–0.1)
Basophils Relative: 0.4 % (ref 0.0–3.0)
Eosinophils Absolute: 0.1 10*3/uL (ref 0.0–0.7)
Eosinophils Relative: 2.5 % (ref 0.0–5.0)
HCT: 44 % (ref 39.0–52.0)
Hemoglobin: 14.8 g/dL (ref 13.0–17.0)
Lymphocytes Relative: 40 % (ref 12.0–46.0)
Lymphs Abs: 2 10*3/uL (ref 0.7–4.0)
MCHC: 33.7 g/dL (ref 30.0–36.0)
MCV: 101.5 fl — ABNORMAL HIGH (ref 78.0–100.0)
Monocytes Absolute: 0.4 10*3/uL (ref 0.1–1.0)
Monocytes Relative: 7.3 % (ref 3.0–12.0)
Neutro Abs: 2.5 10*3/uL (ref 1.4–7.7)
Neutrophils Relative %: 49.8 % (ref 43.0–77.0)
Platelets: 135 10*3/uL — ABNORMAL LOW (ref 150.0–400.0)
RBC: 4.33 Mil/uL (ref 4.22–5.81)
RDW: 13.2 % (ref 11.5–15.5)
WBC: 5.1 10*3/uL (ref 4.0–10.5)

## 2021-03-05 LAB — D-DIMER, QUANTITATIVE: D-Dimer, Quant: 0.58 mcg/mL FEU — ABNORMAL HIGH (ref <0.50)

## 2021-03-05 NOTE — Patient Instructions (Signed)
Shortness of Breath, Adult Shortness of breath is when a person has trouble breathing enough air or when a person feels like she or he is having trouble breathing in enough air.Shortness of breath could be a sign of a medical problem. Follow these instructions at home:  Pay attention to any changes in your symptoms. Do not use any products that contain nicotine or tobacco, such as cigarettes, e-cigarettes, and chewing tobacco. Do not smoke. Smoking is a common cause of shortness of breath. If you need help quitting, ask your health care provider. Avoid things that can irritate your airways, such as: Mold. Dust. Air pollution. Chemical fumes. Things that can cause allergy symptoms (allergens), if you have allergies. Keep your living space clean and free of mold and dust. Rest as needed. Slowly return to your usual activities. Take over-the-counter and prescription medicines only as told by your health care provider. This includes oxygen therapy and inhaled medicines. Keep all follow-up visits as told by your health care provider. This is important. Contact a health care provider if: Your condition does not improve as soon as expected. You have a hard time doing your normal activities, even after you rest. You have new symptoms. Get help right away if: Your shortness of breath gets worse. You have shortness of breath when you are resting. You feel light-headed or you faint. You have a cough that is not controlled with medicines. You cough up blood. You have pain with breathing. You have pain in your chest, arms, shoulders, or abdomen. You have a fever. You cannot walk up stairs or exercise the way that you normally do. These symptoms may represent a serious problem that is an emergency. Do not wait to see if the symptoms will go away. Get medical help right away. Call your local emergency services (911 in the [place] Do not drive yourself to the hospital. Summary Shortness of breath is  when a person has trouble breathing enough air. It can be a sign of a medical problem. Avoid things that irritate your lungs, such as smoking, pollution, mold, and dust. Pay attention to changes in your symptoms and contact your health care provider if you have a hard time completing daily activities because of shortness of breath. This information is not intended to replace advice given to you by your health care provider. Make sure you discuss any questions you have with your healthcare provider. Document Revised: 11/02/2017 Document Reviewed: 11/02/2017 Elsevier Patient Education  2022 Elsevier Inc.  

## 2021-03-05 NOTE — Progress Notes (Signed)
Subjective:   By signing my name below, I, Shehryar Baig, attest that this documentation has been prepared under the direction and in the presence of Ann Held, DO  03/05/2021    Patient ID: Kevin Glass, male    DOB: Nov 24, 1922, 87 y.o.   MRN: [id number]  Chief Complaint  Patient presents with   Shortness of Breath    Pt having labored breathing after getting dressed in the morning and getting in out of the car. Pt states no dizziness or chest pain.     HPI Patient is in today for a office visit. He is present with his daughter during this visit.   He complains of shortness of breath after walking long distances for the past couple months and has has been worsening. His daughter reports that he becomes winded after changing clothes in the morning. He uses a cane to assist with movement. He has occasional coughing with sputum production. He denies having any fever, chest pain, or congestion at this time.  He has not seen a cardiologist prior to this visit.    Past Medical History:  Diagnosis Date   Acid reflux    Arthritis    BPH (benign prostatic hypertrophy)    Colonic polyp    Hemorrhoid    Vertical diplopia     Past Surgical History:  Procedure Laterality Date   HEMORRHOID SURGERY     HERNIA REPAIR  1994   x 2   TOTAL KNEE ARTHROPLASTY  2003   Dr Maureen Ralphs    Family History  Problem Relation Age of Onset   Cancer Mother    Parkinson's disease Father    Arthritis Sister     Social History   Socioeconomic History   Marital status: Widowed    Spouse name: Not on file   Number of children: 3   Years of education: 25   Highest education level: Not on file  Occupational History   Occupation: retired  Tobacco Use   Smoking status: Former    Types: Cigarettes    Quit date: 06/16/1984    Years since quitting: 36.7   Smokeless tobacco: Never  Vaping Use   Vaping Use: Never used  Substance and Sexual Activity   Alcohol use: No    Alcohol/week:  0.0 standard drinks    Comment: quit 1986   Drug use: No   Sexual activity: Yes    Partners: Female  Other Topics Concern   Not on file  Social History Narrative   111/1/21ives with daughter Gae Bon   No caffeine   Exercise--[employer] twice a week and walks dog   Social Determinants of Health   Financial Resource Strain: Not on file  Food Insecurity: Not on file  Transportation Needs: Not on file  Physical Activity: Not on file  Stress: Not on file  Social Connections: Not on file  Intimate Partner Violence: Not on file    Outpatient Medications Prior to Visit  Medication Sig Dispense Refill   Ascorbic Acid (VITAMIN C) 500 MG tablet Take 1,000 mg by mouth daily.     aspirin 81 MG tablet Take 81 mg by mouth every other day.     azelastine (ASTELIN) 0.1 % nasal spray Place 2 sprays into both nostrils 2 (two) times daily. 30 mL 6   Cholecalciferol (VITAMIN D) 2000 UNITS CAPS Take 1 capsule by mouth daily.     lactulose (CHRONULAC) 10 GM/15ML solution Take 30 mLs (20 g total) by mouth  2 (two) times daily as needed for moderate constipation. 1800 mL 1   Omega-3 Fatty Acids (FISH OIL) 1200 MG CAPS Take by mouth.     omeprazole (PRILOSEC) 20 MG capsule TAKE 1 CAPSULE BY MOUTH  DAILY 90 capsule 3   Saw Palmetto 500 MG CAPS Take 1 capsule by mouth every other day.     solifenacin (VESICARE) 5 MG tablet TAKE 1 TABLET BY MOUTH  DAILY 90 tablet 1   pyridostigmine (MESTINON) 60 MG tablet Take 0.5-1 tablets (30-60 mg total) by mouth 3 (three) times daily. 90 tablet 6   No facility-administered medications prior to visit.    Allergies  Allergen Reactions   Penicillins Rash    White blister on the skin     Review of Systems  Constitutional:  Negative for fever and malaise/fatigue.  HENT:  Negative for congestion.   Eyes:  Negative for blurred vision.  Respiratory:  Positive for cough (occasional), sputum production (Clear, with cough) and shortness of breath.   Cardiovascular:   Negative for chest pain, palpitations and leg swelling.  Gastrointestinal:  Negative for abdominal pain, blood in stool and nausea.  Genitourinary:  Negative for dysuria and frequency.  Musculoskeletal:  Negative for falls.  Skin:  Negative for rash.  Neurological:  Negative for dizziness, loss of consciousness and headaches.  Endo/Heme/Allergies:  Negative for environmental allergies.  Psychiatric/Behavioral:  Negative for depression. The patient is not nervous/anxious.       Objective:    Physical Exam Vitals and nursing note reviewed.  Constitutional:      General: He is not in acute distress.    Appearance: Normal appearance. He is not ill-appearing.  HENT:     Head: Normocephalic and atraumatic.     Right Ear: External ear normal.     Left Ear: External ear normal.  Eyes:     Extraocular Movements: Extraocular movements intact.     Pupils: Pupils are equal, round, and reactive to light.  Cardiovascular:     Rate and Rhythm: Normal rate. Rhythm irregular.     Heart sounds: Normal heart sounds. No murmur heard.   No gallop.     Comments: PVC's noted during ekg. More frequent than previous EKG.  Pulmonary:     Effort: Pulmonary effort is normal. No respiratory distress.     Breath sounds: Normal breath sounds. No wheezing or rales.     Comments: Oxygen levels decreased while walking. Oxygen levels are 97% at rest.  While on 2 liters oxygen his oxygen levels were stable while walking but dropped to below 90% immediately after stopping to rest. His oxygen levels were 98% while resting on 2 liters oxygen.  Skin:    General: Skin is warm and dry.  Neurological:     Mental Status: He is alert and oriented to person, place, and time.  Psychiatric:        Behavior: Behavior normal.        Judgment: Judgment normal.    BP 124/90 (BP Location: Left Arm, Patient Position: Sitting, Cuff Size: Normal)   Pulse 81   Temp 98.3 F (36.8 C) (Oral)   Resp 20   Ht 5\' 9"  (1.753 m)   Wt  199 lb 12.8 oz (90.6 kg)   SpO2 97%   BMI 29.51 kg/m  Wt Readings from Last 3 Encounters:  03/05/21 199 lb 12.8 oz (90.6 kg)  09/14/20 193 lb 6.4 oz (87.7 kg)  06/07/20 190 lb 3.2 oz (86.3 kg)  Diabetic Foot Exam - Simple   No data filed    Lab Results  Component Value Date   WBC 5.1 03/05/2021   HGB 14.8 03/05/2021   HCT 44.0 03/05/2021   PLT 135.0 (L) 03/05/2021   GLUCOSE 70 03/05/2021   CHOL 145 03/05/2021   TRIG 179.0 (H) 03/05/2021   HDL 41.30 03/05/2021   LDLCALC 68 03/05/2021   ALT 10 03/05/2021   AST 15 03/05/2021   NA 141 03/05/2021   K 4.8 03/05/2021   CL 102 03/05/2021   CREATININE 0.83 03/05/2021   BUN 20 03/05/2021   CO2 33 (H) 03/05/2021   TSH 4.240 04/16/2020   PSA 1.08 09/14/2020   HGBA1C 5.7 12/11/2017    Lab Results  Component Value Date   TSH 4.240 04/16/2020   Lab Results  Component Value Date   WBC 5.1 03/05/2021   HGB 14.8 03/05/2021   HCT 44.0 03/05/2021   MCV 101.5 (H) 03/05/2021   PLT 135.0 (L) 03/05/2021   Lab Results  Component Value Date   NA 141 03/05/2021   K 4.8 03/05/2021   CO2 33 (H) 03/05/2021   GLUCOSE 70 03/05/2021   BUN 20 03/05/2021   CREATININE 0.83 03/05/2021   BILITOT 0.7 03/05/2021   ALKPHOS 61 03/05/2021   AST 15 03/05/2021   ALT 10 03/05/2021   PROT 6.7 03/05/2021   ALBUMIN 3.9 03/05/2021   CALCIUM 9.4 03/05/2021   ANIONGAP 8 06/07/2020   GFR 73.91 03/05/2021   Lab Results  Component Value Date   CHOL 145 03/05/2021   Lab Results  Component Value Date   HDL 41.30 03/05/2021   Lab Results  Component Value Date   LDLCALC 68 03/05/2021   Lab Results  Component Value Date   TRIG 179.0 (H) 03/05/2021   Lab Results  Component Value Date   CHOLHDL 4 03/05/2021   Lab Results  Component Value Date   HGBA1C 5.7 12/11/2017       Assessment & Plan:   Problem List Items Addressed This Visit       Unprioritized   Dyspnea on exertion - Primary    Check labs Xray  Refer  cardiology desat with walking ----  Pt felt better with O2--- we can get home O2 for pt        Relevant Orders   DG Chest 2 View (Completed)   EKG 12-Lead (Completed)   Ambulatory referral to Cardiology   CBC with Differential/Platelet (Completed)   Comprehensive metabolic panel (Completed)   Lipid panel (Completed)   D-Dimer, Quantitative (Completed)   Other Visit Diagnoses     Need for influenza vaccination       Relevant Orders   Flu Vaccine QUAD High Dose(Fluad)   Abnormal EKG       Relevant Orders   Ambulatory referral to Cardiology        No orders of the defined types were placed in this encounter.   I, Ann Held, DO, personally preformed the services described in this documentation.  All medical record entries made by the scribe were at my direction and in my presence.  I have reviewed the chart and discharge instructions (if applicable) and agree that the record reflects my personal performance and is accurate and complete. 03/05/2021   I,Shehryar Baig,acting as a scribe for Ann Held, DO.,have documented all relevant documentation on the behalf of Ann Held, DO,as directed by  Ann Held, DO while in  the presence of Ann Held, DO.   Ann Held, DO

## 2021-03-06 ENCOUNTER — Encounter: Payer: Self-pay | Admitting: Family Medicine

## 2021-03-06 MED ORDER — PYRIDOSTIGMINE BROMIDE 60 MG PO TABS: 30.0000 mg | ORAL_TABLET | Freq: Three times a day (TID) | ORAL | 1 refills | Status: DC

## 2021-03-07 ENCOUNTER — Encounter (HOSPITAL_BASED_OUTPATIENT_CLINIC_OR_DEPARTMENT_OTHER): Payer: Self-pay

## 2021-03-07 ENCOUNTER — Other Ambulatory Visit: Payer: Self-pay

## 2021-03-07 ENCOUNTER — Ambulatory Visit (HOSPITAL_BASED_OUTPATIENT_CLINIC_OR_DEPARTMENT_OTHER)
Admission: RE | Admit: 2021-03-07 | Discharge: 2021-03-07 | Disposition: A | Discharge status: Home / Self Care | Payer: Medicare Other | Source: Ambulatory Visit | Attending: Family Medicine | Admitting: Family Medicine

## 2021-03-07 DIAGNOSIS — R0600 Dyspnea, unspecified: Secondary | ICD-10-CM | POA: Diagnosis not present

## 2021-03-07 DIAGNOSIS — R0609 Other forms of dyspnea: Secondary | ICD-10-CM

## 2021-03-07 DIAGNOSIS — R7989 Other specified abnormal findings of blood chemistry: Secondary | ICD-10-CM | POA: Diagnosis not present

## 2021-03-07 DIAGNOSIS — R0602 Shortness of breath: Secondary | ICD-10-CM | POA: Diagnosis not present

## 2021-03-07 MED ORDER — IOHEXOL 350 MG/ML SOLN
100.0000 mL | Freq: Once | INTRAVENOUS | Status: AC | PRN
  Administered 2021-03-07: 75 mL via INTRAVENOUS

## 2021-03-08 DIAGNOSIS — R0600 Dyspnea, unspecified: Secondary | ICD-10-CM | POA: Insufficient documentation

## 2021-03-08 DIAGNOSIS — R0609 Other forms of dyspnea: Secondary | ICD-10-CM

## 2021-03-08 HISTORY — DX: Other forms of dyspnea: R06.09

## 2021-03-08 NOTE — Assessment & Plan Note (Signed)
Check labs Xray  Refer cardiology desat with walking ----  Pt felt better with O2--- we can get home O2 for pt

## 2021-03-13 ENCOUNTER — Other Ambulatory Visit: Payer: Self-pay

## 2021-03-13 DIAGNOSIS — R0600 Dyspnea, unspecified: Secondary | ICD-10-CM

## 2021-03-13 DIAGNOSIS — J849 Interstitial pulmonary disease, unspecified: Secondary | ICD-10-CM

## 2021-03-13 DIAGNOSIS — R0602 Shortness of breath: Secondary | ICD-10-CM

## 2021-03-13 DIAGNOSIS — R0609 Other forms of dyspnea: Secondary | ICD-10-CM

## 2021-03-13 NOTE — Addendum Note (Signed)
Addended by: Kem Boroughs D on: 03/13/2021 11:07 AM   Modules accepted: Orders

## 2021-04-03 ENCOUNTER — Other Ambulatory Visit: Payer: Self-pay

## 2021-04-03 DIAGNOSIS — K219 Gastro-esophageal reflux disease without esophagitis: Secondary | ICD-10-CM | POA: Insufficient documentation

## 2021-04-03 DIAGNOSIS — K635 Polyp of colon: Secondary | ICD-10-CM | POA: Insufficient documentation

## 2021-04-03 DIAGNOSIS — H532 Diplopia: Secondary | ICD-10-CM | POA: Insufficient documentation

## 2021-04-18 ENCOUNTER — Ambulatory Visit: Payer: Medicare Other | Admitting: Cardiology

## 2021-04-29 IMAGING — DX DG CHEST 1V PORT
1 series · 1 of 1 positions shown · non-contrast
Comparison: 07/09/2018

CLINICAL DATA: Productive cough, mild shortness of breath

EXAM:
PORTABLE CHEST 1 VIEW

[chest ap]
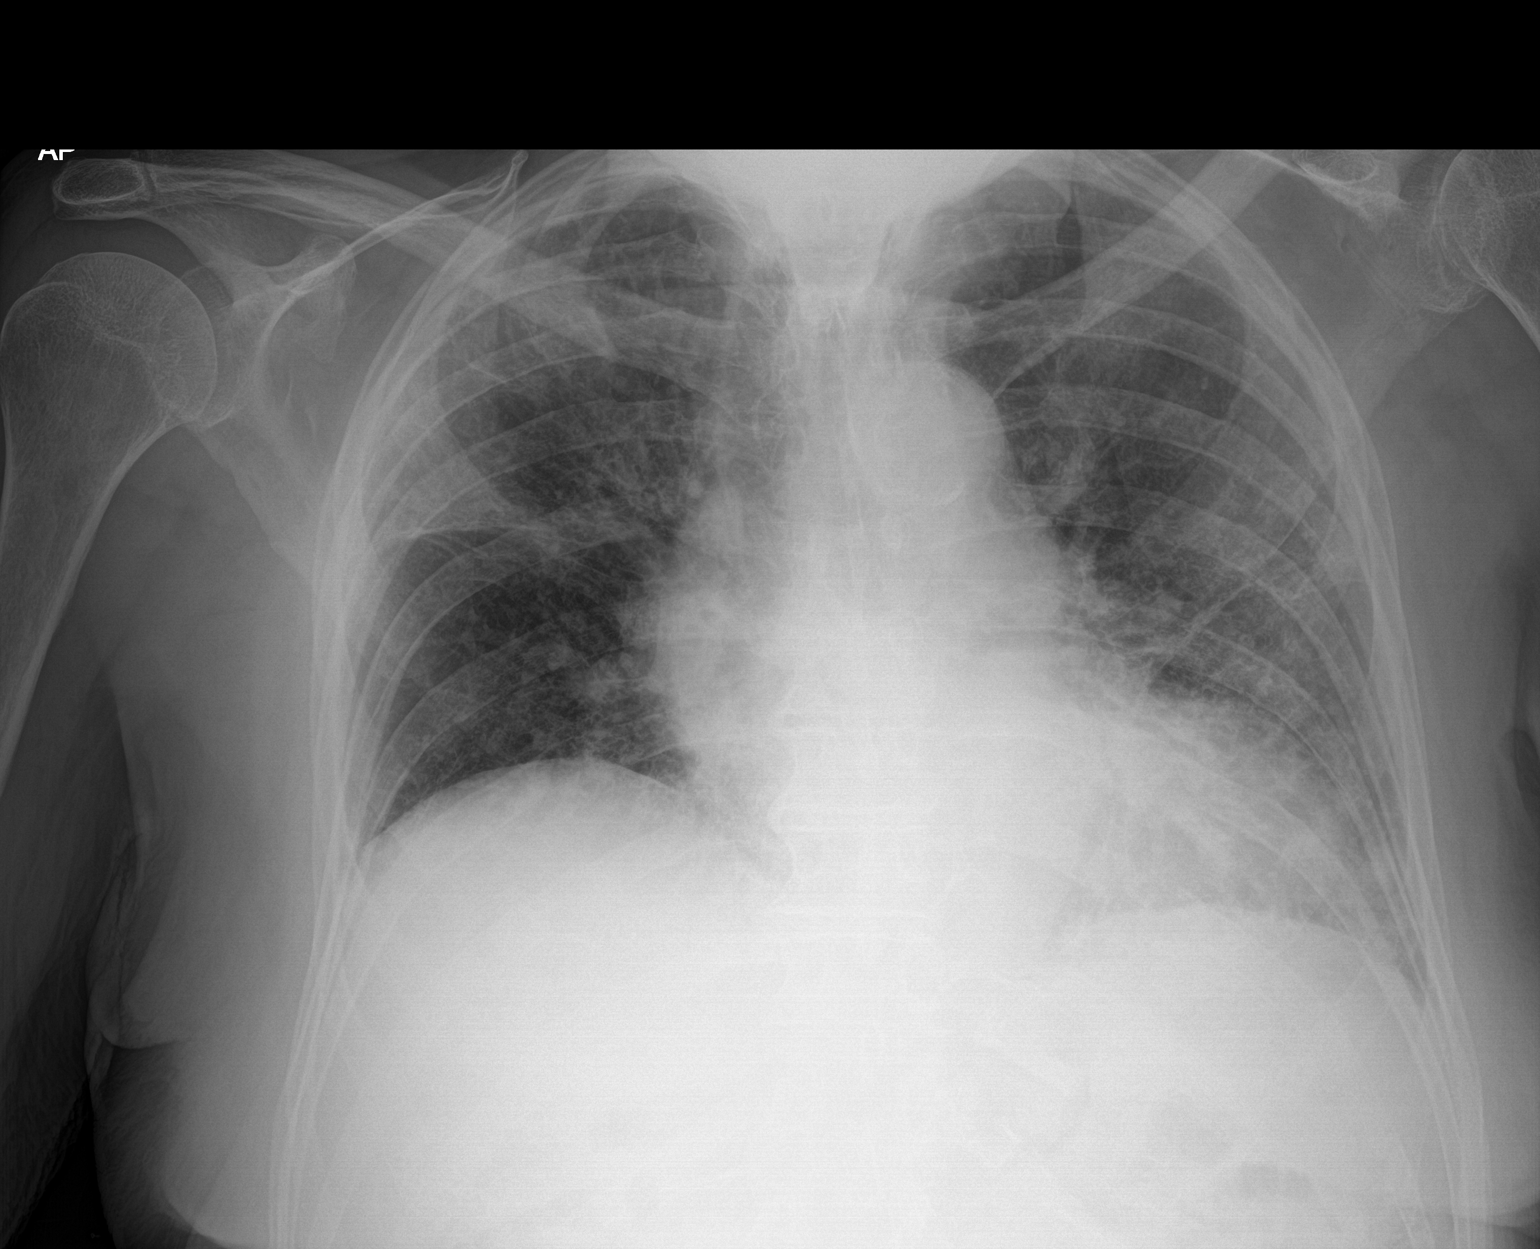

[1 of 1 positions shown; findings below may reference images not displayed]

FINDINGS: Chronic interstitial changes bilaterally. Suspected superimposed
patchy increased density. No significant pleural effusion. No
pneumothorax. Chronic elevation of the right hemidiaphragm. Stable
cardiomediastinal contours.
IMPRESSION: Chronic interstitial changes with suspected superimposed patchy
density bilaterally, which may reflect edema or pneumonia.

## 2021-05-07 ENCOUNTER — Other Ambulatory Visit: Payer: Self-pay | Admitting: Diagnostic Neuroimaging

## 2021-05-16 ENCOUNTER — Other Ambulatory Visit: Payer: Self-pay | Admitting: Physician Assistant

## 2021-05-27 DIAGNOSIS — Z85828 Personal history of other malignant neoplasm of skin: Secondary | ICD-10-CM | POA: Diagnosis not present

## 2021-05-27 DIAGNOSIS — D485 Neoplasm of uncertain behavior of skin: Secondary | ICD-10-CM | POA: Diagnosis not present

## 2021-05-27 DIAGNOSIS — L570 Actinic keratosis: Secondary | ICD-10-CM | POA: Diagnosis not present

## 2021-05-27 DIAGNOSIS — D1801 Hemangioma of skin and subcutaneous tissue: Secondary | ICD-10-CM | POA: Diagnosis not present

## 2021-05-27 DIAGNOSIS — L821 Other seborrheic keratosis: Secondary | ICD-10-CM | POA: Diagnosis not present

## 2021-05-27 DIAGNOSIS — L814 Other melanin hyperpigmentation: Secondary | ICD-10-CM | POA: Diagnosis not present

## 2021-05-27 DIAGNOSIS — X32XXXS Exposure to sunlight, sequela: Secondary | ICD-10-CM | POA: Diagnosis not present

## 2021-06-03 ENCOUNTER — Ambulatory Visit: Payer: Medicare Other | Admitting: Family Medicine

## 2021-06-04 ENCOUNTER — Other Ambulatory Visit: Payer: Self-pay | Admitting: Family Medicine

## 2021-06-18 ENCOUNTER — Other Ambulatory Visit: Payer: Self-pay | Admitting: Diagnostic Neuroimaging

## 2021-06-20 ENCOUNTER — Encounter: Payer: Self-pay | Admitting: Family Medicine

## 2021-06-20 ENCOUNTER — Telehealth: Payer: Self-pay | Admitting: Family Medicine

## 2021-06-20 ENCOUNTER — Ambulatory Visit (INDEPENDENT_AMBULATORY_CARE_PROVIDER_SITE_OTHER): Payer: Medicare Other | Admitting: Family Medicine

## 2021-06-20 VITALS — BP 118/78 | HR 83 | Temp 97.5°F | Resp 20 | Ht 69.0 in | Wt 198.6 lb

## 2021-06-20 DIAGNOSIS — R0609 Other forms of dyspnea: Secondary | ICD-10-CM | POA: Diagnosis not present

## 2021-06-20 DIAGNOSIS — R0602 Shortness of breath: Secondary | ICD-10-CM | POA: Diagnosis not present

## 2021-06-20 DIAGNOSIS — R0902 Hypoxemia: Secondary | ICD-10-CM | POA: Insufficient documentation

## 2021-06-20 NOTE — Progress Notes (Signed)
Established Patient Office Visit  Subjective:  Patient ID: Kevin Glass, male    DOB: 01/31/1923  Age: 88 y.o. MRN: [id number]  CC:  Chief Complaint  Patient presents with   Shortness of Breath    SOB has improved.    Follow-up    HPI Kevin Glass presents for f/u sob.  Pt daughter is with him.  He states he feels better but still gets very sob with exertion.  No cp , palp.    Past Medical History:  Diagnosis Date   Acid reflux    ARTHRITIS 08/26/2007   Qualifier: Diagnosis of  By: Ronnald Ramp RN, CGRN, Sheri     Balance problem 06/13/2019   BPH (benign prostatic hyperplasia) 02/23/2007   Qualifier: Diagnosis of  By: Heath Lark     Colonic polyp    COLONIC POLYPS 05/04/2007   Qualifier: Diagnosis of  By: Ronnald Ramp RN, CGRN, Sheri     Constipation 09/18/2014   DIVERTICULOSIS, COLON 08/26/2007   Qualifier: Diagnosis of  By: Ronnald Ramp RN, CGRN, Sheri     Dizziness and giddiness 12/18/2009   Qualifier: Diagnosis of  By: Larose Kells MD, [place]    Dyspepsia 04/09/2016   Dyspnea on exertion 03/08/2021   Finger pain, right 06/13/2019   Gastroesophageal reflux disease 12/26/2019   Height loss 06/13/2019   HEMORRHOIDS 08/26/2007   Qualifier: Diagnosis of  By: Ronnald Ramp RN, CGRN, Sheri     IMPAIRED GLUCOSE TOLERANCE 02/23/2007   Qualifier: Diagnosis of  By: Heath Lark     Lower extremity edema 09/14/2020   Neck pain 06/13/2019   NONSPECIFIC ABNORMAL ELECTROCARDIOGRAM 06/27/2009   Qualifier: Diagnosis of  By: Larose Kells MD, [place]    Obesity (BMI 30-39.9) 05/27/2013   Post-nasal drainage 10/28/2018   Last Assessment & Plan:  Formatting of this note might be different from the original. Concern over rhinorrhea. Significant anterior rhinorrhea.  Daily.  Ipratropium helped but when he stopped it his symptoms recurred. EXAMINATION shows no intranasal polyps or purulence. PLAN: We discussed vasomotor rhinitis in detail we never cure it.  If he wants to control the symptoms he has to use the medicat   Pressure injury  of right buttock, stage 1 12/26/2019   Preventative health care 04/05/2014   Wellness labs, vaccines, procedures are up to date will only order future lipid panel. Pt encouraged to get hearing aid through [place].  Also flu vaccine given today.   Right knee pain 09/21/2014   Tendinopathy of rotator cuff 01/02/2015   Thoracic spine pain 06/13/2019   Urinary frequency 12/26/2019   Vertical diplopia    Viral URI 11/21/2010    Past Surgical History:  Procedure Laterality Date   HEMORRHOID SURGERY     HERNIA REPAIR  1994   x 2   TOTAL KNEE ARTHROPLASTY  2003   Dr Maureen Ralphs    Family History  Problem Relation Age of Onset   Cancer Mother    Parkinson's disease Father    Arthritis Sister     Social History   Socioeconomic History   Marital status: Widowed    Spouse name: Not on file   Number of children: 3   Years of education: 57   Highest education level: Not on file  Occupational History   Occupation: retired  Tobacco Use   Smoking status: Former    Types: Cigarettes    Quit date: 06/16/1984    Years since quitting: 37.0   Smokeless tobacco: Never  Vaping Use  Vaping Use: Never used  Substance and Sexual Activity   Alcohol use: No    Alcohol/week: 0.0 standard drinks    Comment: quit 1986   Drug use: No   Sexual activity: Yes    Partners: Female  Other Topics Concern   Not on file  Social History Narrative   111/1/21ives with daughter Gae Bon   No caffeine   Exercise--[employer] twice a week and walks dog   Social Determinants of Health   Financial Resource Strain: Not on file  Food Insecurity: Not on file  Transportation Needs: Not on file  Physical Activity: Not on file  Stress: Not on file  Social Connections: Not on file  Intimate Partner Violence: Not on file    Outpatient Medications Prior to Visit  Medication Sig Dispense Refill   Ascorbic Acid (VITAMIN C) 500 MG tablet Take 1,000 mg by mouth daily.     aspirin 81 MG tablet Take 81 mg by mouth every other day.      azelastine (ASTELIN) 0.1 % nasal spray Place 2 sprays into both nostrils 2 (two) times daily. 30 mL 6   Cholecalciferol (VITAMIN D) 2000 UNITS CAPS Take 1 capsule by mouth daily.     ipratropium (ATROVENT) 0.06 % nasal spray Place 1 spray into both nostrils as needed for allergies or rhinitis.     lactulose (CHRONULAC) 10 GM/15ML solution Take 30 mLs (20 g total) by mouth 2 (two) times daily as needed for moderate constipation. 1800 mL 1   Omega-3 Fatty Acids (FISH OIL) 1200 MG CAPS Take 1,200 mg by mouth daily.     omeprazole (PRILOSEC) 20 MG capsule Take 1 capsule (20 mg total) by mouth daily. **PLEASE CALL OFFICE TO SCHEDULE FOLLOW UP 90 capsule 0   pyridostigmine (MESTINON) 60 MG tablet TAKE 1/2 TO 1 TABLET BY  MOUTH 3 TIMES DAILY 100 tablet 0   Saw Palmetto 500 MG CAPS Take 1 capsule by mouth every other day.     solifenacin (VESICARE) 5 MG tablet TAKE 1 TABLET BY MOUTH  DAILY 90 tablet 1   No facility-administered medications prior to visit.    Allergies  Allergen Reactions   Penicillins Rash    White blister on the skin     ROS Review of Systems  Constitutional: Negative.   HENT:  Negative for congestion, ear pain, hearing loss, nosebleeds, postnasal drip, rhinorrhea, sinus pressure, sneezing and tinnitus.   Eyes:  Negative for photophobia, discharge, itching and visual disturbance.  Respiratory:  Positive for shortness of breath. Negative for wheezing.   Cardiovascular: Negative.   Gastrointestinal:  Negative for abdominal distention, abdominal pain, anal bleeding, blood in stool and constipation.  Endocrine: Negative.   Genitourinary: Negative.   Musculoskeletal: Negative.   Skin: Negative.   Allergic/Immunologic: Negative.   Neurological:  Negative for dizziness, weakness, light-headedness, numbness and headaches.  Psychiatric/Behavioral:  Negative for agitation, confusion, decreased concentration, dysphoric mood, sleep disturbance and suicidal ideas. The patient is  not nervous/anxious.      Objective:    Physical Exam Vitals and nursing note reviewed.  Constitutional:      Appearance: He is well-developed.  HENT:     Head: Normocephalic and atraumatic.  Eyes:     Pupils: Pupils are equal, round, and reactive to light.  Neck:     Thyroid: No thyromegaly.  Cardiovascular:     Rate and Rhythm: Normal rate and regular rhythm.     Heart sounds: Murmur heard.  Pulmonary:  Effort: Pulmonary effort is normal. No respiratory distress.     Breath sounds: Normal breath sounds. No wheezing or rales.  Chest:     Chest wall: No tenderness.  Musculoskeletal:        General: No tenderness.     Cervical back: Normal range of motion and neck supple.  Skin:    General: Skin is warm and dry.  Neurological:     Mental Status: He is alert and oriented to person, place, and time.  Psychiatric:        Behavior: Behavior normal.        Thought Content: Thought content normal.        Judgment: Judgment normal.    BP 118/78 (BP Location: Right Arm, Patient Position: Sitting, Cuff Size: Normal)    Pulse 83    Temp (!) 97.5 F (36.4 C) (Oral)    Resp 20    Ht 5\' 9"  (1.753 m)    Wt 198 lb 9.6 oz (90.1 kg)    SpO2 (!) 86%    BMI 29.33 kg/m  Wt Readings from Last 3 Encounters:  06/20/21 198 lb 9.6 oz (90.1 kg)  03/05/21 199 lb 12.8 oz (90.6 kg)  09/14/20 193 lb 6.4 oz (87.7 kg)  At rest pulse ox 98% on ra Walking it dropped to 86% Walking with 2L O2 92%   Health Maintenance Due  Topic Date Due   COVID-19 Vaccine (1) Never done   TETANUS/TDAP  07/31/2014    There are no preventive care reminders to display for this patient.  Lab Results  Component Value Date   TSH 4.240 04/16/2020   Lab Results  Component Value Date   WBC 5.1 03/05/2021   HGB 14.8 03/05/2021   HCT 44.0 03/05/2021   MCV 101.5 (H) 03/05/2021   PLT 135.0 (L) 03/05/2021   Lab Results  Component Value Date   NA 141 03/05/2021   K 4.8 03/05/2021   CO2 33 (H) 03/05/2021    GLUCOSE 70 03/05/2021   BUN 20 03/05/2021   CREATININE 0.83 03/05/2021   BILITOT 0.7 03/05/2021   ALKPHOS 61 03/05/2021   AST 15 03/05/2021   ALT 10 03/05/2021   PROT 6.7 03/05/2021   ALBUMIN 3.9 03/05/2021   CALCIUM 9.4 03/05/2021   ANIONGAP 8 06/07/2020   GFR 73.91 03/05/2021   Lab Results  Component Value Date   CHOL 145 03/05/2021   Lab Results  Component Value Date   HDL 41.30 03/05/2021   Lab Results  Component Value Date   LDLCALC 68 03/05/2021   Lab Results  Component Value Date   TRIG 179.0 (H) 03/05/2021   Lab Results  Component Value Date   CHOLHDL 4 03/05/2021   Lab Results  Component Value Date   HGBA1C 5.7 12/11/2017      Assessment & Plan:   Problem List Items Addressed This Visit   None Visit Diagnoses     DOE (dyspnea on exertion)    -  Primary   Relevant Orders   Pulse oximetry, overnight   SOB (shortness of breath)       Relevant Orders   Pulse oximetry, overnight       No orders of the defined types were placed in this encounter.   Follow-up: Return in about 6 months (around 12/18/2021), or if symptoms worsen or fail to improve, for annual exam, fasting.    Ann Held, DO

## 2021-06-20 NOTE — Telephone Encounter (Signed)
[facility] called stating they need chart notes, and also the diagnoses on it is not a qualifying diagnosis. They need the corrected diagnosis and the chart notes that go with it.   Call back: [phone]

## 2021-06-20 NOTE — Patient Instructions (Signed)
Shortness of Breath, Adult Shortness of breath is when a person has trouble breathing or when a person feels like she or he is having trouble breathing in enough air. Shortness of breath could be a sign of a medical problem. Follow these instructions at home: Pollutants Do not use any products that contain nicotine or tobacco. These products include cigarettes, chewing tobacco, and vaping devices, such as e-cigarettes. This also includes cigars and pipes. If you need help quitting, ask your health care provider. Avoid things that can irritate your airways, including: Smoke. This includes campfire smoke, forest fire smoke, and secondhand smoke from tobacco products. Do not smoke or allow others to smoke in your home. Mold. Dust. Air pollution. Chemical fumes. Things that can give you an allergic reaction (allergens) if you have allergies. Common allergens include pollen from grasses or trees and animal dander. Keep your living space clean and free of mold and dust. General instructions Pay attention to any changes in your symptoms. Take over-the-counter and prescription medicines only as told by your health care provider. This includes oxygen therapy and inhaled medicines. Rest as needed. Return to your normal activities as told by your health care provider. Ask your health care provider what activities are safe for you. Keep all follow-up visits. This is important. Contact a health care provider if: Your condition does not improve as soon as expected. You have a hard time doing your normal activities, even after you rest. You have new symptoms. You cannot walk up stairs or exercise the way that you normally do. Get help right away if: Your shortness of breath gets worse. You have shortness of breath when you are resting. You feel light-headed or you faint. You have a cough that is not controlled with medicines. You cough up blood. You have pain with breathing. You have pain in your  chest, arms, shoulders, or abdomen. You have a fever. These symptoms may be an emergency. Get help right away. Call 911. Do not wait to see if the symptoms will go away. Do not drive yourself to the hospital. Summary Shortness of breath is when a person has trouble breathing enough air. It can be a sign of a medical problem. Avoid things that irritate your lungs, such as smoking, pollution, mold, and dust. Pay attention to changes in your symptoms and contact your health care provider if you have a hard time completing daily activities because of shortness of breath. This information is not intended to replace advice given to you by your health care provider. Make sure you discuss any questions you have with your health care provider. Document Revised: 01/19/2021 Document Reviewed: 01/19/2021 Elsevier Patient Education  2022 Elsevier Inc.  

## 2021-06-20 NOTE — Assessment & Plan Note (Signed)
Will order oxygen 2L [place] prn at home Will get ONO as well Pt has f/u with pulmonary coming up Call or rto prn

## 2021-06-21 NOTE — Telephone Encounter (Signed)
Notes and Dx faxed again

## 2021-06-25 NOTE — Telephone Encounter (Signed)
[facility] called and stated they refaxed the corrected order and also need walking test results as well.  [phone]

## 2021-06-26 NOTE — Telephone Encounter (Signed)
Form faxed back on 06/25/21

## 2021-06-26 NOTE — Telephone Encounter (Signed)
Terry from [facility] called again and needs test results from walk test faxed. As well as form re-faxed. Please advise.

## 2021-06-27 NOTE — Telephone Encounter (Signed)
Spoke with Terri at [facility] and they needed the walk test documented in the ov note.  Advised that it was in note but I will refax note with walk test circled.  Note faxed.

## 2021-07-04 DIAGNOSIS — J849 Interstitial pulmonary disease, unspecified: Secondary | ICD-10-CM | POA: Diagnosis not present

## 2021-07-04 DIAGNOSIS — J479 Bronchiectasis, uncomplicated: Secondary | ICD-10-CM | POA: Diagnosis not present

## 2021-07-09 ENCOUNTER — Other Ambulatory Visit: Payer: Self-pay

## 2021-07-09 ENCOUNTER — Encounter: Payer: Self-pay | Admitting: Pulmonary Disease

## 2021-07-09 ENCOUNTER — Ambulatory Visit (INDEPENDENT_AMBULATORY_CARE_PROVIDER_SITE_OTHER): Payer: Medicare Other | Admitting: Pulmonary Disease

## 2021-07-09 VITALS — BP 130/62 | HR 58 | Temp 97.6°F | Ht 72.0 in | Wt 198.8 lb

## 2021-07-09 DIAGNOSIS — J849 Interstitial pulmonary disease, unspecified: Secondary | ICD-10-CM | POA: Diagnosis not present

## 2021-07-09 NOTE — Patient Instructions (Addendum)
I reviewed your scans which showed lung scarring.  I believe you have a condition called idiopathic pulmonary fibrosis or IPF which could be a progressive disease. Usually the treatment or medications that can slow this progression over time but they can have side effects Per our discussion we have held off on further work-up and assessment while you discuss this with your family Return to clinic in 1 to 2 months

## 2021-07-10 DIAGNOSIS — J849 Interstitial pulmonary disease, unspecified: Secondary | ICD-10-CM | POA: Insufficient documentation

## 2021-07-10 NOTE — Progress Notes (Signed)
Kevin Glass    [id number]    October 10, 1922  Primary Care Physician:Kevin Cheri Rous [name], DO  Referring Physician: Carollee Glass, [name], DO [address] [place],  [place] [address]  Chief complaint: Consult for interstitial lung disease  HPI: 88 year old with history of allergies, BPH, arthritis Here for evaluation of dyspnea and abnormal CT scan He has dyspnea on exertion for the past several years.  No symptoms at rest.  Denies any cough, sputum production, fevers, chills  Pets: Dogs Occupation: Used to work in Magazine features editor Exposures: No mold, hot tub, Customer service manager.  No feather pillows or comforter ILD questionnaire 07/09/2021- Negative Smoking history: Used to smoke pipe and cigar.  Quit in 1985 Travel history: Previously lived in [place].  No significant recent travel Relevant family history: No family history of lung disease  Outpatient Encounter Medications as of 07/09/2021  Medication Sig   Ascorbic Acid (VITAMIN C) 500 MG tablet Take 1,000 mg by mouth daily.   aspirin 81 MG tablet Take 81 mg by mouth every other day.   azelastine (ASTELIN) 0.1 % nasal spray Place 2 sprays into both nostrils 2 (two) times daily.   Cholecalciferol (VITAMIN D) 2000 UNITS CAPS Take 1 capsule by mouth daily.   ipratropium (ATROVENT) 0.06 % nasal spray Place 1 spray into both nostrils as needed for allergies or rhinitis.   lactulose (CHRONULAC) 10 GM/15ML solution Take 30 mLs (20 g total) by mouth 2 (two) times daily as needed for moderate constipation.   Omega-3 Fatty Acids (FISH OIL) 1200 MG CAPS Take 1,200 mg by mouth daily.   omeprazole (PRILOSEC) 20 MG capsule Take 1 capsule (20 mg total) by mouth daily. **PLEASE CALL OFFICE TO SCHEDULE FOLLOW UP   pyridostigmine (MESTINON) 60 MG tablet TAKE 1/2 TO 1 TABLET BY  MOUTH 3 TIMES DAILY   Saw Palmetto 500 MG CAPS Take 1 capsule by mouth every other day.   solifenacin (VESICARE) 5 MG tablet TAKE 1 TABLET BY MOUTH  DAILY    No facility-administered encounter medications on file as of 07/09/2021.    Allergies as of 07/09/2021 - Review Complete 07/09/2021  Allergen Reaction Noted   Penicillins Rash 12/22/2007    Past Medical History:  Diagnosis Date   Acid reflux    ARTHRITIS 08/26/2007   Qualifier: Diagnosis of  By: Kevin Glass     Balance problem 06/13/2019   BPH (benign prostatic hyperplasia) 02/23/2007   Qualifier: Diagnosis of  By: Kevin Glass     Colonic polyp    COLONIC POLYPS 05/04/2007   Qualifier: Diagnosis of  By: Kevin Glass     Constipation 09/18/2014   DIVERTICULOSIS, COLON 08/26/2007   Qualifier: Diagnosis of  By: Kevin Glass     Dizziness and giddiness 12/18/2009   Qualifier: Diagnosis of  By: Kevin Glass    Dyspepsia 04/09/2016   Dyspnea on exertion 03/08/2021   Finger pain, right 06/13/2019   Gastroesophageal reflux disease 12/26/2019   Height loss 06/13/2019   HEMORRHOIDS 08/26/2007   Qualifier: Diagnosis of  By: Kevin Glass     IMPAIRED GLUCOSE TOLERANCE 02/23/2007   Qualifier: Diagnosis of  By: Kevin Glass     Lower extremity edema 09/14/2020   Neck pain 06/13/2019   NONSPECIFIC ABNORMAL ELECTROCARDIOGRAM 06/27/2009   Qualifier: Diagnosis of  By: Kevin Glass    Obesity (BMI 30-39.9) 05/27/2013  Post-nasal drainage 10/28/2018   Last Assessment & Plan:  Formatting of this note might be different from the original. Concern over rhinorrhea. Significant anterior rhinorrhea.  Daily.  Ipratropium helped but when he stopped it his symptoms recurred. EXAMINATION shows no intranasal polyps or purulence. PLAN: We discussed vasomotor rhinitis in detail we never cure it.  If he wants to control the symptoms he has to use the medicat   Pressure injury of right buttock, stage 1 12/26/2019   Preventative health care 04/05/2014   Wellness labs, vaccines, procedures are up to date will only order future lipid panel. Pt encouraged to get hearing aid through  [place].  Also flu vaccine given today.   Right knee pain 09/21/2014   Tendinopathy of rotator cuff 01/02/2015   Thoracic spine pain 06/13/2019   Urinary frequency 12/26/2019   Vertical diplopia    Viral URI 11/21/2010    Past Surgical History:  Procedure Laterality Date   HEMORRHOID SURGERY     HERNIA REPAIR  1994   x 2   TOTAL KNEE ARTHROPLASTY  2003   Dr Kevin Glass    Family History  Problem Relation Age of Onset   Cancer Mother    Parkinson's disease Father    Arthritis Sister     Social History   Socioeconomic History   Marital status: Widowed    Spouse name: Not on file   Number of children: 3   Years of education: 25   Highest education level: Not on file  Occupational History   Occupation: retired  Tobacco Use   Smoking status: Former    Types: Cigarettes    Quit date: 06/16/1984    Years since quitting: 37.0   Smokeless tobacco: Never  Vaping Use   Vaping Use: Never used  Substance and Sexual Activity   Alcohol use: No    Alcohol/week: 0.0 standard drinks    Comment: quit 1986   Drug use: No   Sexual activity: Yes    Partners: Female  Other Topics Concern   Not on file  Social History Narrative   111/1/21ives with daughter Kevin Glass   No caffeine   Exercise--[employer] twice a week and walks dog   Social Determinants of Health   Financial Resource Strain: Not on file  Food Insecurity: Not on file  Transportation Needs: Not on file  Physical Activity: Not on file  Stress: Not on file  Social Connections: Not on file  Intimate Partner Violence: Not on file    Review of systems: Review of Systems  Constitutional: Negative for fever and chills.  HENT: Negative.   Eyes: Negative for blurred vision.  Respiratory: as per HPI  Cardiovascular: Negative for chest pain and palpitations.  Gastrointestinal: Negative for vomiting, diarrhea, blood per rectum. Genitourinary: Negative for dysuria, urgency, frequency and hematuria.  Musculoskeletal: Negative for myalgias,  back pain and joint pain.  Skin: Negative for itching and rash.  Neurological: Negative for dizziness, tremors, focal weakness, seizures and loss of consciousness.  Endo/Heme/Allergies: Negative for environmental allergies.  Psychiatric/Behavioral: Negative for depression, suicidal ideas and hallucinations.  All other systems reviewed and are negative.  Physical Exam: Blood pressure 130/62, pulse (!) 58, temperature 97.6 F (36.4 C), temperature source Oral, height 6' (1.829 m), weight 198 lb 12.8 oz (90.2 kg), SpO2 100 %. Gen:      No acute distress HEENT:  EOMI, sclera anicteric Neck:     No masses; no thyromegaly Lungs:    Clear to auscultation bilaterally; normal respiratory  effort CV:         Regular rate and rhythm; no murmurs Abd:      + bowel sounds; soft, non-tender; no palpable masses, no distension Ext:    No edema; adequate peripheral perfusion Skin:      Warm and dry; no rash Neuro: alert and oriented x 3 Psych: normal mood and affect  Data Reviewed: Imaging: CT chest 09/20/2011-mild reticulation in the periphery in the base, bronchiectasis in the right lower lobe CTA 03/07/2021-patchy areas of reticulation, traction bronchiectasis and honeycombing at the base UIP pattern pulmonary fibrosis.  I have reviewed the images personally.  PFTs:  Labs:  Assessment:  Assessment for interstitial lung disease CT from last year noted with pulmonary fibrosis  in UIP pattern.  This has progressed from 2013 when he had mild reticulation at the periphery.  Findings are suggestive of IPF as he does not have significant exposures or signs and symptoms of connective tissue disease.  I had a long discussion with him and his son in office today about the pathology of disease and trajectory.  We discussed further work-up with blood test and high-resolution CT and consideration of antifibrotic therapy. But given his age and the fact that he is very healthy with good quality of life they are  reluctant to proceed with above and want to discuss among themselves.  I fully support this decision as benefits to therapy at his age are minimal.  Plan/Recommendations: Return to clinic in 2 months  Marshell Garfinkel MD [facility] 07/10/2021, 5:05 PM  CC: Ann Held, *

## 2021-07-22 ENCOUNTER — Other Ambulatory Visit: Payer: Self-pay | Admitting: Diagnostic Neuroimaging

## 2021-07-27 ENCOUNTER — Other Ambulatory Visit: Payer: Self-pay | Admitting: Physician Assistant

## 2021-08-04 DIAGNOSIS — J849 Interstitial pulmonary disease, unspecified: Secondary | ICD-10-CM | POA: Diagnosis not present

## 2021-08-04 DIAGNOSIS — J479 Bronchiectasis, uncomplicated: Secondary | ICD-10-CM | POA: Diagnosis not present

## 2021-08-06 DIAGNOSIS — M65351 Trigger finger, right little finger: Secondary | ICD-10-CM | POA: Diagnosis not present

## 2021-08-06 DIAGNOSIS — M65352 Trigger finger, left little finger: Secondary | ICD-10-CM | POA: Diagnosis not present

## 2021-09-01 DIAGNOSIS — J849 Interstitial pulmonary disease, unspecified: Secondary | ICD-10-CM | POA: Diagnosis not present

## 2021-09-01 DIAGNOSIS — J479 Bronchiectasis, uncomplicated: Secondary | ICD-10-CM | POA: Diagnosis not present

## 2021-09-03 ENCOUNTER — Encounter: Payer: Self-pay | Admitting: Family Medicine

## 2021-09-03 ENCOUNTER — Ambulatory Visit (INDEPENDENT_AMBULATORY_CARE_PROVIDER_SITE_OTHER): Payer: Medicare Other | Admitting: Family Medicine

## 2021-09-03 VITALS — BP 118/90 | HR 66 | Temp 97.4°F | Resp 20 | Ht 72.0 in | Wt 198.8 lb

## 2021-09-03 DIAGNOSIS — R220 Localized swelling, mass and lump, head: Secondary | ICD-10-CM | POA: Diagnosis not present

## 2021-09-03 NOTE — Progress Notes (Addendum)
? ?Subjective:  ? ?By signing my name below, I, Shehryar Baig, attest that this documentation has been prepared under the direction and in the presence of Dr. Roma Schanz, DO. 09/03/2021 ? ? ? Patient ID: Kevin Glass, male    DOB: 1923/02/04, 88 y.o.   MRN: [id number] ? ?Chief Complaint  ?Patient presents with  ? Lump  ?  Pt noticed bump yesterday. Bump is in front of the right ear. Pt states lump was hard. Pt reports no pain or discharge  ? ? ?HPI ?Patient is in today for a office visit.  ? ?He reports a hard lump in front of his right ear. It is non-painful and does not have discharge. He denies having any fever.  ?He states lump was rock hard yesterday == today its not as hard  ? ?His oxygen levels were low during this visit. He is typically on supplemental oxygen at home but does not bring it with him when traveling.  ? ? ?Past Medical History:  ?Diagnosis Date  ? Acid reflux   ? ARTHRITIS 08/26/2007  ? Qualifier: Diagnosis of  By: Ronnald Ramp RN, [place], [place]    ? Balance problem 06/13/2019  ? BPH (benign prostatic hyperplasia) 02/23/2007  ? Qualifier: Diagnosis of  By: Heath Lark    ? Colonic polyp   ? COLONIC POLYPS 05/04/2007  ? Qualifier: Diagnosis of  By: Ronnald Ramp RN, [place], [place]    ? Constipation 09/18/2014  ? DIVERTICULOSIS, COLON 08/26/2007  ? Qualifier: Diagnosis of  By: Ronnald Ramp RN, [place], [place]    ? Dizziness and giddiness 12/18/2009  ? Qualifier: Diagnosis of  By: Larose Kells MD, [place] Dyspepsia 04/09/2016  ? Dyspnea on exertion 03/08/2021  ? Finger pain, right 06/13/2019  ? Gastroesophageal reflux disease 12/26/2019  ? Height loss 06/13/2019  ? HEMORRHOIDS 08/26/2007  ? Qualifier: Diagnosis of  By: Ronnald Ramp RN, [place], [place]    ? IMPAIRED GLUCOSE TOLERANCE 02/23/2007  ? Qualifier: Diagnosis of  By: Heath Lark    ? Lower extremity edema 09/14/2020  ? Neck pain 06/13/2019  ? NONSPECIFIC ABNORMAL ELECTROCARDIOGRAM 06/27/2009  ? Qualifier: Diagnosis of  By: Larose Kells MD, [place] Obesity (BMI 30-39.9) 05/27/2013  ?  Post-nasal drainage 10/28/2018  ? Last Assessment & Plan:  Formatting of this note might be different from the original. Concern over rhinorrhea. Significant anterior rhinorrhea.  Daily.  Ipratropium helped but when he stopped it his symptoms recurred. EXAMINATION shows no intranasal polyps or purulence. PLAN: We discussed vasomotor rhinitis in detail we never cure it.  If he wants to control the symptoms he has to use the medicat  ? Pressure injury of right buttock, stage 1 12/26/2019  ? Preventative health care 04/05/2014  ? Wellness labs, vaccines, procedures are up to date will only order future lipid panel. Pt encouraged to get hearing aid through [place].  Also flu vaccine given today.  ? Right knee pain 09/21/2014  ? Tendinopathy of rotator cuff 01/02/2015  ? Thoracic spine pain 06/13/2019  ? Urinary frequency 12/26/2019  ? Vertical diplopia   ? Viral URI 11/21/2010  ? ? ?Past Surgical History:  ?Procedure Laterality Date  ? HEMORRHOID SURGERY    ? HERNIA REPAIR  1994  ? x 2  ? TOTAL KNEE ARTHROPLASTY  2003  ? Dr Maureen Ralphs  ? ? ?Family History  ?Problem Relation Age of Onset  ? Cancer Mother   ? Parkinson's disease Father   ? Arthritis Sister   ? ? ?  Social History  ? ?Socioeconomic History  ? Marital status: Widowed  ?  Spouse name: Not on file  ? Number of children: 3  ? Years of education: 71  ? Highest education level: Not on file  ?Occupational History  ? Occupation: retired  ?Tobacco Use  ? Smoking status: Former  ?  Types: Cigarettes  ?  Quit date: 06/16/1984  ?  Years since quitting: 37.2  ? Smokeless tobacco: Never  ?Vaping Use  ? Vaping Use: Never used  ?Substance and Sexual Activity  ? Alcohol use: No  ?  Alcohol/week: 0.0 standard drinks  ?  Comment: quit 1986  ? Drug use: No  ? Sexual activity: Yes  ?  Partners: Female  ?Other Topics Concern  ? Not on file  ?Social History Narrative  ? 111/1/21ives with daughter Gae Bon  ? No caffeine  ? Exercise--[employer] twice a week and walks dog  ? ?Social Determinants of Health   ? ?Financial Resource Strain: Not on file  ?Food Insecurity: Not on file  ?Transportation Needs: Not on file  ?Physical Activity: Not on file  ?Stress: Not on file  ?Social Connections: Not on file  ?Intimate Partner Violence: Not on file  ? ? ?Outpatient Medications Prior to Visit  ?Medication Sig Dispense Refill  ? Ascorbic Acid (VITAMIN C) 500 MG tablet Take 1,000 mg by mouth daily.    ? aspirin 81 MG tablet Take 81 mg by mouth every other day.    ? azelastine (ASTELIN) 0.1 % nasal spray Place 2 sprays into both nostrils 2 (two) times daily. 30 mL 6  ? Cholecalciferol (VITAMIN D) 2000 UNITS CAPS Take 1 capsule by mouth daily.    ? ipratropium (ATROVENT) 0.06 % nasal spray Place 1 spray into both nostrils as needed for allergies or rhinitis.    ? lactulose (CHRONULAC) 10 GM/15ML solution Take 30 mLs (20 g total) by mouth 2 (two) times daily as needed for moderate constipation. 1800 mL 1  ? Omega-3 Fatty Acids (FISH OIL) 1200 MG CAPS Take 1,200 mg by mouth daily.    ? omeprazole (PRILOSEC) 20 MG capsule TAKE 1 CAPSULE BY MOUTH DAILY  (PLEASE CALL OFFICE TO SCHEDULE  FOLLOW UP) 90 capsule 0  ? pyridostigmine (MESTINON) 60 MG tablet TAKE 1/2 TO 1 TABLET BY  MOUTH 3 TIMES DAILY 100 tablet 0  ? Saw Palmetto 500 MG CAPS Take 1 capsule by mouth every other day.    ? solifenacin (VESICARE) 5 MG tablet TAKE 1 TABLET BY MOUTH  DAILY 90 tablet 1  ? ?No facility-administered medications prior to visit.  ? ? ?Allergies  ?Allergen Reactions  ? Penicillins Rash  ?  White blister on the skin ?  ? ? ?Review of Systems  ?Constitutional:  Negative for fever and malaise/fatigue.  ?HENT:  Negative for congestion.   ?Eyes:  Negative for blurred vision.  ?Respiratory:  Negative for shortness of breath.   ?Cardiovascular:  Negative for chest pain, palpitations and leg swelling.  ?Gastrointestinal:  Negative for abdominal pain, blood in stool and nausea.  ?Genitourinary:  Negative for dysuria and frequency.  ?Musculoskeletal:  Negative  for falls.  ?Skin:  Negative for rash.  ?     (+)mass in front of right ear  ?Neurological:  Negative for dizziness, loss of consciousness and headaches.  ?Endo/Heme/Allergies:  Negative for environmental allergies.  ?Psychiatric/Behavioral:  Negative for depression. The patient is not nervous/anxious.   ? ?   ?Objective:  ?  ?Physical Exam ?  Vitals and nursing note reviewed.  ?Constitutional:   ?   General: He is not in acute distress. ?   Appearance: Normal appearance. He is not ill-appearing.  ?HENT:  ?   Head: Normocephalic and atraumatic.  ?   Right Ear: External ear normal.  ?   Left Ear: External ear normal.  ?Eyes:  ?   Extraocular Movements: Extraocular movements intact.  ?   Pupils: Pupils are equal, round, and reactive to light.  ?Cardiovascular:  ?   Rate and Rhythm: Normal rate and regular rhythm.  ?   Heart sounds: Normal heart sounds. No murmur heard. ?  No gallop.  ?Pulmonary:  ?   Effort: Pulmonary effort is normal. No respiratory distress.  ?   Breath sounds: Normal breath sounds. No wheezing or rales.  ?Skin: ?   General: Skin is warm and dry.  ?   Findings: Lesion present.  ?   Comments: Hard, non-mobile, non-erythematous lump on right side of face in front of ear without discharge.   Non tender to touch  ?Neurological:  ?   Mental Status: He is alert and oriented to person, place, and time.  ?Psychiatric:     ?   Judgment: Judgment normal.  ? ? ?BP 118/90 (BP Location: Right Arm, Patient Position: Sitting, Cuff Size: Normal)   Pulse 66   Temp (!) 97.4 ?F (36.3 ?C) (Oral)   Resp 20   Ht 6' (1.829 m)   Wt 198 lb 12.8 oz (90.2 kg)   SpO2 (!) 78%   BMI 26.96 kg/m?  ?Wt Readings from Last 3 Encounters:  ?09/03/21 198 lb 12.8 oz (90.2 kg)  ?07/09/21 198 lb 12.8 oz (90.2 kg)  ?06/20/21 198 lb 9.6 oz (90.1 kg)  ? ? ?Diabetic Foot Exam - Simple   ?No data filed ?  ? ?Lab Results  ?Component Value Date  ? WBC 5.1 03/05/2021  ? HGB 14.8 03/05/2021  ? HCT 44.0 03/05/2021  ? PLT 135.0 (L) 03/05/2021  ?  GLUCOSE 70 03/05/2021  ? CHOL 145 03/05/2021  ? TRIG 179.0 (H) 03/05/2021  ? HDL 41.30 03/05/2021  ? [place] 68 03/05/2021  ? ALT 10 03/05/2021  ? AST 15 03/05/2021  ? NA 141 03/05/2021  ? K 4.8 03/05/2021  ? CL 102

## 2021-09-04 ENCOUNTER — Other Ambulatory Visit (INDEPENDENT_AMBULATORY_CARE_PROVIDER_SITE_OTHER): Payer: Medicare Other

## 2021-09-04 DIAGNOSIS — R220 Localized swelling, mass and lump, head: Secondary | ICD-10-CM | POA: Diagnosis not present

## 2021-09-04 LAB — CBC WITH DIFFERENTIAL/PLATELET
Basophils Absolute: 0 10*3/uL (ref 0.0–0.1)
Basophils Relative: 0.5 % (ref 0.0–3.0)
Eosinophils Absolute: 0.2 10*3/uL (ref 0.0–0.7)
Eosinophils Relative: 3.8 % (ref 0.0–5.0)
HCT: 46.5 % (ref 39.0–52.0)
Hemoglobin: 15.5 g/dL (ref 13.0–17.0)
Lymphocytes Relative: 38.2 % (ref 12.0–46.0)
Lymphs Abs: 1.9 10*3/uL (ref 0.7–4.0)
MCHC: 33.3 g/dL (ref 30.0–36.0)
MCV: 100.4 fl — ABNORMAL HIGH (ref 78.0–100.0)
Monocytes Absolute: 0.4 10*3/uL (ref 0.1–1.0)
Monocytes Relative: 7.6 % (ref 3.0–12.0)
Neutro Abs: 2.5 10*3/uL (ref 1.4–7.7)
Neutrophils Relative %: 49.9 % (ref 43.0–77.0)
Platelets: 141 10*3/uL — ABNORMAL LOW (ref 150.0–400.0)
RBC: 4.63 Mil/uL (ref 4.22–5.81)
RDW: 13.2 % (ref 11.5–15.5)
WBC: 4.9 10*3/uL (ref 4.0–10.5)

## 2021-09-05 ENCOUNTER — Other Ambulatory Visit: Payer: Self-pay | Admitting: Family Medicine

## 2021-09-05 ENCOUNTER — Other Ambulatory Visit: Payer: Self-pay

## 2021-09-05 ENCOUNTER — Ambulatory Visit (HOSPITAL_BASED_OUTPATIENT_CLINIC_OR_DEPARTMENT_OTHER)
Admission: RE | Admit: 2021-09-05 | Discharge: 2021-09-05 | Disposition: A | Discharge status: Home / Self Care | Payer: Medicare Other | Source: Ambulatory Visit | Attending: Family Medicine | Admitting: Family Medicine

## 2021-09-05 DIAGNOSIS — R221 Localized swelling, mass and lump, neck: Secondary | ICD-10-CM | POA: Diagnosis not present

## 2021-09-05 DIAGNOSIS — R220 Localized swelling, mass and lump, head: Secondary | ICD-10-CM | POA: Diagnosis not present

## 2021-09-06 ENCOUNTER — Encounter: Payer: Self-pay | Admitting: Family Medicine

## 2021-09-06 NOTE — Telephone Encounter (Signed)
Spoke with patient's daughter by phone. She advised understanding. Referral placed ?

## 2021-09-07 ENCOUNTER — Other Ambulatory Visit: Payer: Self-pay | Admitting: Diagnostic Neuroimaging

## 2021-09-30 ENCOUNTER — Encounter: Payer: Self-pay | Admitting: Pulmonary Disease

## 2021-10-02 DIAGNOSIS — J849 Interstitial pulmonary disease, unspecified: Secondary | ICD-10-CM | POA: Diagnosis not present

## 2021-10-02 DIAGNOSIS — J479 Bronchiectasis, uncomplicated: Secondary | ICD-10-CM | POA: Diagnosis not present

## 2021-10-04 ENCOUNTER — Other Ambulatory Visit: Payer: Self-pay | Admitting: Diagnostic Neuroimaging

## 2021-10-09 ENCOUNTER — Telehealth: Payer: Self-pay | Admitting: Family Medicine

## 2021-10-09 NOTE — Telephone Encounter (Signed)
Pt's daughter dropped off handicap form and [facility] housebound examination form (4 pages total). Daughter would like a call on cell when ready, [phone]. Paperwork placed in provider box up front.  ?

## 2021-10-10 DIAGNOSIS — R0982 Postnasal drip: Secondary | ICD-10-CM | POA: Diagnosis not present

## 2021-10-10 DIAGNOSIS — H903 Sensorineural hearing loss, bilateral: Secondary | ICD-10-CM | POA: Diagnosis not present

## 2021-10-10 DIAGNOSIS — K118 Other diseases of salivary glands: Secondary | ICD-10-CM | POA: Diagnosis not present

## 2021-10-10 NOTE — Telephone Encounter (Signed)
Received

## 2021-10-15 ENCOUNTER — Other Ambulatory Visit: Payer: Self-pay | Admitting: Diagnostic Neuroimaging

## 2021-10-16 ENCOUNTER — Other Ambulatory Visit: Payer: Self-pay | Admitting: Physician Assistant

## 2021-10-16 NOTE — Telephone Encounter (Signed)
Spoke with patient's daughter. Pt put on for virtual tomorrow to complete forms ?

## 2021-10-17 ENCOUNTER — Encounter: Payer: Medicare Other | Admitting: Family Medicine

## 2021-10-17 DIAGNOSIS — R531 Weakness: Secondary | ICD-10-CM

## 2021-10-17 NOTE — Progress Notes (Signed)
This encounter was created in error - please disregard.

## 2021-10-21 DIAGNOSIS — H2513 Age-related nuclear cataract, bilateral: Secondary | ICD-10-CM | POA: Diagnosis not present

## 2021-10-21 DIAGNOSIS — H532 Diplopia: Secondary | ICD-10-CM | POA: Diagnosis not present

## 2021-10-21 DIAGNOSIS — H52203 Unspecified astigmatism, bilateral: Secondary | ICD-10-CM | POA: Diagnosis not present

## 2021-10-21 DIAGNOSIS — H5203 Hypermetropia, bilateral: Secondary | ICD-10-CM | POA: Diagnosis not present

## 2021-10-25 ENCOUNTER — Encounter: Payer: Self-pay | Admitting: Family Medicine

## 2021-10-25 ENCOUNTER — Telehealth (INDEPENDENT_AMBULATORY_CARE_PROVIDER_SITE_OTHER): Payer: Medicare Other | Admitting: Family Medicine

## 2021-10-25 DIAGNOSIS — M129 Arthropathy, unspecified: Secondary | ICD-10-CM

## 2021-10-25 DIAGNOSIS — R2689 Other abnormalities of gait and mobility: Secondary | ICD-10-CM

## 2021-10-25 DIAGNOSIS — R413 Other amnesia: Secondary | ICD-10-CM | POA: Diagnosis not present

## 2021-10-25 NOTE — Assessment & Plan Note (Signed)
Stable  ?[facility] paperwork filled out ?

## 2021-10-25 NOTE — Assessment & Plan Note (Signed)
stable °

## 2021-10-25 NOTE — Assessment & Plan Note (Signed)
Pt walking with cane ?[facility] paperwork filled out for help in the house ?

## 2021-10-25 NOTE — Progress Notes (Signed)
? ? ?MyChart Video Visit ? ? ? ?Virtual Visit via Video Note  ? ?This visit type was conducted due to national recommendations for restrictions regarding the COVID-19 Pandemic (e.g. social distancing) in an effort to limit this patient's exposure and mitigate transmission in our community. This patient is at least at moderate risk for complications without adequate follow up. This format is felt to be most appropriate for this patient at this time. Physical exam was limited by quality of the video and audio technology used for the visit. Kevin Glass. was able to get the patient set up on a video visit. ? ?Patient location: Home Patient and provider in visit ?Provider location: Office ? ?I discussed the limitations of evaluation and management by telemedicine and the availability of in person appointments. The patient expressed understanding and agreed to proceed. ? ?Visit Date: 10/25/2021 ? ?Today's healthcare provider: Ann Held, DO  ? ?Subjective:  ? ? Patient ID: Kevin Glass, male    DOB: 11-May-1923, 88 y.o.   MRN: [id number] ? ?Chief Complaint  ?Patient presents with  ? FORMS  ? ? ?HPI ?Patient is in today for a video visit. His son is speaking on his behalf. ? ?He is requesting for a form to be filled to receive help from the [place]. His son states he is still pretty sharp although he has slight memory loss, is experiencing loss of balance and often walks with a cane around the house. He uses the oxygen tank at his house. He will like a caregiver to help out with taking care of him. Adds that his long-term memory is not too good. ? ? ? ?Past Medical History:  ?Diagnosis Date  ? Acid reflux   ? ARTHRITIS 08/26/2007  ? Qualifier: Diagnosis of  By: Ronnald Ramp RN, [place], [place]    ? Balance problem 06/13/2019  ? BPH (benign prostatic hyperplasia) 02/23/2007  ? Qualifier: Diagnosis of  By: Heath Lark    ? Colonic polyp   ? COLONIC POLYPS 05/04/2007  ? Qualifier: Diagnosis of  By: Ronnald Ramp RN, [place], [place]    ?  Constipation 09/18/2014  ? DIVERTICULOSIS, COLON 08/26/2007  ? Qualifier: Diagnosis of  By: Ronnald Ramp RN, [place], [place]    ? Dizziness and giddiness 12/18/2009  ? Qualifier: Diagnosis of  By: Larose Kells MD, [place] Dyspepsia 04/09/2016  ? Dyspnea on exertion 03/08/2021  ? Finger pain, right 06/13/2019  ? Gastroesophageal reflux disease 12/26/2019  ? Height loss 06/13/2019  ? HEMORRHOIDS 08/26/2007  ? Qualifier: Diagnosis of  By: Ronnald Ramp RN, [place], [place]    ? IMPAIRED GLUCOSE TOLERANCE 02/23/2007  ? Qualifier: Diagnosis of  By: Heath Lark    ? Lower extremity edema 09/14/2020  ? Neck pain 06/13/2019  ? NONSPECIFIC ABNORMAL ELECTROCARDIOGRAM 06/27/2009  ? Qualifier: Diagnosis of  By: Larose Kells MD, [place] Obesity (BMI 30-39.9) 05/27/2013  ? Post-nasal drainage 10/28/2018  ? Last Assessment & Plan:  Formatting of this note might be different from the original. Concern over rhinorrhea. Significant anterior rhinorrhea.  Daily.  Ipratropium helped but when he stopped it his symptoms recurred. EXAMINATION shows no intranasal polyps or purulence. PLAN: We discussed vasomotor rhinitis in detail we never cure it.  If he wants to control the symptoms he has to use the medicat  ? Pressure injury of right buttock, stage 1 12/26/2019  ? Preventative health care 04/05/2014  ? Wellness labs, vaccines, procedures are up to date will only order future lipid  panel. Pt encouraged to get hearing aid through [place].  Also flu vaccine given today.  ? Right knee pain 09/21/2014  ? Tendinopathy of rotator cuff 01/02/2015  ? Thoracic spine pain 06/13/2019  ? Urinary frequency 12/26/2019  ? Vertical diplopia   ? Viral URI 11/21/2010  ? ? ?Past Surgical History:  ?Procedure Laterality Date  ? HEMORRHOID SURGERY    ? HERNIA REPAIR  1994  ? x 2  ? TOTAL KNEE ARTHROPLASTY  2003  ? Dr Maureen Ralphs  ? ? ?Family History  ?Problem Relation Age of Onset  ? Cancer Mother   ? Parkinson's disease Father   ? Arthritis Sister   ? ? ?Social History  ? ?Socioeconomic History  ? Marital status:  Widowed  ?  Spouse name: Not on file  ? Number of children: 3  ? Years of education: 37  ? Highest education level: Not on file  ?Occupational History  ? Occupation: retired  ?Tobacco Use  ? Smoking status: Former  ?  Types: Cigarettes  ?  Quit date: 06/16/1984  ?  Years since quitting: 37.3  ? Smokeless tobacco: Never  ?Vaping Use  ? Vaping Use: Never used  ?Substance and Sexual Activity  ? Alcohol use: No  ?  Alcohol/week: 0.0 standard drinks  ?  Comment: quit 1986  ? Drug use: No  ? Sexual activity: Yes  ?  Partners: Female  ?Other Topics Concern  ? Not on file  ?Social History Narrative  ? 111/1/21ives with daughter Gae Bon  ? No caffeine  ? Exercise--[employer] twice a week and walks dog  ? ?Social Determinants of Health  ? ?Financial Resource Strain: Not on file  ?Food Insecurity: Not on file  ?Transportation Needs: Not on file  ?Physical Activity: Not on file  ?Stress: Not on file  ?Social Connections: Not on file  ?Intimate Partner Violence: Not on file  ? ? ?Outpatient Medications Prior to Visit  ?Medication Sig Dispense Refill  ? Ascorbic Acid (VITAMIN C) 500 MG tablet Take 1,000 mg by mouth daily.    ? aspirin 81 MG tablet Take 81 mg by mouth every other day.    ? azelastine (ASTELIN) 0.1 % nasal spray Place 2 sprays into both nostrils 2 (two) times daily. 30 mL 6  ? Cholecalciferol (VITAMIN D) 2000 UNITS CAPS Take 1 capsule by mouth daily.    ? ipratropium (ATROVENT) 0.06 % nasal spray Place 1 spray into both nostrils as needed for allergies or rhinitis.    ? lactulose (CHRONULAC) 10 GM/15ML solution Take 30 mLs (20 g total) by mouth 2 (two) times daily as needed for moderate constipation. 1800 mL 1  ? Omega-3 Fatty Acids (FISH OIL) 1200 MG CAPS Take 1,200 mg by mouth daily.    ? omeprazole (PRILOSEC) 20 MG capsule TAKE 1 CAPSULE BY MOUTH DAILY  (PLEASE CALL OFFICE TO SCHEDULE  FOLLOW UP) 90 capsule 0  ? pyridostigmine (MESTINON) 60 MG tablet TAKE 1/2 TO 1 TABLET BY MOUTH 3  TIMES DAILY 100 tablet 0  ? Saw  Palmetto 500 MG CAPS Take 1 capsule by mouth every other day.    ? solifenacin (VESICARE) 5 MG tablet TAKE 1 TABLET BY MOUTH  DAILY 90 tablet 1  ? ?No facility-administered medications prior to visit.  ? ? ?Allergies  ?Allergen Reactions  ? Penicillins Rash  ?  White blister on the skin ?  ? ? ?Review of Systems  ?Musculoskeletal:  Positive for back pain.  ?Neurological:  Positive  for weakness.  ?     (+) loss of balance  ?Psychiatric/Behavioral:  Positive for memory loss.   ? ?   ?Objective:  ?  ?Physical Exam ? ?There were no vitals taken for this visit. ?Wt Readings from Last 3 Encounters:  ?09/03/21 198 lb 12.8 oz (90.2 kg)  ?07/09/21 198 lb 12.8 oz (90.2 kg)  ?06/20/21 198 lb 9.6 oz (90.1 kg)  ? ? ?Diabetic Foot Exam - Simple   ?No data filed ?  ? ?Lab Results  ?Component Value Date  ? WBC 4.9 09/04/2021  ? HGB 15.5 09/04/2021  ? HCT 46.5 09/04/2021  ? PLT 141.0 (L) 09/04/2021  ? GLUCOSE 70 03/05/2021  ? CHOL 145 03/05/2021  ? TRIG 179.0 (H) 03/05/2021  ? HDL 41.30 03/05/2021  ? [place] 68 03/05/2021  ? ALT 10 03/05/2021  ? AST 15 03/05/2021  ? NA 141 03/05/2021  ? K 4.8 03/05/2021  ? CL 102 03/05/2021  ? CREATININE 0.83 03/05/2021  ? BUN 20 03/05/2021  ? CO2 33 (H) 03/05/2021  ? TSH 4.240 04/16/2020  ? PSA 1.08 09/14/2020  ? HGBA1C 5.7 12/11/2017  ? ? ?Lab Results  ?Component Value Date  ? TSH 4.240 04/16/2020  ? ?Lab Results  ?Component Value Date  ? WBC 4.9 09/04/2021  ? HGB 15.5 09/04/2021  ? HCT 46.5 09/04/2021  ? MCV 100.4 (H) 09/04/2021  ? PLT 141.0 (L) 09/04/2021  ? ?Lab Results  ?Component Value Date  ? NA 141 03/05/2021  ? K 4.8 03/05/2021  ? CO2 33 (H) 03/05/2021  ? GLUCOSE 70 03/05/2021  ? BUN 20 03/05/2021  ? CREATININE 0.83 03/05/2021  ? BILITOT 0.7 03/05/2021  ? ALKPHOS 61 03/05/2021  ? AST 15 03/05/2021  ? ALT 10 03/05/2021  ? PROT 6.7 03/05/2021  ? ALBUMIN 3.9 03/05/2021  ? CALCIUM 9.4 03/05/2021  ? ANIONGAP 8 06/07/2020  ? GFR 73.91 03/05/2021  ? ?Lab Results  ?Component Value Date  ? CHOL 145  03/05/2021  ? ?Lab Results  ?Component Value Date  ? HDL 41.30 03/05/2021  ? ?Lab Results  ?Component Value Date  ? [place] 68 03/05/2021  ? ?Lab Results  ?Component Value Date  ? TRIG 179.0 (H) 03/05/2021  ? ?Lab

## 2021-10-28 ENCOUNTER — Telehealth: Payer: Self-pay | Admitting: Diagnostic Neuroimaging

## 2021-10-28 MED ORDER — PYRIDOSTIGMINE BROMIDE 60 MG PO TABS: ORAL_TABLET | ORAL | 5 refills | Status: DC

## 2021-10-28 NOTE — Telephone Encounter (Signed)
Pt's request refill for pyridostigmine (MESTINON) 60 MG tablet at  ?[facility] [address] ?Phone: [phone] ? ?Scheduled appt with Dr. Leta Baptist on 03/31/22 at 4:00 pm ? ?

## 2021-11-01 DIAGNOSIS — J849 Interstitial pulmonary disease, unspecified: Secondary | ICD-10-CM | POA: Diagnosis not present

## 2021-11-01 DIAGNOSIS — J479 Bronchiectasis, uncomplicated: Secondary | ICD-10-CM | POA: Diagnosis not present

## 2021-11-25 ENCOUNTER — Other Ambulatory Visit: Payer: Self-pay | Admitting: Family Medicine

## 2021-11-26 ENCOUNTER — Other Ambulatory Visit: Payer: Self-pay | Admitting: Family Medicine

## 2021-12-02 DIAGNOSIS — J479 Bronchiectasis, uncomplicated: Secondary | ICD-10-CM | POA: Diagnosis not present

## 2021-12-02 DIAGNOSIS — J849 Interstitial pulmonary disease, unspecified: Secondary | ICD-10-CM | POA: Diagnosis not present

## 2022-01-01 DIAGNOSIS — J479 Bronchiectasis, uncomplicated: Secondary | ICD-10-CM | POA: Diagnosis not present

## 2022-01-01 DIAGNOSIS — J849 Interstitial pulmonary disease, unspecified: Secondary | ICD-10-CM | POA: Diagnosis not present

## 2022-02-01 DIAGNOSIS — J849 Interstitial pulmonary disease, unspecified: Secondary | ICD-10-CM | POA: Diagnosis not present

## 2022-02-01 DIAGNOSIS — J479 Bronchiectasis, uncomplicated: Secondary | ICD-10-CM | POA: Diagnosis not present

## 2022-02-21 ENCOUNTER — Other Ambulatory Visit: Payer: Self-pay | Admitting: Family Medicine

## 2022-03-04 DIAGNOSIS — J479 Bronchiectasis, uncomplicated: Secondary | ICD-10-CM | POA: Diagnosis not present

## 2022-03-04 DIAGNOSIS — J849 Interstitial pulmonary disease, unspecified: Secondary | ICD-10-CM | POA: Diagnosis not present

## 2022-03-31 ENCOUNTER — Ambulatory Visit: Payer: Medicare Other | Admitting: Diagnostic Neuroimaging

## 2022-03-31 ENCOUNTER — Other Ambulatory Visit: Payer: Self-pay

## 2022-03-31 ENCOUNTER — Telehealth: Payer: Self-pay | Admitting: Diagnostic Neuroimaging

## 2022-03-31 MED ORDER — PYRIDOSTIGMINE BROMIDE 60 MG PO TABS: ORAL_TABLET | ORAL | 0 refills | Status: DC

## 2022-03-31 NOTE — Telephone Encounter (Signed)
Rx sent until pt completed appt

## 2022-03-31 NOTE — Telephone Encounter (Signed)
Patient scheduled sooner with NP

## 2022-03-31 NOTE — Telephone Encounter (Signed)
Patient had to reschedule Dr.P was behind hour Pt is Kevin Glass. He may need a refill to get him trough to the 11/7 visit, Can you possible give him 2 weeks worth of Mestinon to hold him over? [facility] [address] [facility] it's called in. Thanks

## 2022-04-01 NOTE — Progress Notes (Unsigned)
[facility]  PATIENT: Kevin Glass DOB: 06/12/1923  REFERRING CLINICIAN: Ann Held, * HISTORY FROM: patient and daughter  REASON FOR VISIT: Myasthenia gravis   HISTORICAL  CHIEF COMPLAINT:  No chief complaint on file.   HISTORY OF PRESENT ILLNESS:   Consult visit 04/16/2020 Dr. Leta Baptist: 88 year old male with double vision.  Symptoms started in June 2021 with sudden onset double vision.  Patient recalls horizontal double vision but patient's daughter and referring notes mention diagonal or vertical double vision.  Symptoms were present with both eyes open.  Symptoms have been fluctuating.  Patient went to eye doctor in August 2021 for evaluation and was fitted with prism glasses.  Since that time symptoms are overall improved but continue to fluctuate.  Today patient does not have a double vision with his glasses on or off.  No headaches, numbness, weakness, slurred speech.   Update 04/02/2022 JM: Patient returns for follow-up visit after prior visit with Dr. Leta Baptist almost 2 years ago.  After prior visit, patient completed lab work and was diagnosed with myasthenia gravis and was started on Mestinon.      REVIEW OF SYSTEMS: Full 14 system review of systems performed and negative with exception of: As per HPI.  ALLERGIES: Allergies  Allergen Reactions   Penicillins Rash    White blister on the skin     HOME MEDICATIONS: Outpatient Medications Prior to Visit  Medication Sig Dispense Refill   Ascorbic Acid (VITAMIN C) 500 MG tablet Take 1,000 mg by mouth daily.     aspirin 81 MG tablet Take 81 mg by mouth every other day.     azelastine (ASTELIN) 0.1 % nasal spray Place 2 sprays into both nostrils 2 (two) times daily. 30 mL 6   Cholecalciferol (VITAMIN D) 2000 UNITS CAPS Take 1 capsule by mouth daily.     ipratropium (ATROVENT) 0.06 % nasal spray Place 1 spray into both nostrils as needed for allergies or rhinitis.     lactulose  (CHRONULAC) 10 GM/15ML solution Take 30 mLs (20 g total) by mouth 2 (two) times daily as needed for moderate constipation. 1800 mL 1   Omega-3 Fatty Acids (FISH OIL) 1200 MG CAPS Take 1,200 mg by mouth daily.     omeprazole (PRILOSEC) 20 MG capsule TAKE 1 CAPSULE BY MOUTH DAILY  (PLEASE CALL OFFICE TO SCHEDULE  FOLLOW UP) 90 capsule 0   pyridostigmine (MESTINON) 60 MG tablet TAKE 1/2 TO 1 TABLET BY MOUTH 3  TIMES DAILY 100 tablet 0   Saw Palmetto 500 MG CAPS Take 1 capsule by mouth every other day.     solifenacin (VESICARE) 5 MG tablet Take 1 tablet (5 mg total) by mouth daily. 90 tablet 0   No facility-administered medications prior to visit.    PAST MEDICAL HISTORY: Past Medical History:  Diagnosis Date   Acid reflux    ARTHRITIS 08/26/2007   Qualifier: Diagnosis of  By: Ronnald Ramp RN, CGRN, [place]     Balance problem 06/13/2019   BPH (benign prostatic hyperplasia) 02/23/2007   Qualifier: Diagnosis of  By: Heath Lark     Colonic polyp    COLONIC POLYPS 05/04/2007   Qualifier: Diagnosis of  By: Ronnald Ramp RN, CGRN, [place]     Constipation 09/18/2014   DIVERTICULOSIS, COLON 08/26/2007   Qualifier: Diagnosis of  By: Ronnald Ramp RN, CGRN, [place]     Dizziness and giddiness 12/18/2009   Qualifier: Diagnosis of  By: Larose Kells MD, [place]  04/09/2016   Dyspnea on exertion 03/08/2021   Finger pain, right 06/13/2019   Gastroesophageal reflux disease 12/26/2019   Height loss 06/13/2019   HEMORRHOIDS 08/26/2007   Qualifier: Diagnosis of  By: Ronnald Ramp RN, CGRN, [place]     IMPAIRED GLUCOSE TOLERANCE 02/23/2007   Qualifier: Diagnosis of  By: Heath Lark     Lower extremity edema 09/14/2020   Neck pain 06/13/2019   NONSPECIFIC ABNORMAL ELECTROCARDIOGRAM 06/27/2009   Qualifier: Diagnosis of  By: Larose Kells MD, [place]    Obesity (BMI 30-39.9) 05/27/2013   Post-nasal drainage 10/28/2018   Last Assessment & Plan:  Formatting of this note might be different from the original. Concern over rhinorrhea. Significant anterior  rhinorrhea.  Daily.  Ipratropium helped but when he stopped it his symptoms recurred. EXAMINATION shows no intranasal polyps or purulence. PLAN: We discussed vasomotor rhinitis in detail we never cure it.  If he wants to control the symptoms he has to use the medicat   Pressure injury of right buttock, stage 1 12/26/2019   Preventative health care 04/05/2014   Wellness labs, vaccines, procedures are up to date will only order future lipid panel. Pt encouraged to get hearing aid through [place].  Also flu vaccine given today.   Right knee pain 09/21/2014   Tendinopathy of rotator cuff 01/02/2015   Thoracic spine pain 06/13/2019   Urinary frequency 12/26/2019   Vertical diplopia    Viral URI 11/21/2010    PAST SURGICAL HISTORY: Past Surgical History:  Procedure Laterality Date   HEMORRHOID SURGERY     HERNIA REPAIR  1994   x 2   TOTAL KNEE ARTHROPLASTY  2003   Dr Maureen Ralphs    FAMILY HISTORY: Family History  Problem Relation Age of Onset   Cancer Mother    Parkinson's disease Father    Arthritis Sister     SOCIAL HISTORY: Social History   Socioeconomic History   Marital status: Widowed    Spouse name: Not on file   Number of children: 3   Years of education: 36   Highest education level: Not on file  Occupational History   Occupation: retired  Tobacco Use   Smoking status: Former    Types: Cigarettes    Quit date: 06/16/1984    Years since quitting: 37.8   Smokeless tobacco: Never  Vaping Use   Vaping Use: Never used  Substance and Sexual Activity   Alcohol use: No    Alcohol/week: 0.0 standard drinks of alcohol    Comment: quit 1986   Drug use: No   Sexual activity: Yes    Partners: Female  Other Topics Concern   Not on file  Social History Narrative   111/1/21ives with daughter Gae Bon   No caffeine   Exercise--[employer] twice a week and walks dog   Social Determinants of Health   Financial Resource Strain: Low Risk  (12/26/2019)   Overall Financial Resource Strain  (CARDIA)    Difficulty of Paying Living Expenses: Not hard at all  Food Insecurity: No Food Insecurity (12/26/2019)   Hunger Vital Sign    Worried About Running Out of Food in the Last Year: Never true    [place] in the Last Year: Never true  Transportation Needs: No Transportation Needs (12/26/2019)   PRAPARE - Hydrologist (Medical): No    Lack of Transportation (Non-Medical): No  Physical Activity: Not on file  Stress: Not on file  Social Connections: Not on file  Intimate Partner Violence: Not on file     PHYSICAL EXAM  GENERAL EXAM/CONSTITUTIONAL: Vitals:  There were no vitals filed for this visit.  There is no height or weight on file to calculate BMI. Wt Readings from Last 3 Encounters:  09/03/21 198 lb 12.8 oz (90.2 kg)  07/09/21 198 lb 12.8 oz (90.2 kg)  06/20/21 198 lb 9.6 oz (90.1 kg)   Patient is in no distress; well developed, nourished and groomed; neck is supple  CARDIOVASCULAR: Examination of carotid arteries is normal; no carotid bruits Regular rate and rhythm, no murmurs Examination of peripheral vascular system by observation and palpation is normal  EYES: Ophthalmoscopic exam of optic discs and posterior segments is normal; no papilledema or hemorrhages No results found.  MUSCULOSKELETAL: Gait, strength, tone, movements noted in Neurologic exam below  NEUROLOGIC: MENTAL STATUS:      No data to display         awake, alert, oriented to person, place and time recent and remote memory intact normal attention and concentration language fluent, comprehension intact, naming intact fund of knowledge appropriate  CRANIAL NERVE:  2nd - no papilledema on fundoscopic exam 2nd, 3rd, 4th, 6th - pupils equal and reactive to light, visual fields full to confrontation, extraocular muscles intact, no nystagmus; NO DOUBLE VISION (WITH OR WITHOUTGLASSES) 5th - facial sensation symmetric 7th - facial strength  symmetric 8th - hearing --> DECR 9th - palate elevates symmetrically, uvula midline 11th - shoulder shrug symmetric 12th - tongue protrusion midline  MOTOR:  normal bulk and tone, full strength in the BUE, BLE  SENSORY:  normal and symmetric to light touch, temperature, vibration  COORDINATION:  finger-nose-finger, fine finger movements normal  REFLEXES:  deep tendon reflexes TRACE and symmetric  GAIT/STATION:  STOOPED POSTURE; SEVERE KYPHOSIS; SHORT STEPS; UNSTEADY; SINGLE POINT CANE     DIAGNOSTIC DATA (LABS, IMAGING, TESTING) - I reviewed patient records, labs, notes, testing and imaging myself where available.  Lab Results  Component Value Date   WBC 4.9 09/04/2021   HGB 15.5 09/04/2021   HCT 46.5 09/04/2021   MCV 100.4 (H) 09/04/2021   PLT 141.0 (L) 09/04/2021      Component Value Date/Time   NA 141 03/05/2021 1123   NA 140 04/16/2020 0924   K 4.8 03/05/2021 1123   CL 102 03/05/2021 1123   CO2 33 (H) 03/05/2021 1123   GLUCOSE 70 03/05/2021 1123   BUN 20 03/05/2021 1123   BUN 15 04/16/2020 0924   CREATININE 0.83 03/05/2021 1123   CREATININE 0.89 05/27/2013 1608   CALCIUM 9.4 03/05/2021 1123   PROT 6.7 03/05/2021 1123   PROT 6.5 04/16/2020 0924   ALBUMIN 3.9 03/05/2021 1123   ALBUMIN 4.1 04/16/2020 0924   AST 15 03/05/2021 1123   ALT 10 03/05/2021 1123   ALKPHOS 61 03/05/2021 1123   BILITOT 0.7 03/05/2021 1123   BILITOT 0.6 04/16/2020 0924   GFRNONAA >60 06/07/2020 0915   GFRAA 88 04/16/2020 0924   Lab Results  Component Value Date   CHOL 145 03/05/2021   HDL 41.30 03/05/2021   LDLCALC 68 03/05/2021   TRIG 179.0 (H) 03/05/2021   CHOLHDL 4 03/05/2021   Lab Results  Component Value Date   HGBA1C 5.7 12/11/2017   Lab Results  Component Value Date   VITAMINB12 104 (L) 04/16/2020   Lab Results  Component Value Date   TSH 4.240 04/16/2020   AChR Abs with reflex 04/16/2020  - 33.10     ASSESSMENT AND  PLAN  88 y.o. year old male here  with transient intermittent double vision since June 2021.  Dx:  No diagnosis found.    PLAN:  MYASTHENIA GRAVIS TRANSIENT DOUBLE VISION (fluctuating; both eyes open; with or without prism lenses) -Continue Mestinon 60 mg 3 times daily -Due to follow-up with ophthalmologist Dr. Delman Cheadle   Vitamin B12 deficiency -B12 level 104 04/2020 -recheck today as this has not been repeated since     No orders of the defined types were placed in this encounter.  No follow-ups on file.   I spent *** minutes of face-to-face and non-face-to-face time with patient.  This included previsit chart review, lab review, study review, order entry, electronic health record documentation, patient education and discussion regarding above diagnoses and treatment plan and answered all the questions to patient's satisfaction  Frann Rider, [facility]  [facility] [address] [place], [place] [address]  Phone [phone] Fax [phone] Note: This document was prepared with digital dictation and possible smart phrase technology. Any transcriptional errors that result from this process are unintentional.

## 2022-04-02 ENCOUNTER — Encounter: Payer: Self-pay | Admitting: Adult Health

## 2022-04-02 ENCOUNTER — Ambulatory Visit: Payer: Medicare Other | Admitting: Adult Health

## 2022-04-02 VITALS — BP 137/75 | HR 54 | Ht 69.0 in | Wt 201.0 lb

## 2022-04-02 DIAGNOSIS — G7000 Myasthenia gravis without (acute) exacerbation: Secondary | ICD-10-CM | POA: Diagnosis not present

## 2022-04-02 MED ORDER — PYRIDOSTIGMINE BROMIDE 60 MG PO TABS: 60.0000 mg | ORAL_TABLET | Freq: Three times a day (TID) | ORAL | 3 refills | Status: DC

## 2022-04-03 DIAGNOSIS — J479 Bronchiectasis, uncomplicated: Secondary | ICD-10-CM | POA: Diagnosis not present

## 2022-04-03 DIAGNOSIS — J849 Interstitial pulmonary disease, unspecified: Secondary | ICD-10-CM | POA: Diagnosis not present

## 2022-04-22 ENCOUNTER — Ambulatory Visit: Payer: Medicare Other | Admitting: Diagnostic Neuroimaging

## 2022-05-04 DIAGNOSIS — J849 Interstitial pulmonary disease, unspecified: Secondary | ICD-10-CM | POA: Diagnosis not present

## 2022-05-04 DIAGNOSIS — J479 Bronchiectasis, uncomplicated: Secondary | ICD-10-CM | POA: Diagnosis not present

## 2022-05-13 ENCOUNTER — Other Ambulatory Visit: Payer: Self-pay | Admitting: Family Medicine

## 2022-05-26 ENCOUNTER — Encounter: Payer: Self-pay | Admitting: Family Medicine

## 2022-05-26 ENCOUNTER — Ambulatory Visit: Payer: Medicare Other | Admitting: Family Medicine

## 2022-05-26 VITALS — BP 120/70 | HR 33 | Temp 97.3°F | Resp 12 | Ht 69.0 in | Wt 206.8 lb

## 2022-05-26 DIAGNOSIS — L89311 Pressure ulcer of right buttock, stage 1: Secondary | ICD-10-CM | POA: Diagnosis not present

## 2022-05-26 DIAGNOSIS — L89301 Pressure ulcer of unspecified buttock, stage 1: Secondary | ICD-10-CM

## 2022-05-26 NOTE — Assessment & Plan Note (Signed)
Home health for wound care F/u as needed

## 2022-05-26 NOTE — Progress Notes (Signed)
Subjective:   By signing my name below, I, Carylon Perches, attest that this documentation has been prepared under the direction and in the presence of Ann Held DO 05/26/2022    Patient ID: Kevin Glass, male    DOB: 1923-01-01, 88 y.o.   MRN: [id number]  Chief Complaint  Patient presents with   sores on butock    2 sores near crack area    HPI Patient is in today for an office visit. He is accompanied by his son.  His son reports that the patient is experiencing sores near the crack of his buttock. He states that symptoms comes and goes. He was prescribed antibiotic creams in the past with the idea that his symptoms were due to hemorrhoids. His son is requesting that the patient receives stronger antibiotics. The patient sits for extended periods of time in his TV room.   Past Medical History:  Diagnosis Date   Acid reflux    ARTHRITIS 08/26/2007   Qualifier: Diagnosis of  By: Ronnald Ramp RN, CGRN, Sheri     Balance problem 06/13/2019   BPH (benign prostatic hyperplasia) 02/23/2007   Qualifier: Diagnosis of  By: Heath Lark     Colonic polyp    COLONIC POLYPS 05/04/2007   Qualifier: Diagnosis of  By: Ronnald Ramp RN, CGRN, Sheri     Constipation 09/18/2014   DIVERTICULOSIS, COLON 08/26/2007   Qualifier: Diagnosis of  By: Ronnald Ramp RN, CGRN, Sheri     Dizziness and giddiness 12/18/2009   Qualifier: Diagnosis of  By: Larose Kells MD, Christine    Dyspepsia 04/09/2016   Dyspnea on exertion 03/08/2021   Finger pain, right 06/13/2019   Gastroesophageal reflux disease 12/26/2019   Height loss 06/13/2019   HEMORRHOIDS 08/26/2007   Qualifier: Diagnosis of  By: Ronnald Ramp RN, CGRN, Sheri     IMPAIRED GLUCOSE TOLERANCE 02/23/2007   Qualifier: Diagnosis of  By: Heath Lark     Lower extremity edema 09/14/2020   Neck pain 06/13/2019   NONSPECIFIC ABNORMAL ELECTROCARDIOGRAM 06/27/2009   Qualifier: Diagnosis of  By: Larose Kells MD, [place]    Obesity (BMI 30-39.9) 05/27/2013   Post-nasal drainage 10/28/2018   Last  Assessment & Plan:  Formatting of this note might be different from the original. Concern over rhinorrhea. Significant anterior rhinorrhea.  Daily.  Ipratropium helped but when he stopped it his symptoms recurred. EXAMINATION shows no intranasal polyps or purulence. PLAN: We discussed vasomotor rhinitis in detail we never cure it.  If he wants to control the symptoms he has to use the medicat   Pressure injury of right buttock, stage 1 12/26/2019   Preventative health care 04/05/2014   Wellness labs, vaccines, procedures are up to date will only order future lipid panel. Pt encouraged to get hearing aid through [place].  Also flu vaccine given today.   Right knee pain 09/21/2014   Tendinopathy of rotator cuff 01/02/2015   Thoracic spine pain 06/13/2019   Urinary frequency 12/26/2019   Vertical diplopia    Viral URI 11/21/2010    Past Surgical History:  Procedure Laterality Date   HEMORRHOID SURGERY     HERNIA REPAIR  1994   x 2   TOTAL KNEE ARTHROPLASTY  2003   Dr Maureen Ralphs    Family History  Problem Relation Age of Onset   Cancer Mother    Parkinson's disease Father    Arthritis Sister     Social History   Socioeconomic History   Marital status: Widowed  Spouse name: Not on file   Number of children: 3   Years of education: 90   Highest education level: Not on file  Occupational History   Occupation: retired  Tobacco Use   Smoking status: Former    Types: Cigarettes    Quit date: 06/16/1984    Years since quitting: 37.9   Smokeless tobacco: Never  Vaping Use   Vaping Use: Never used  Substance and Sexual Activity   Alcohol use: No    Alcohol/week: 0.0 standard drinks of alcohol    Comment: quit 1986   Drug use: No   Sexual activity: Yes    Partners: Female  Other Topics Concern   Not on file  Social History Narrative   111/1/21ives with daughter Gae Bon   No caffeine   Exercise--[employer] twice a week and walks dog   Social Determinants of Health   Financial Resource  Strain: Low Risk  (12/26/2019)   Overall Financial Resource Strain (CARDIA)    Difficulty of Paying Living Expenses: Not hard at all  Food Insecurity: No Food Insecurity (12/26/2019)   Hunger Vital Sign    Worried About Running Out of Food in the Last Year: Never true    [place] in the Last Year: Never true  Transportation Needs: No Transportation Needs (12/26/2019)   PRAPARE - Hydrologist (Medical): No    Lack of Transportation (Non-Medical): No  Physical Activity: Not on file  Stress: Not on file  Social Connections: Not on file  Intimate Partner Violence: Not on file    Outpatient Medications Prior to Visit  Medication Sig Dispense Refill   Ascorbic Acid (VITAMIN C) 500 MG tablet Take 1,000 mg by mouth daily.     aspirin 81 MG tablet Take 81 mg by mouth every other day.     azelastine (ASTELIN) 0.1 % nasal spray Place 2 sprays into both nostrils 2 (two) times daily. 30 mL 6   Cholecalciferol (VITAMIN D) 2000 UNITS CAPS Take 1 capsule by mouth daily.     ipratropium (ATROVENT) 0.06 % nasal spray Place 1 spray into both nostrils as needed for allergies or rhinitis.     lactulose (CHRONULAC) 10 GM/15ML solution Take 30 mLs (20 g total) by mouth 2 (two) times daily as needed for moderate constipation. 1800 mL 1   Omega-3 Fatty Acids (FISH OIL) 1200 MG CAPS Take 1,200 mg by mouth daily.     omeprazole (PRILOSEC) 20 MG capsule TAKE 1 CAPSULE BY MOUTH DAILY  (PLEASE CALL OFFICE TO SCHEDULE  FOLLOW UP) 90 capsule 0   pyridostigmine (MESTINON) 60 MG tablet Take 1 tablet (60 mg total) by mouth 3 (three) times daily. 270 tablet 3   Saw Palmetto 500 MG CAPS Take 1 capsule by mouth every other day.     solifenacin (VESICARE) 5 MG tablet Take 1 tablet (5 mg total) by mouth daily. 90 tablet 0   No facility-administered medications prior to visit.    Allergies  Allergen Reactions   Penicillins Rash    White blister on the skin     Review of Systems   Constitutional:  Negative for fever and malaise/fatigue.  HENT:  Negative for congestion.   Eyes:  Negative for blurred vision.  Respiratory:  Negative for cough and shortness of breath.   Cardiovascular:  Negative for chest pain, palpitations and leg swelling.  Gastrointestinal:  Negative for vomiting.  Musculoskeletal:  Negative for back pain and falls.  Skin:  Positive for rash (Crack of Buttock Area).       (+) 2 Sores (Crack of Buttock)  Neurological:  Negative for loss of consciousness and headaches.      Objective:    Physical Exam Vitals and nursing note reviewed.  Constitutional:      General: He is not in acute distress.    Appearance: Normal appearance. He is not ill-appearing.  HENT:     Head: Normocephalic and atraumatic.     Right Ear: External ear normal.     Left Ear: External ear normal.  Eyes:     Extraocular Movements: Extraocular movements intact.     Pupils: Pupils are equal, round, and reactive to light.  Cardiovascular:     Rate and Rhythm: Normal rate and regular rhythm.     Heart sounds: Normal heart sounds. No murmur heard.    No gallop.  Pulmonary:     Effort: Pulmonary effort is normal. No respiratory distress.     Breath sounds: Normal breath sounds. No wheezing or rales.  Skin:    General: Skin is warm and dry.     Findings: Lesion present.     Comments: Stage 1 decubitus ulcer on the buttock b/l   Neurological:     Mental Status: He is alert and oriented to person, place, and time.  Psychiatric:        Judgment: Judgment normal.     BP 120/70 (BP Location: Left Arm, Cuff Size: Normal)   Pulse (!) 33   Temp (!) 97.3 F (36.3 C) (Oral)   Resp 12   Ht '5\' 9"'$  (1.753 m)   Wt 206 lb 12.8 oz (93.8 kg)   SpO2 97%   BMI 30.54 kg/m  Wt Readings from Last 3 Encounters:  05/26/22 206 lb 12.8 oz (93.8 kg)  04/02/22 201 lb (91.2 kg)  09/03/21 198 lb 12.8 oz (90.2 kg)    Diabetic Foot Exam - Simple   No data filed    Lab Results   Component Value Date   WBC 4.9 09/04/2021   HGB 15.5 09/04/2021   HCT 46.5 09/04/2021   PLT 141.0 (L) 09/04/2021   GLUCOSE 70 03/05/2021   CHOL 145 03/05/2021   TRIG 179.0 (H) 03/05/2021   HDL 41.30 03/05/2021   LDLCALC 68 03/05/2021   ALT 10 03/05/2021   AST 15 03/05/2021   NA 141 03/05/2021   K 4.8 03/05/2021   CL 102 03/05/2021   CREATININE 0.83 03/05/2021   BUN 20 03/05/2021   CO2 33 (H) 03/05/2021   TSH 4.240 04/16/2020   PSA 1.08 09/14/2020   HGBA1C 5.7 12/11/2017    Lab Results  Component Value Date   TSH 4.240 04/16/2020   Lab Results  Component Value Date   WBC 4.9 09/04/2021   HGB 15.5 09/04/2021   HCT 46.5 09/04/2021   MCV 100.4 (H) 09/04/2021   PLT 141.0 (L) 09/04/2021   Lab Results  Component Value Date   NA 141 03/05/2021   K 4.8 03/05/2021   CO2 33 (H) 03/05/2021   GLUCOSE 70 03/05/2021   BUN 20 03/05/2021   CREATININE 0.83 03/05/2021   BILITOT 0.7 03/05/2021   ALKPHOS 61 03/05/2021   AST 15 03/05/2021   ALT 10 03/05/2021   PROT 6.7 03/05/2021   ALBUMIN 3.9 03/05/2021   CALCIUM 9.4 03/05/2021   ANIONGAP 8 06/07/2020   GFR 73.91 03/05/2021   Lab Results  Component Value Date   CHOL 145 03/05/2021  Lab Results  Component Value Date   HDL 41.30 03/05/2021   Lab Results  Component Value Date   LDLCALC 68 03/05/2021   Lab Results  Component Value Date   TRIG 179.0 (H) 03/05/2021   Lab Results  Component Value Date   CHOLHDL 4 03/05/2021   Lab Results  Component Value Date   HGBA1C 5.7 12/11/2017       Assessment & Plan:   Problem List Items Addressed This Visit   None Visit Diagnoses     Pressure injury of buttock, stage 1, unspecified laterality    -  Primary   Relevant Orders   Ambulatory referral to [place]      No orders of the defined types were placed in this encounter.   IAnn Held, DO, personally preformed the services described in this documentation.  All medical record entries made by  the scribe were at my direction and in my presence.  I have reviewed the chart and discharge instructions (if applicable) and agree that the record reflects my personal performance and is accurate and complete. 05/26/2022   I,Amber Collins,acting as a scribe for Ann Held, DO.,have documented all relevant documentation on the behalf of Ann Held, DO,as directed by  Ann Held, DO while in the presence of Ann Held, DO.    Ann Held, DO

## 2022-05-27 NOTE — Addendum Note (Signed)
Addended by: Roma Schanz R on: 05/27/2022 05:43 PM   Modules accepted: Orders

## 2022-06-02 DIAGNOSIS — G7000 Myasthenia gravis without (acute) exacerbation: Secondary | ICD-10-CM | POA: Diagnosis not present

## 2022-06-02 DIAGNOSIS — Z9981 Dependence on supplemental oxygen: Secondary | ICD-10-CM | POA: Diagnosis not present

## 2022-06-02 DIAGNOSIS — R350 Frequency of micturition: Secondary | ICD-10-CM | POA: Diagnosis not present

## 2022-06-02 DIAGNOSIS — M129 Arthropathy, unspecified: Secondary | ICD-10-CM | POA: Diagnosis not present

## 2022-06-02 DIAGNOSIS — R413 Other amnesia: Secondary | ICD-10-CM | POA: Diagnosis not present

## 2022-06-02 DIAGNOSIS — H532 Diplopia: Secondary | ICD-10-CM | POA: Diagnosis not present

## 2022-06-02 DIAGNOSIS — J849 Interstitial pulmonary disease, unspecified: Secondary | ICD-10-CM | POA: Diagnosis not present

## 2022-06-02 DIAGNOSIS — R42 Dizziness and giddiness: Secondary | ICD-10-CM | POA: Diagnosis not present

## 2022-06-02 DIAGNOSIS — L89311 Pressure ulcer of right buttock, stage 1: Secondary | ICD-10-CM | POA: Diagnosis not present

## 2022-06-02 DIAGNOSIS — Z87891 Personal history of nicotine dependence: Secondary | ICD-10-CM | POA: Diagnosis not present

## 2022-06-02 DIAGNOSIS — K219 Gastro-esophageal reflux disease without esophagitis: Secondary | ICD-10-CM | POA: Diagnosis not present

## 2022-06-02 DIAGNOSIS — K649 Unspecified hemorrhoids: Secondary | ICD-10-CM | POA: Diagnosis not present

## 2022-06-02 DIAGNOSIS — Z96659 Presence of unspecified artificial knee joint: Secondary | ICD-10-CM | POA: Diagnosis not present

## 2022-06-02 DIAGNOSIS — K5730 Diverticulosis of large intestine without perforation or abscess without bleeding: Secondary | ICD-10-CM | POA: Diagnosis not present

## 2022-06-02 DIAGNOSIS — L89322 Pressure ulcer of left buttock, stage 2: Secondary | ICD-10-CM | POA: Diagnosis not present

## 2022-06-02 DIAGNOSIS — M25561 Pain in right knee: Secondary | ICD-10-CM | POA: Diagnosis not present

## 2022-06-02 DIAGNOSIS — K5900 Constipation, unspecified: Secondary | ICD-10-CM | POA: Diagnosis not present

## 2022-06-02 DIAGNOSIS — M546 Pain in thoracic spine: Secondary | ICD-10-CM | POA: Diagnosis not present

## 2022-06-03 ENCOUNTER — Telehealth: Payer: Self-pay | Admitting: Family Medicine

## 2022-06-03 DIAGNOSIS — K5730 Diverticulosis of large intestine without perforation or abscess without bleeding: Secondary | ICD-10-CM | POA: Diagnosis not present

## 2022-06-03 DIAGNOSIS — M25561 Pain in right knee: Secondary | ICD-10-CM | POA: Diagnosis not present

## 2022-06-03 DIAGNOSIS — Z9981 Dependence on supplemental oxygen: Secondary | ICD-10-CM | POA: Diagnosis not present

## 2022-06-03 DIAGNOSIS — M546 Pain in thoracic spine: Secondary | ICD-10-CM | POA: Diagnosis not present

## 2022-06-03 DIAGNOSIS — R42 Dizziness and giddiness: Secondary | ICD-10-CM | POA: Diagnosis not present

## 2022-06-03 DIAGNOSIS — L89311 Pressure ulcer of right buttock, stage 1: Secondary | ICD-10-CM | POA: Diagnosis not present

## 2022-06-03 DIAGNOSIS — L89322 Pressure ulcer of left buttock, stage 2: Secondary | ICD-10-CM | POA: Diagnosis not present

## 2022-06-03 DIAGNOSIS — J849 Interstitial pulmonary disease, unspecified: Secondary | ICD-10-CM | POA: Diagnosis not present

## 2022-06-03 DIAGNOSIS — J479 Bronchiectasis, uncomplicated: Secondary | ICD-10-CM | POA: Diagnosis not present

## 2022-06-03 DIAGNOSIS — M129 Arthropathy, unspecified: Secondary | ICD-10-CM | POA: Diagnosis not present

## 2022-06-03 DIAGNOSIS — K219 Gastro-esophageal reflux disease without esophagitis: Secondary | ICD-10-CM | POA: Diagnosis not present

## 2022-06-03 DIAGNOSIS — K649 Unspecified hemorrhoids: Secondary | ICD-10-CM | POA: Diagnosis not present

## 2022-06-03 DIAGNOSIS — G7000 Myasthenia gravis without (acute) exacerbation: Secondary | ICD-10-CM | POA: Diagnosis not present

## 2022-06-03 DIAGNOSIS — R350 Frequency of micturition: Secondary | ICD-10-CM | POA: Diagnosis not present

## 2022-06-03 DIAGNOSIS — K5900 Constipation, unspecified: Secondary | ICD-10-CM | POA: Diagnosis not present

## 2022-06-03 DIAGNOSIS — Z87891 Personal history of nicotine dependence: Secondary | ICD-10-CM | POA: Diagnosis not present

## 2022-06-03 DIAGNOSIS — H532 Diplopia: Secondary | ICD-10-CM | POA: Diagnosis not present

## 2022-06-03 DIAGNOSIS — Z96659 Presence of unspecified artificial knee joint: Secondary | ICD-10-CM | POA: Diagnosis not present

## 2022-06-03 DIAGNOSIS — R413 Other amnesia: Secondary | ICD-10-CM | POA: Diagnosis not present

## 2022-06-03 NOTE — Telephone Encounter (Signed)
Verbal orders given to [facility].  HH order put in at last visit.

## 2022-06-03 NOTE — Telephone Encounter (Signed)
Caller/Agency: Reece Levy [facility]) Callback Number: [phone] Requesting OT/PT/Skilled Nursing/Social Work/Speech Therapy: Skilled Nursing (Wound Care) Frequency: 1 w 9, 1 PRN

## 2022-06-04 ENCOUNTER — Telehealth: Payer: Self-pay | Admitting: Family Medicine

## 2022-06-04 NOTE — Telephone Encounter (Signed)
Left message for patient to call back and schedule Medicare Annual Wellness Visit (AWV) in office.   If not able to come in office, please offer to do virtually or by telephone.   Last AWV: 12/26/2019    Please schedule at any time with [facility]  30 minute appointment for Virtual or phone  45 minute appointment for Initial virtual/phone  Any questions, please contact me at [phone]   Thank you,   [facility]  Ambulatory Clinical Support for Care Management [place] Are. We Are. [facility] ??[phone] or ??[phone]

## 2022-06-05 ENCOUNTER — Telehealth: Payer: Self-pay | Admitting: Family Medicine

## 2022-06-05 DIAGNOSIS — R42 Dizziness and giddiness: Secondary | ICD-10-CM | POA: Diagnosis not present

## 2022-06-05 DIAGNOSIS — G7000 Myasthenia gravis without (acute) exacerbation: Secondary | ICD-10-CM | POA: Diagnosis not present

## 2022-06-05 DIAGNOSIS — K219 Gastro-esophageal reflux disease without esophagitis: Secondary | ICD-10-CM | POA: Diagnosis not present

## 2022-06-05 DIAGNOSIS — L89311 Pressure ulcer of right buttock, stage 1: Secondary | ICD-10-CM | POA: Diagnosis not present

## 2022-06-05 DIAGNOSIS — H532 Diplopia: Secondary | ICD-10-CM | POA: Diagnosis not present

## 2022-06-05 DIAGNOSIS — Z96659 Presence of unspecified artificial knee joint: Secondary | ICD-10-CM | POA: Diagnosis not present

## 2022-06-05 DIAGNOSIS — M129 Arthropathy, unspecified: Secondary | ICD-10-CM | POA: Diagnosis not present

## 2022-06-05 DIAGNOSIS — K5900 Constipation, unspecified: Secondary | ICD-10-CM | POA: Diagnosis not present

## 2022-06-05 DIAGNOSIS — K5730 Diverticulosis of large intestine without perforation or abscess without bleeding: Secondary | ICD-10-CM | POA: Diagnosis not present

## 2022-06-05 DIAGNOSIS — R350 Frequency of micturition: Secondary | ICD-10-CM | POA: Diagnosis not present

## 2022-06-05 DIAGNOSIS — M25561 Pain in right knee: Secondary | ICD-10-CM | POA: Diagnosis not present

## 2022-06-05 DIAGNOSIS — M546 Pain in thoracic spine: Secondary | ICD-10-CM | POA: Diagnosis not present

## 2022-06-05 DIAGNOSIS — Z9981 Dependence on supplemental oxygen: Secondary | ICD-10-CM | POA: Diagnosis not present

## 2022-06-05 DIAGNOSIS — J849 Interstitial pulmonary disease, unspecified: Secondary | ICD-10-CM | POA: Diagnosis not present

## 2022-06-05 DIAGNOSIS — Z87891 Personal history of nicotine dependence: Secondary | ICD-10-CM | POA: Diagnosis not present

## 2022-06-05 DIAGNOSIS — R413 Other amnesia: Secondary | ICD-10-CM | POA: Diagnosis not present

## 2022-06-05 DIAGNOSIS — L89322 Pressure ulcer of left buttock, stage 2: Secondary | ICD-10-CM | POA: Diagnosis not present

## 2022-06-05 DIAGNOSIS — K649 Unspecified hemorrhoids: Secondary | ICD-10-CM | POA: Diagnosis not present

## 2022-06-05 NOTE — Telephone Encounter (Signed)
[facility] was requesting orders for OT eval. Secure vm.

## 2022-06-06 NOTE — Telephone Encounter (Signed)
LMOM for Kevin Glass w/ verbal orders

## 2022-06-11 DIAGNOSIS — M7989 Other specified soft tissue disorders: Secondary | ICD-10-CM | POA: Diagnosis not present

## 2022-06-11 DIAGNOSIS — I4510 Unspecified right bundle-branch block: Secondary | ICD-10-CM | POA: Diagnosis not present

## 2022-06-11 DIAGNOSIS — N200 Calculus of kidney: Secondary | ICD-10-CM | POA: Diagnosis not present

## 2022-06-11 DIAGNOSIS — R000 Tachycardia, unspecified: Secondary | ICD-10-CM | POA: Diagnosis not present

## 2022-06-11 DIAGNOSIS — I959 Hypotension, unspecified: Secondary | ICD-10-CM | POA: Diagnosis not present

## 2022-06-11 DIAGNOSIS — R6520 Severe sepsis without septic shock: Secondary | ICD-10-CM | POA: Diagnosis not present

## 2022-06-11 DIAGNOSIS — A419 Sepsis, unspecified organism: Secondary | ICD-10-CM | POA: Diagnosis not present

## 2022-06-11 DIAGNOSIS — I440 Atrioventricular block, first degree: Secondary | ICD-10-CM | POA: Diagnosis not present

## 2022-06-11 DIAGNOSIS — I493 Ventricular premature depolarization: Secondary | ICD-10-CM | POA: Diagnosis not present

## 2022-06-11 DIAGNOSIS — I2489 Other forms of acute ischemic heart disease: Secondary | ICD-10-CM | POA: Diagnosis not present

## 2022-06-11 DIAGNOSIS — I10 Essential (primary) hypertension: Secondary | ICD-10-CM | POA: Diagnosis not present

## 2022-06-11 DIAGNOSIS — J9601 Acute respiratory failure with hypoxia: Secondary | ICD-10-CM | POA: Diagnosis not present

## 2022-06-11 DIAGNOSIS — R17 Unspecified jaundice: Secondary | ICD-10-CM | POA: Diagnosis not present

## 2022-06-11 DIAGNOSIS — I110 Hypertensive heart disease with heart failure: Secondary | ICD-10-CM | POA: Diagnosis not present

## 2022-06-11 DIAGNOSIS — Z87891 Personal history of nicotine dependence: Secondary | ICD-10-CM | POA: Diagnosis not present

## 2022-06-11 DIAGNOSIS — I444 Left anterior fascicular block: Secondary | ICD-10-CM | POA: Diagnosis not present

## 2022-06-11 DIAGNOSIS — R531 Weakness: Secondary | ICD-10-CM | POA: Diagnosis not present

## 2022-06-11 DIAGNOSIS — Z7409 Other reduced mobility: Secondary | ICD-10-CM | POA: Diagnosis not present

## 2022-06-11 DIAGNOSIS — I480 Paroxysmal atrial fibrillation: Secondary | ICD-10-CM | POA: Diagnosis not present

## 2022-06-11 DIAGNOSIS — G7000 Myasthenia gravis without (acute) exacerbation: Secondary | ICD-10-CM | POA: Diagnosis not present

## 2022-06-11 DIAGNOSIS — R7989 Other specified abnormal findings of blood chemistry: Secondary | ICD-10-CM | POA: Diagnosis not present

## 2022-06-11 DIAGNOSIS — D539 Nutritional anemia, unspecified: Secondary | ICD-10-CM | POA: Diagnosis not present

## 2022-06-11 DIAGNOSIS — I350 Nonrheumatic aortic (valve) stenosis: Secondary | ICD-10-CM | POA: Diagnosis not present

## 2022-06-11 DIAGNOSIS — L89151 Pressure ulcer of sacral region, stage 1: Secondary | ICD-10-CM | POA: Diagnosis not present

## 2022-06-11 DIAGNOSIS — I4891 Unspecified atrial fibrillation: Secondary | ICD-10-CM | POA: Diagnosis not present

## 2022-06-11 DIAGNOSIS — Z20822 Contact with and (suspected) exposure to covid-19: Secondary | ICD-10-CM | POA: Diagnosis not present

## 2022-06-11 DIAGNOSIS — Z7982 Long term (current) use of aspirin: Secondary | ICD-10-CM | POA: Diagnosis not present

## 2022-06-11 DIAGNOSIS — R6510 Systemic inflammatory response syndrome (SIRS) of non-infectious origin without acute organ dysfunction: Secondary | ICD-10-CM | POA: Diagnosis not present

## 2022-06-11 DIAGNOSIS — R0602 Shortness of breath: Secondary | ICD-10-CM | POA: Diagnosis not present

## 2022-06-11 DIAGNOSIS — I491 Atrial premature depolarization: Secondary | ICD-10-CM | POA: Diagnosis not present

## 2022-06-11 DIAGNOSIS — Z66 Do not resuscitate: Secondary | ICD-10-CM | POA: Diagnosis not present

## 2022-06-11 DIAGNOSIS — Z872 Personal history of diseases of the skin and subcutaneous tissue: Secondary | ICD-10-CM | POA: Diagnosis not present

## 2022-06-11 DIAGNOSIS — R2689 Other abnormalities of gait and mobility: Secondary | ICD-10-CM | POA: Diagnosis not present

## 2022-06-11 DIAGNOSIS — R509 Fever, unspecified: Secondary | ICD-10-CM | POA: Diagnosis not present

## 2022-06-11 DIAGNOSIS — I5023 Acute on chronic systolic (congestive) heart failure: Secondary | ICD-10-CM | POA: Diagnosis not present

## 2022-06-12 DIAGNOSIS — G7000 Myasthenia gravis without (acute) exacerbation: Secondary | ICD-10-CM | POA: Diagnosis not present

## 2022-06-12 DIAGNOSIS — R6510 Systemic inflammatory response syndrome (SIRS) of non-infectious origin without acute organ dysfunction: Secondary | ICD-10-CM | POA: Diagnosis not present

## 2022-06-12 DIAGNOSIS — L89151 Pressure ulcer of sacral region, stage 1: Secondary | ICD-10-CM | POA: Diagnosis not present

## 2022-06-12 DIAGNOSIS — I4891 Unspecified atrial fibrillation: Secondary | ICD-10-CM | POA: Diagnosis not present

## 2022-06-12 DIAGNOSIS — I493 Ventricular premature depolarization: Secondary | ICD-10-CM | POA: Diagnosis not present

## 2022-06-12 DIAGNOSIS — R0602 Shortness of breath: Secondary | ICD-10-CM | POA: Diagnosis not present

## 2022-06-12 DIAGNOSIS — J9601 Acute respiratory failure with hypoxia: Secondary | ICD-10-CM | POA: Diagnosis not present

## 2022-06-13 ENCOUNTER — Telehealth: Payer: Self-pay | Admitting: Family Medicine

## 2022-06-13 DIAGNOSIS — I10 Essential (primary) hypertension: Secondary | ICD-10-CM | POA: Diagnosis not present

## 2022-06-13 DIAGNOSIS — R17 Unspecified jaundice: Secondary | ICD-10-CM | POA: Diagnosis not present

## 2022-06-13 DIAGNOSIS — R6520 Severe sepsis without septic shock: Secondary | ICD-10-CM | POA: Diagnosis not present

## 2022-06-13 DIAGNOSIS — I350 Nonrheumatic aortic (valve) stenosis: Secondary | ICD-10-CM | POA: Diagnosis not present

## 2022-06-13 DIAGNOSIS — A419 Sepsis, unspecified organism: Secondary | ICD-10-CM | POA: Diagnosis not present

## 2022-06-13 DIAGNOSIS — G7000 Myasthenia gravis without (acute) exacerbation: Secondary | ICD-10-CM | POA: Diagnosis not present

## 2022-06-13 DIAGNOSIS — R7989 Other specified abnormal findings of blood chemistry: Secondary | ICD-10-CM | POA: Diagnosis not present

## 2022-06-13 DIAGNOSIS — L89151 Pressure ulcer of sacral region, stage 1: Secondary | ICD-10-CM | POA: Diagnosis not present

## 2022-06-13 NOTE — Telephone Encounter (Signed)
FYI

## 2022-06-13 NOTE — Telephone Encounter (Signed)
[facility]) called stating pt had a missed [place] visit due to being hospitalized.

## 2022-06-14 DIAGNOSIS — L89151 Pressure ulcer of sacral region, stage 1: Secondary | ICD-10-CM | POA: Diagnosis not present

## 2022-06-14 DIAGNOSIS — G7000 Myasthenia gravis without (acute) exacerbation: Secondary | ICD-10-CM | POA: Diagnosis not present

## 2022-06-14 DIAGNOSIS — I350 Nonrheumatic aortic (valve) stenosis: Secondary | ICD-10-CM | POA: Diagnosis not present

## 2022-06-14 DIAGNOSIS — I10 Essential (primary) hypertension: Secondary | ICD-10-CM | POA: Diagnosis not present

## 2022-06-14 DIAGNOSIS — A419 Sepsis, unspecified organism: Secondary | ICD-10-CM | POA: Diagnosis not present

## 2022-06-14 DIAGNOSIS — R17 Unspecified jaundice: Secondary | ICD-10-CM | POA: Diagnosis not present

## 2022-06-14 DIAGNOSIS — R6520 Severe sepsis without septic shock: Secondary | ICD-10-CM | POA: Diagnosis not present

## 2022-06-14 DIAGNOSIS — R7989 Other specified abnormal findings of blood chemistry: Secondary | ICD-10-CM | POA: Diagnosis not present

## 2022-06-15 DIAGNOSIS — L89151 Pressure ulcer of sacral region, stage 1: Secondary | ICD-10-CM | POA: Diagnosis not present

## 2022-06-15 DIAGNOSIS — A419 Sepsis, unspecified organism: Secondary | ICD-10-CM | POA: Diagnosis not present

## 2022-06-15 DIAGNOSIS — R2689 Other abnormalities of gait and mobility: Secondary | ICD-10-CM | POA: Diagnosis not present

## 2022-06-15 DIAGNOSIS — R17 Unspecified jaundice: Secondary | ICD-10-CM | POA: Diagnosis not present

## 2022-06-15 DIAGNOSIS — R7989 Other specified abnormal findings of blood chemistry: Secondary | ICD-10-CM | POA: Diagnosis not present

## 2022-06-15 DIAGNOSIS — I10 Essential (primary) hypertension: Secondary | ICD-10-CM | POA: Diagnosis not present

## 2022-06-15 DIAGNOSIS — G7000 Myasthenia gravis without (acute) exacerbation: Secondary | ICD-10-CM | POA: Diagnosis not present

## 2022-06-15 DIAGNOSIS — R6520 Severe sepsis without septic shock: Secondary | ICD-10-CM | POA: Diagnosis not present

## 2022-06-15 DIAGNOSIS — R0602 Shortness of breath: Secondary | ICD-10-CM | POA: Diagnosis not present

## 2022-06-15 DIAGNOSIS — I350 Nonrheumatic aortic (valve) stenosis: Secondary | ICD-10-CM | POA: Diagnosis not present

## 2022-06-26 ENCOUNTER — Telehealth (INDEPENDENT_AMBULATORY_CARE_PROVIDER_SITE_OTHER): Payer: Medicare Other | Admitting: Family Medicine

## 2022-06-26 DIAGNOSIS — Z862 Personal history of diseases of the blood and blood-forming organs and certain disorders involving the immune mechanism: Secondary | ICD-10-CM

## 2022-06-26 DIAGNOSIS — R600 Localized edema: Secondary | ICD-10-CM

## 2022-06-26 DIAGNOSIS — E876 Hypokalemia: Secondary | ICD-10-CM | POA: Diagnosis not present

## 2022-06-26 DIAGNOSIS — Z8709 Personal history of other diseases of the respiratory system: Secondary | ICD-10-CM | POA: Diagnosis not present

## 2022-06-26 DIAGNOSIS — A419 Sepsis, unspecified organism: Secondary | ICD-10-CM

## 2022-06-26 DIAGNOSIS — J189 Pneumonia, unspecified organism: Secondary | ICD-10-CM

## 2022-06-26 MED ORDER — POTASSIUM CHLORIDE CRYS ER 10 MEQ PO TBCR: 10.0000 meq | EXTENDED_RELEASE_TABLET | Freq: Two times a day (BID) | ORAL | 1 refills | Status: DC

## 2022-06-26 MED ORDER — POTASSIUM CHLORIDE CRYS ER 10 MEQ PO TBCR: 10.0000 meq | EXTENDED_RELEASE_TABLET | Freq: Two times a day (BID) | ORAL | Status: DC

## 2022-06-26 MED ORDER — FUROSEMIDE 20 MG PO TABS: ORAL_TABLET | ORAL | 3 refills | Status: DC

## 2022-06-26 NOTE — Progress Notes (Addendum)
MyChart Video Visit    Virtual Visit via Video Note   This visit type was conducted due to national recommendations for restrictions regarding the COVID-19 Pandemic (e.g. social distancing) in an effort to limit this patient's exposure and mitigate transmission in our community. This patient is at least at moderate risk for complications without adequate follow up. This format is felt to be most appropriate for this patient at this time. Physical exam was limited by quality of the video and audio technology used for the visit. Nira Conn  was able to get the patient set up on a video visit.  Patient location: Home Patient and provider in visit Provider location: Office  I discussed the limitations of evaluation and management by telemedicine and the availability of in person appointments. The patient expressed understanding and agreed to proceed.  Visit Date: 06/26/2022  Today's healthcare provider: Ann Held, DO     Subjective:    Patient ID: Kevin Glass, male    DOB: 1923/04/09, 89 y.o.   MRN: [id number]  Chief Complaint  Patient presents with   Hospitalization Follow-up    Pneumonia and sepsis    HPI Patient is in today for video visit. Patients daughter is accompanying him to assist with communication.   Patient was admitted to he hospital on 06/11/2022 for sepsis and pneumonia. Patients daughter reports he was instructed to receive physical therapy but no one called or showed. She is facilitating exercises for patient. Patients daughter is regularly changing bandages for sores on patients buttocks. Patients daughter reports the hospital doctors found he has decreased function in one of his heart valves. Since then patients daughter is reducing salt in her diet. She is also weighing him every morning. He was given 20 mg lasix and 10 meq potassium supplements.     Past Medical History:  Diagnosis Date   Acid reflux    ARTHRITIS 08/26/2007   Qualifier:  Diagnosis of  By: Ronnald Ramp RN, CGRN, Sheri     Balance problem 06/13/2019   BPH (benign prostatic hyperplasia) 02/23/2007   Qualifier: Diagnosis of  By: Heath Lark     Colonic polyp    COLONIC POLYPS 05/04/2007   Qualifier: Diagnosis of  By: Ronnald Ramp RN, CGRN, Sheri     Constipation 09/18/2014   DIVERTICULOSIS, COLON 08/26/2007   Qualifier: Diagnosis of  By: Ronnald Ramp RN, CGRN, Sheri     Dizziness and giddiness 12/18/2009   Qualifier: Diagnosis of  By: Larose Kells MD, [place]    Dyspepsia 04/09/2016   Dyspnea on exertion 03/08/2021   Finger pain, right 06/13/2019   Gastroesophageal reflux disease 12/26/2019   Height loss 06/13/2019   HEMORRHOIDS 08/26/2007   Qualifier: Diagnosis of  By: Ronnald Ramp RN, CGRN, Sheri     IMPAIRED GLUCOSE TOLERANCE 02/23/2007   Qualifier: Diagnosis of  By: Heath Lark     Lower extremity edema 09/14/2020   Neck pain 06/13/2019   NONSPECIFIC ABNORMAL ELECTROCARDIOGRAM 06/27/2009   Qualifier: Diagnosis of  By: Larose Kells MD, [place]    Obesity (BMI 30-39.9) 05/27/2013   Post-nasal drainage 10/28/2018   Last Assessment & Plan:  Formatting of this note might be different from the original. Concern over rhinorrhea. Significant anterior rhinorrhea.  Daily.  Ipratropium helped but when he stopped it his symptoms recurred. EXAMINATION shows no intranasal polyps or purulence. PLAN: We discussed vasomotor rhinitis in detail we never cure it.  If he wants to control the symptoms he has to use the [employer]  Pressure injury of right buttock, stage 1 12/26/2019   Preventative health care 04/05/2014   Wellness labs, vaccines, procedures are up to date will only order future lipid panel. Pt encouraged to get hearing aid through [place].  Also flu vaccine given today.   Right knee pain 09/21/2014   Tendinopathy of rotator cuff 01/02/2015   Thoracic spine pain 06/13/2019   Urinary frequency 12/26/2019   Vertical diplopia    Viral URI 11/21/2010    Past Surgical History:  Procedure Laterality Date   HEMORRHOID SURGERY      HERNIA REPAIR  1994   x 2   TOTAL KNEE ARTHROPLASTY  2003   Dr Maureen Ralphs    Family History  Problem Relation Age of Onset   Cancer Mother    Parkinson's disease Father    Arthritis Sister     Social History   Socioeconomic History   Marital status: Widowed    Spouse name: Not on file   Number of children: 3   Years of education: 29   Highest education level: Not on file  Occupational History   Occupation: retired  Tobacco Use   Smoking status: Former    Types: Cigarettes    Quit date: 06/16/1984    Years since quitting: 38.0   Smokeless tobacco: Never  Vaping Use   Vaping Use: Never used  Substance and Sexual Activity   Alcohol use: No    Alcohol/week: 0.0 standard drinks of alcohol    Comment: quit 1986   Drug use: No   Sexual activity: Yes    Partners: Female  Other Topics Concern   Not on file  Social History Narrative   111/1/21ives with daughter Gae Bon   No caffeine   Exercise--[employer] twice a week and walks dog   Social Determinants of Health   Financial Resource Strain: Low Risk  (12/26/2019)   Overall Financial Resource Strain (CARDIA)    Difficulty of Paying Living Expenses: Not hard at all  Food Insecurity: No Food Insecurity (12/26/2019)   Hunger Vital Sign    Worried About Running Out of Food in the Last Year: Never true    [place] in the Last Year: Never true  Transportation Needs: No Transportation Needs (12/26/2019)   PRAPARE - Hydrologist (Medical): No    Lack of Transportation (Non-Medical): No  Physical Activity: Not on file  Stress: Not on file  Social Connections: Not on file  Intimate Partner Violence: Not on file    Outpatient Medications Prior to Visit  Medication Sig Dispense Refill   Ascorbic Acid (VITAMIN C) 500 MG tablet Take 1,000 mg by mouth daily.     aspirin 81 MG tablet Take 81 mg by mouth every other day.     azelastine (ASTELIN) 0.1 % nasal spray Place 2 sprays into both nostrils 2  (two) times daily. 30 mL 6   Cholecalciferol (VITAMIN D) 2000 UNITS CAPS Take 1 capsule by mouth daily.     ipratropium (ATROVENT) 0.06 % nasal spray Place 1 spray into both nostrils as needed for allergies or rhinitis.     lactulose (CHRONULAC) 10 GM/15ML solution Take 30 mLs (20 g total) by mouth 2 (two) times daily as needed for moderate constipation. 1800 mL 1   Omega-3 Fatty Acids (FISH OIL) 1200 MG CAPS Take 1,200 mg by mouth daily.     omeprazole (PRILOSEC) 20 MG capsule TAKE 1 CAPSULE BY MOUTH DAILY  (PLEASE CALL OFFICE TO  SCHEDULE  FOLLOW UP) 90 capsule 0   pyridostigmine (MESTINON) 60 MG tablet Take 1 tablet (60 mg total) by mouth 3 (three) times daily. 270 tablet 3   Saw Palmetto 500 MG CAPS Take 1 capsule by mouth every other day.     solifenacin (VESICARE) 5 MG tablet Take 1 tablet (5 mg total) by mouth daily. 90 tablet 0   No facility-administered medications prior to visit.    Allergies  Allergen Reactions   Penicillins Rash    White blister on the skin     Review of Systems  Constitutional:  Negative for fever and malaise/fatigue.  HENT:  Negative for congestion.   Eyes:  Negative for blurred vision.  Respiratory:  Negative for shortness of breath.   Cardiovascular:  Negative for chest pain, palpitations and leg swelling.  Gastrointestinal:  Negative for abdominal pain, blood in stool and nausea.  Genitourinary:  Negative for dysuria and frequency.  Musculoskeletal:  Negative for falls.  Skin:  Negative for rash.  Neurological:  Negative for dizziness, loss of consciousness and headaches.  Endo/Heme/Allergies:  Negative for environmental allergies.  Psychiatric/Behavioral:  Negative for depression. The patient is not nervous/anxious.        Objective:    Physical Exam Vitals and nursing note reviewed.  Constitutional:      General: He is not in acute distress.    Appearance: He is not ill-appearing or toxic-appearing.  Pulmonary:     Effort: Pulmonary  effort is normal.  Neurological:     Mental Status: He is alert.  Psychiatric:        Mood and Affect: Mood normal.        Behavior: Behavior normal.        Thought Content: Thought content normal.        Judgment: Judgment normal.    There were no vitals taken for this visit. Wt Readings from Last 3 Encounters:  05/26/22 206 lb 12.8 oz (93.8 kg)  04/02/22 201 lb (91.2 kg)  09/03/21 198 lb 12.8 oz (90.2 kg)       Assessment & Plan:  Pneumonia due to infectious organism, unspecified laterality, unspecified part of lung Assessment & Plan: Pt is much improved Feeling good and is at his baseline    Hypokalemia -     Potassium Chloride [facility] ER; Take 1 tablet (10 mEq total) by mouth 2 (two) times daily.  Dispense: 90 tablet; Refill: 1  Lower extremity edema -     Furosemide; 1 po every other day  Dispense: 30 tablet; Refill: 3  Sepsis due to pneumonia [facility]) Assessment & Plan: Pt is much improved Feeling good and is at his baseline       I discussed the assessment and treatment plan with the patient. The patient was provided an opportunity to ask questions and all were answered. The patient agreed with the plan and demonstrated an understanding of the instructions.   The patient was advised to call back or seek an in-person evaluation if the symptoms worsen or if the condition fails to improve as anticipated.  Ann Held, DO [place] [facility] [phone] (phone) [phone] (fax)  [place]    I,Shehryar Baig,acting as a scribe for Ann Held, DO.,have documented all relevant documentation on the behalf of Ann Held, DO,as directed by  Ann Held, DO while in the presence of Ann Held, DO.

## 2022-06-29 ENCOUNTER — Encounter: Payer: Self-pay | Admitting: Family Medicine

## 2022-06-29 DIAGNOSIS — A419 Sepsis, unspecified organism: Secondary | ICD-10-CM | POA: Insufficient documentation

## 2022-06-29 NOTE — Assessment & Plan Note (Signed)
Pt is much improved Feeling good and is at his baseline

## 2022-07-02 ENCOUNTER — Telehealth: Payer: Self-pay | Admitting: Family Medicine

## 2022-07-02 NOTE — Telephone Encounter (Signed)
Kevin Glass from [facility] stated they will be discharging pt from hh care per the daughter's request.

## 2022-07-04 DIAGNOSIS — J849 Interstitial pulmonary disease, unspecified: Secondary | ICD-10-CM | POA: Diagnosis not present

## 2022-07-04 DIAGNOSIS — J479 Bronchiectasis, uncomplicated: Secondary | ICD-10-CM | POA: Diagnosis not present

## 2022-07-05 ENCOUNTER — Encounter: Payer: Self-pay | Admitting: Family Medicine

## 2022-07-05 DIAGNOSIS — L89301 Pressure ulcer of unspecified buttock, stage 1: Secondary | ICD-10-CM

## 2022-07-09 NOTE — Telephone Encounter (Signed)
We have a phone note on 01/17 that the patient's daughter wanted him discharged from home health

## 2022-07-14 ENCOUNTER — Other Ambulatory Visit: Payer: Self-pay | Admitting: Family Medicine

## 2022-07-17 NOTE — Telephone Encounter (Signed)
New referral placed.

## 2022-07-28 IMAGING — US US SOFT TISSUE HEAD/NECK
1 series · 6 of 6 positions shown · non-contrast
Comparison: None.

CLINICAL DATA: Palpable mass adjacent to the right ear

EXAM:
ULTRASOUND OF HEAD/NECK SOFT TISSUES
TECHNIQUE: Ultrasound examination of the head and neck soft tissues was
performed in the area of clinical concern.

[Series 2: us soft tissue head/neck · 6 of 6 slices shown]
[im 1/6]
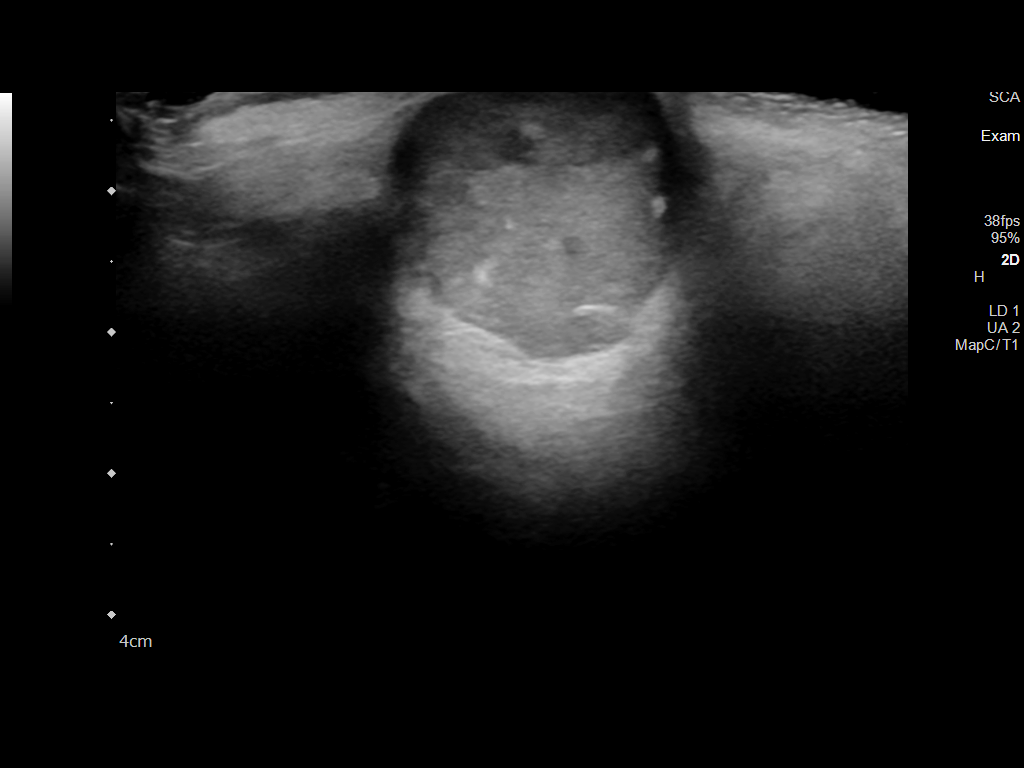
[im 2/6]
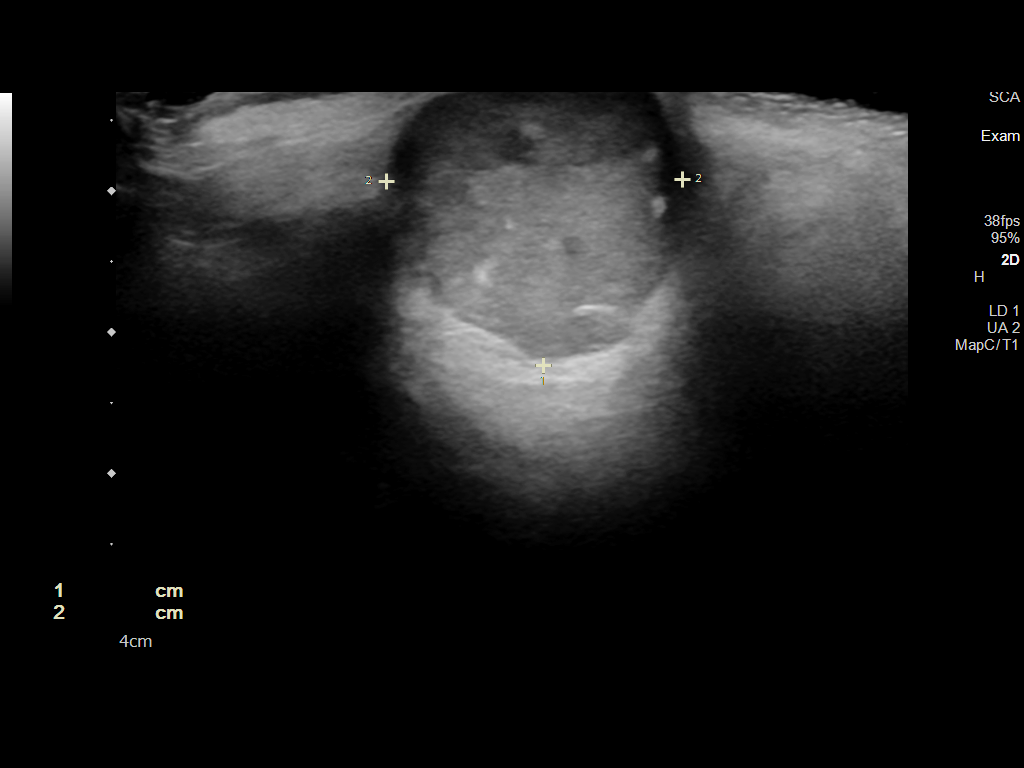
[im 3/6]
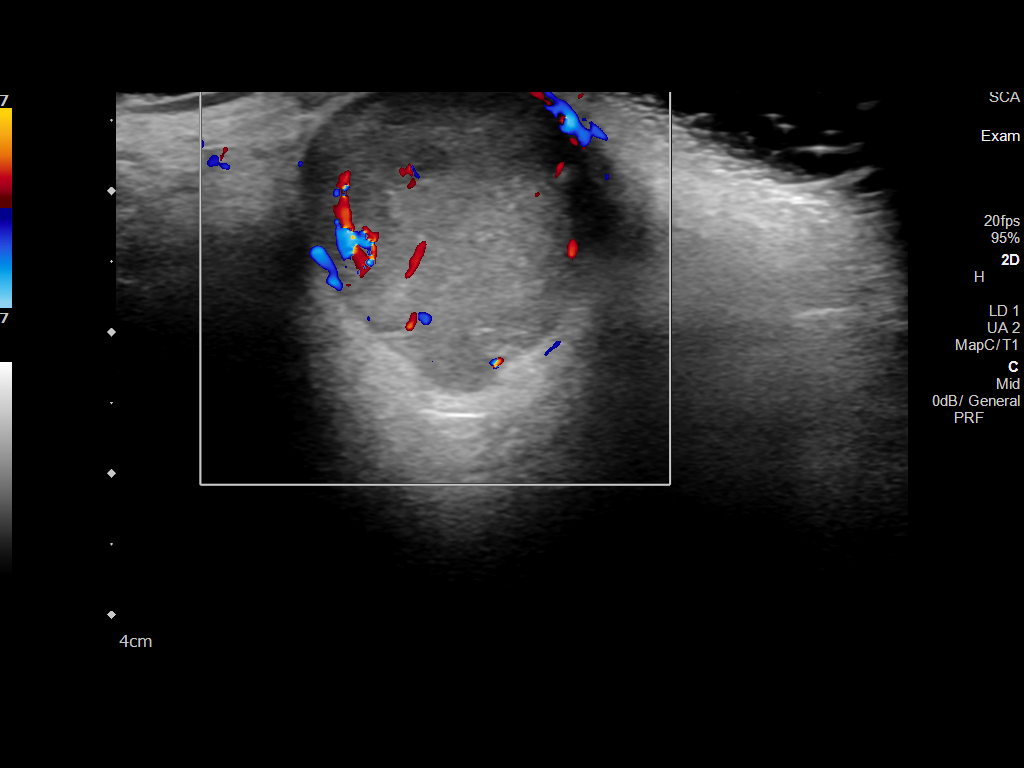
[im 4/6]
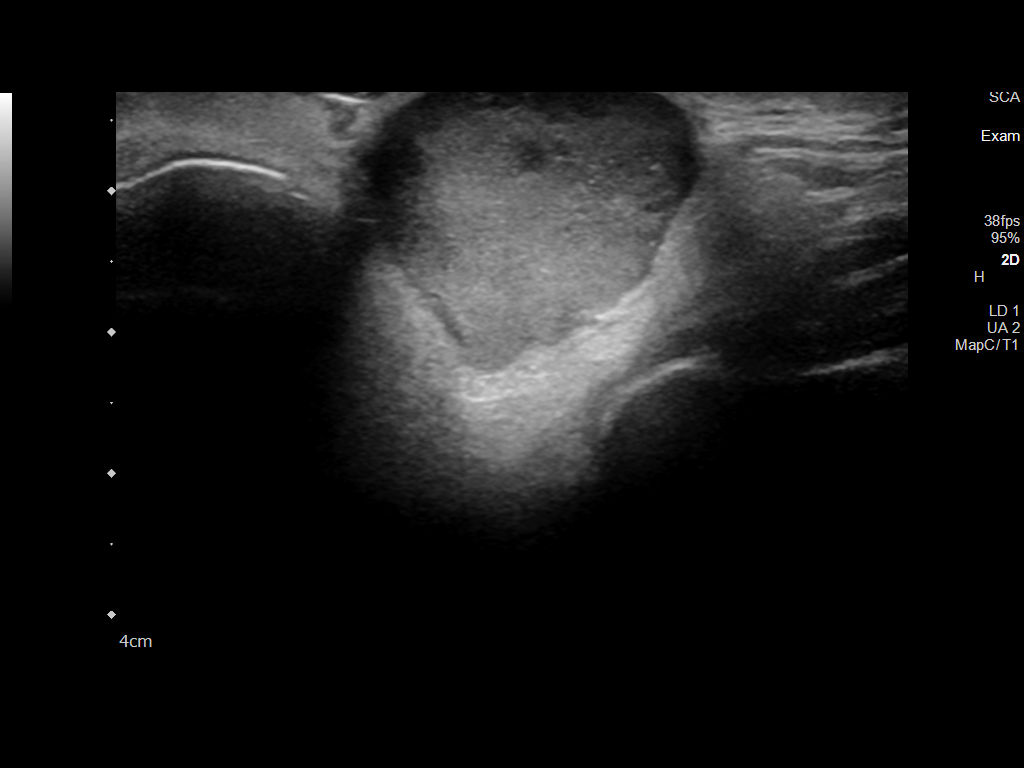
[im 5/6]
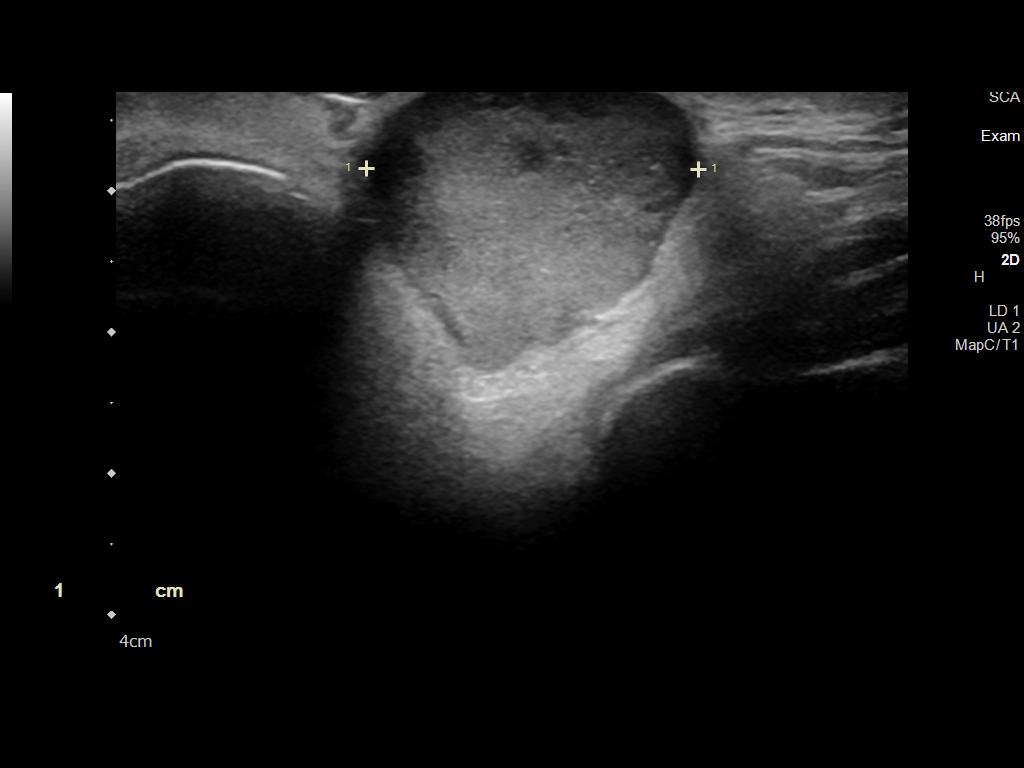
[im 6/6]
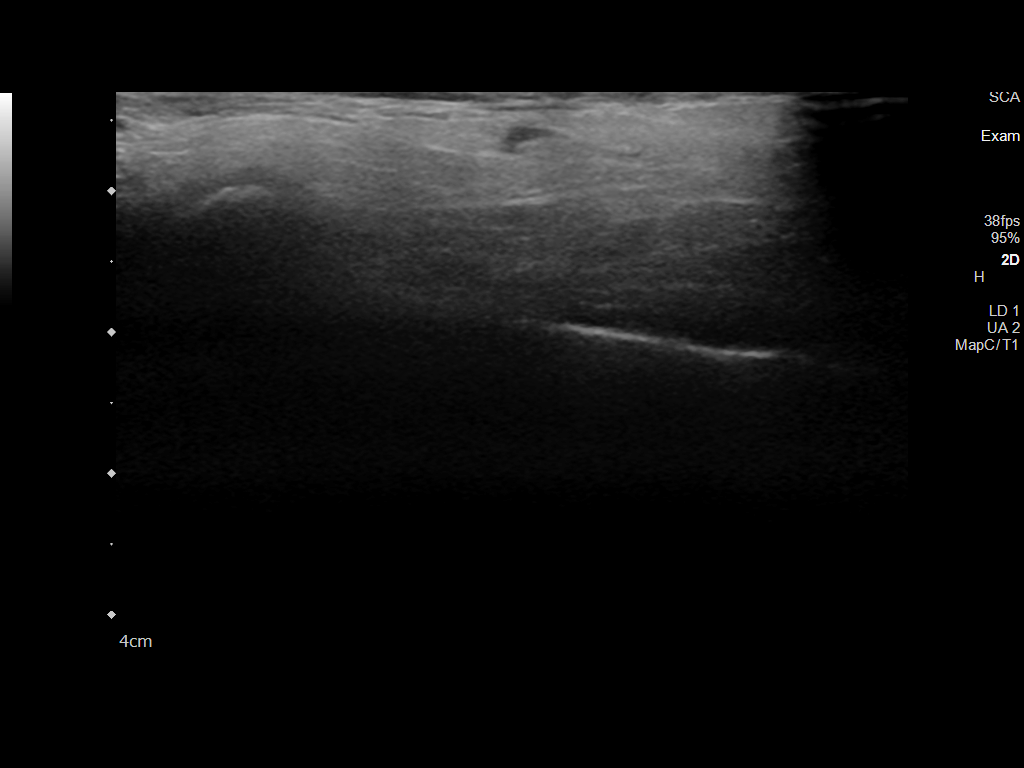

[6 of 6 positions shown; findings below may reference images not displayed]

FINDINGS: Sonographic interrogation of the region of clinical concern
demonstrates a superficial mildly macrolobulated soft tissue mass
measuring 2.0 x 2.1 x 2.4 cm. On color Doppler imaging there is
internal evidence of vascularity. No convincing morphologic features
to suggest a normal or reactive lymph node.
IMPRESSION: The palpable abnormality corresponds with a 2.4 cm macrolobulated
solid soft tissue mass in the superficial subcutaneous fat medial to
the right ear with evidence of internal vascularity.

Differential considerations include metastatic lymphadenopathy
versus other soft tissue neoplasm or malignancy. Consider tissue
sampling for further diagnosis.

## 2022-08-04 DIAGNOSIS — J479 Bronchiectasis, uncomplicated: Secondary | ICD-10-CM | POA: Diagnosis not present

## 2022-08-04 DIAGNOSIS — J849 Interstitial pulmonary disease, unspecified: Secondary | ICD-10-CM | POA: Diagnosis not present

## 2022-09-02 DIAGNOSIS — J849 Interstitial pulmonary disease, unspecified: Secondary | ICD-10-CM | POA: Diagnosis not present

## 2022-09-02 DIAGNOSIS — J479 Bronchiectasis, uncomplicated: Secondary | ICD-10-CM | POA: Diagnosis not present

## 2022-09-23 ENCOUNTER — Other Ambulatory Visit: Payer: Self-pay | Admitting: Family Medicine

## 2022-09-23 DIAGNOSIS — E876 Hypokalemia: Secondary | ICD-10-CM

## 2022-09-26 ENCOUNTER — Other Ambulatory Visit: Payer: Self-pay | Admitting: Family Medicine

## 2022-10-03 DIAGNOSIS — J479 Bronchiectasis, uncomplicated: Secondary | ICD-10-CM | POA: Diagnosis not present

## 2022-10-03 DIAGNOSIS — J849 Interstitial pulmonary disease, unspecified: Secondary | ICD-10-CM | POA: Diagnosis not present

## 2022-11-02 DIAGNOSIS — J479 Bronchiectasis, uncomplicated: Secondary | ICD-10-CM | POA: Diagnosis not present

## 2022-11-02 DIAGNOSIS — J849 Interstitial pulmonary disease, unspecified: Secondary | ICD-10-CM | POA: Diagnosis not present

## 2022-11-07 ENCOUNTER — Encounter: Payer: Self-pay | Admitting: Family Medicine

## 2022-11-25 ENCOUNTER — Other Ambulatory Visit: Payer: Self-pay | Admitting: Family Medicine

## 2022-12-03 DIAGNOSIS — J479 Bronchiectasis, uncomplicated: Secondary | ICD-10-CM | POA: Diagnosis not present

## 2022-12-03 DIAGNOSIS — J849 Interstitial pulmonary disease, unspecified: Secondary | ICD-10-CM | POA: Diagnosis not present

## 2023-01-02 DIAGNOSIS — J479 Bronchiectasis, uncomplicated: Secondary | ICD-10-CM | POA: Diagnosis not present

## 2023-01-02 DIAGNOSIS — J849 Interstitial pulmonary disease, unspecified: Secondary | ICD-10-CM | POA: Diagnosis not present

## 2023-01-27 ENCOUNTER — Other Ambulatory Visit: Payer: Self-pay | Admitting: Family Medicine

## 2023-01-30 DIAGNOSIS — I7143 Infrarenal abdominal aortic aneurysm, without rupture: Secondary | ICD-10-CM | POA: Diagnosis not present

## 2023-01-30 DIAGNOSIS — L89323 Pressure ulcer of left buttock, stage 3: Secondary | ICD-10-CM | POA: Diagnosis not present

## 2023-01-30 DIAGNOSIS — K921 Melena: Secondary | ICD-10-CM | POA: Diagnosis not present

## 2023-01-30 DIAGNOSIS — I5023 Acute on chronic systolic (congestive) heart failure: Secondary | ICD-10-CM | POA: Diagnosis not present

## 2023-01-30 DIAGNOSIS — I493 Ventricular premature depolarization: Secondary | ICD-10-CM | POA: Diagnosis not present

## 2023-01-30 DIAGNOSIS — R918 Other nonspecific abnormal finding of lung field: Secondary | ICD-10-CM | POA: Diagnosis not present

## 2023-01-30 DIAGNOSIS — I714 Abdominal aortic aneurysm, without rupture, unspecified: Secondary | ICD-10-CM | POA: Diagnosis not present

## 2023-01-30 DIAGNOSIS — K219 Gastro-esophageal reflux disease without esophagitis: Secondary | ICD-10-CM | POA: Diagnosis not present

## 2023-01-30 DIAGNOSIS — L89321 Pressure ulcer of left buttock, stage 1: Secondary | ICD-10-CM | POA: Diagnosis not present

## 2023-01-30 DIAGNOSIS — Z66 Do not resuscitate: Secondary | ICD-10-CM | POA: Diagnosis not present

## 2023-01-30 DIAGNOSIS — Z7982 Long term (current) use of aspirin: Secondary | ICD-10-CM | POA: Diagnosis not present

## 2023-01-30 DIAGNOSIS — Z79899 Other long term (current) drug therapy: Secondary | ICD-10-CM | POA: Diagnosis not present

## 2023-01-30 DIAGNOSIS — K4090 Unilateral inguinal hernia, without obstruction or gangrene, not specified as recurrent: Secondary | ICD-10-CM | POA: Diagnosis not present

## 2023-01-30 DIAGNOSIS — L89313 Pressure ulcer of right buttock, stage 3: Secondary | ICD-10-CM | POA: Diagnosis not present

## 2023-01-30 DIAGNOSIS — J9621 Acute and chronic respiratory failure with hypoxia: Secondary | ICD-10-CM | POA: Diagnosis not present

## 2023-01-30 DIAGNOSIS — G7000 Myasthenia gravis without (acute) exacerbation: Secondary | ICD-10-CM | POA: Diagnosis not present

## 2023-01-30 DIAGNOSIS — K5730 Diverticulosis of large intestine without perforation or abscess without bleeding: Secondary | ICD-10-CM | POA: Diagnosis not present

## 2023-01-30 DIAGNOSIS — L89312 Pressure ulcer of right buttock, stage 2: Secondary | ICD-10-CM | POA: Diagnosis not present

## 2023-01-30 DIAGNOSIS — K625 Hemorrhage of anus and rectum: Secondary | ICD-10-CM | POA: Diagnosis not present

## 2023-01-30 DIAGNOSIS — J849 Interstitial pulmonary disease, unspecified: Secondary | ICD-10-CM | POA: Diagnosis not present

## 2023-01-30 DIAGNOSIS — Z743 Need for continuous supervision: Secondary | ICD-10-CM | POA: Diagnosis not present

## 2023-01-30 DIAGNOSIS — K922 Gastrointestinal hemorrhage, unspecified: Secondary | ICD-10-CM | POA: Diagnosis not present

## 2023-01-30 DIAGNOSIS — Z515 Encounter for palliative care: Secondary | ICD-10-CM | POA: Diagnosis not present

## 2023-01-30 DIAGNOSIS — K8689 Other specified diseases of pancreas: Secondary | ICD-10-CM | POA: Diagnosis not present

## 2023-01-30 DIAGNOSIS — I480 Paroxysmal atrial fibrillation: Secondary | ICD-10-CM | POA: Diagnosis not present

## 2023-01-30 DIAGNOSIS — I440 Atrioventricular block, first degree: Secondary | ICD-10-CM | POA: Diagnosis not present

## 2023-01-30 DIAGNOSIS — I7090 Unspecified atherosclerosis: Secondary | ICD-10-CM | POA: Diagnosis not present

## 2023-01-30 DIAGNOSIS — R0602 Shortness of breath: Secondary | ICD-10-CM | POA: Diagnosis not present

## 2023-01-30 DIAGNOSIS — J479 Bronchiectasis, uncomplicated: Secondary | ICD-10-CM | POA: Diagnosis not present

## 2023-01-30 DIAGNOSIS — I082 Rheumatic disorders of both aortic and tricuspid valves: Secondary | ICD-10-CM | POA: Diagnosis not present

## 2023-01-30 DIAGNOSIS — Z87891 Personal history of nicotine dependence: Secondary | ICD-10-CM | POA: Diagnosis not present

## 2023-01-30 DIAGNOSIS — N2889 Other specified disorders of kidney and ureter: Secondary | ICD-10-CM | POA: Diagnosis not present

## 2023-02-02 ENCOUNTER — Telehealth: Payer: Self-pay | Admitting: Family Medicine

## 2023-02-02 DIAGNOSIS — J849 Interstitial pulmonary disease, unspecified: Secondary | ICD-10-CM | POA: Diagnosis not present

## 2023-02-02 DIAGNOSIS — J479 Bronchiectasis, uncomplicated: Secondary | ICD-10-CM | POA: Diagnosis not present

## 2023-02-02 NOTE — Telephone Encounter (Signed)
[place] from [facility] called & stated she has faxed a document for an oxygen order for the pt three times on the dates 8/8, 8/16, & 8/19. She would like to know the status of when the order can be sent back to her. Please call & advise at [phone] Extension [phone].

## 2023-02-03 NOTE — Telephone Encounter (Signed)
Order received. Placed in folder for sig

## 2023-04-08 ENCOUNTER — Ambulatory Visit: Payer: Medicare Other | Admitting: Adult Health

## 2023-05-17 DEATH — deceased
